# Patient Record
Sex: Female | Born: 1935
Health system: Southern US, Community
[De-identification: ages and names within clinical notes are randomized; demographics above are authoritative.]

## PROBLEM LIST (undated history)

## (undated) DIAGNOSIS — Z85828 Personal history of other malignant neoplasm of skin: Secondary | ICD-10-CM

## (undated) DIAGNOSIS — M1990 Unspecified osteoarthritis, unspecified site: Secondary | ICD-10-CM

## (undated) DIAGNOSIS — E785 Hyperlipidemia, unspecified: Secondary | ICD-10-CM

## (undated) DIAGNOSIS — N390 Urinary tract infection, site not specified: Secondary | ICD-10-CM

## (undated) DIAGNOSIS — N809 Endometriosis, unspecified: Secondary | ICD-10-CM

## (undated) DIAGNOSIS — R944 Abnormal results of kidney function studies: Secondary | ICD-10-CM

## (undated) DIAGNOSIS — L409 Psoriasis, unspecified: Secondary | ICD-10-CM

## (undated) DIAGNOSIS — M069 Rheumatoid arthritis, unspecified: Secondary | ICD-10-CM

## (undated) DIAGNOSIS — I7300 Raynaud's syndrome without gangrene: Secondary | ICD-10-CM

## (undated) DIAGNOSIS — I82409 Acute embolism and thrombosis of unspecified deep veins of unspecified lower extremity: Secondary | ICD-10-CM

## (undated) DIAGNOSIS — I10 Essential (primary) hypertension: Secondary | ICD-10-CM

## (undated) HISTORY — DX: Endometriosis, unspecified: N80.9

## (undated) HISTORY — DX: Rheumatoid arthritis, unspecified: M06.9

## (undated) HISTORY — PX: APPENDECTOMY: SHX54

## (undated) HISTORY — DX: Psoriasis, unspecified: L40.9

## (undated) HISTORY — PX: OTHER SURGICAL HISTORY: SHX169

## (undated) HISTORY — PX: COLONOSCOPY: SHX174

## (undated) HISTORY — PX: TOTAL ABDOMINAL HYSTERECTOMY: SHX209

## (undated) HISTORY — DX: Raynaud's syndrome without gangrene: I73.00

## (undated) HISTORY — DX: Urinary tract infection, site not specified: N39.0

## (undated) HISTORY — DX: Essential (primary) hypertension: I10

## (undated) HISTORY — DX: Hyperlipidemia, unspecified: E78.5

## (undated) HISTORY — DX: Personal history of other malignant neoplasm of skin: Z85.828

## (undated) HISTORY — DX: Abnormal results of kidney function studies: R94.4

## (undated) HISTORY — PX: TONSILLECTOMY: SUR1361

## (undated) HISTORY — PX: CATARACT EXTRACTION: SUR2

## (undated) HISTORY — DX: Unspecified osteoarthritis, unspecified site: M19.90

---

## 1998-12-16 ENCOUNTER — Other Ambulatory Visit: Admission: RE | Admit: 1998-12-16 | Discharge: 1998-12-16 | Payer: Self-pay | Admitting: Obstetrics and Gynecology

## 1999-08-31 ENCOUNTER — Encounter: Payer: Self-pay | Admitting: Obstetrics and Gynecology

## 1999-08-31 ENCOUNTER — Encounter: Admission: RE | Admit: 1999-08-31 | Discharge: 1999-08-31 | Payer: Self-pay | Admitting: Obstetrics and Gynecology

## 2000-04-01 ENCOUNTER — Other Ambulatory Visit: Admission: RE | Admit: 2000-04-01 | Discharge: 2000-04-01 | Payer: Self-pay | Admitting: Obstetrics and Gynecology

## 2001-03-10 ENCOUNTER — Encounter: Admission: RE | Admit: 2001-03-10 | Discharge: 2001-03-10 | Payer: Self-pay | Admitting: Obstetrics and Gynecology

## 2001-03-10 ENCOUNTER — Encounter: Payer: Self-pay | Admitting: Obstetrics and Gynecology

## 2001-05-07 ENCOUNTER — Other Ambulatory Visit: Admission: RE | Admit: 2001-05-07 | Discharge: 2001-05-07 | Payer: Self-pay | Admitting: Obstetrics and Gynecology

## 2002-05-13 ENCOUNTER — Encounter: Payer: Self-pay | Admitting: Internal Medicine

## 2002-05-13 ENCOUNTER — Encounter: Admission: RE | Admit: 2002-05-13 | Discharge: 2002-05-13 | Payer: Self-pay | Admitting: Internal Medicine

## 2002-11-12 LAB — HM COLONOSCOPY

## 2003-06-24 ENCOUNTER — Encounter: Payer: Self-pay | Admitting: Obstetrics and Gynecology

## 2003-06-24 ENCOUNTER — Encounter: Admission: RE | Admit: 2003-06-24 | Discharge: 2003-06-24 | Payer: Self-pay | Admitting: Obstetrics and Gynecology

## 2004-08-14 ENCOUNTER — Encounter: Admission: RE | Admit: 2004-08-14 | Discharge: 2004-08-14 | Payer: Self-pay | Admitting: Obstetrics and Gynecology

## 2004-09-22 ENCOUNTER — Ambulatory Visit: Payer: Self-pay | Admitting: Internal Medicine

## 2004-10-31 ENCOUNTER — Ambulatory Visit: Payer: Self-pay | Admitting: Internal Medicine

## 2004-12-11 ENCOUNTER — Ambulatory Visit: Payer: Self-pay | Admitting: Internal Medicine

## 2004-12-12 ENCOUNTER — Ambulatory Visit: Payer: Self-pay

## 2004-12-25 ENCOUNTER — Encounter: Admission: RE | Admit: 2004-12-25 | Discharge: 2004-12-25 | Payer: Self-pay | Admitting: Internal Medicine

## 2005-08-23 ENCOUNTER — Ambulatory Visit: Payer: Self-pay | Admitting: Internal Medicine

## 2005-12-14 ENCOUNTER — Encounter: Admission: RE | Admit: 2005-12-14 | Discharge: 2005-12-14 | Payer: Self-pay | Admitting: Obstetrics and Gynecology

## 2005-12-31 ENCOUNTER — Ambulatory Visit: Payer: Self-pay | Admitting: Internal Medicine

## 2006-01-04 ENCOUNTER — Encounter: Admission: RE | Admit: 2006-01-04 | Discharge: 2006-01-04 | Payer: Self-pay | Admitting: Obstetrics and Gynecology

## 2006-09-04 ENCOUNTER — Ambulatory Visit: Payer: Self-pay | Admitting: Internal Medicine

## 2007-01-02 ENCOUNTER — Encounter: Admission: RE | Admit: 2007-01-02 | Discharge: 2007-01-02 | Payer: Self-pay | Admitting: Obstetrics and Gynecology

## 2007-03-04 ENCOUNTER — Ambulatory Visit: Payer: Self-pay | Admitting: Internal Medicine

## 2007-03-04 LAB — CONVERTED CEMR LAB
ALT: 22 units/L (ref 0–40)
AST: 23 units/L (ref 0–37)
Albumin: 4.2 g/dL (ref 3.5–5.2)
Alkaline Phosphatase: 76 units/L (ref 39–117)
BUN: 22 mg/dL (ref 6–23)
Basophils Absolute: 0 10*3/uL (ref 0.0–0.1)
Basophils Relative: 0 % (ref 0.0–1.0)
Bilirubin, Direct: 0.1 mg/dL (ref 0.0–0.3)
CO2: 29 meq/L (ref 19–32)
Cholesterol: 172 mg/dL (ref 0–200)
Eosinophils Relative: 1.5 % (ref 0.0–5.0)
GFR calc Af Amer: 70 mL/min
HCT: 46.8 % — ABNORMAL HIGH (ref 36.0–46.0)
HDL: 49.4 mg/dL (ref >39.0)
Hemoglobin: 15.8 g/dL — ABNORMAL HIGH (ref 12.0–15.0)
Hgb A1c MFr Bld: 5.7 % (ref 4.6–6.0)
LDL Cholesterol: 85 mg/dL (ref 0–99)
MCHC: 33.8 g/dL (ref 30.0–36.0)
Monocytes Relative: 8.6 % (ref 3.0–11.0)
Neutro Abs: 3.6 10*3/uL (ref 1.4–7.7)
RDW: 12.2 % (ref 11.5–14.6)
Total Bilirubin: 1.2 mg/dL (ref 0.3–1.2)
Triglycerides: 189 mg/dL — ABNORMAL HIGH (ref 0–149)

## 2007-05-06 ENCOUNTER — Telehealth (INDEPENDENT_AMBULATORY_CARE_PROVIDER_SITE_OTHER): Payer: Self-pay | Admitting: *Deleted

## 2007-09-09 ENCOUNTER — Ambulatory Visit: Payer: Self-pay | Admitting: Internal Medicine

## 2007-09-18 ENCOUNTER — Ambulatory Visit: Payer: Self-pay | Admitting: Internal Medicine

## 2007-11-17 ENCOUNTER — Ambulatory Visit: Payer: Self-pay | Admitting: Internal Medicine

## 2007-11-17 DIAGNOSIS — I10 Essential (primary) hypertension: Secondary | ICD-10-CM | POA: Insufficient documentation

## 2007-11-17 DIAGNOSIS — E785 Hyperlipidemia, unspecified: Secondary | ICD-10-CM | POA: Insufficient documentation

## 2007-11-18 ENCOUNTER — Encounter: Payer: Self-pay | Admitting: Internal Medicine

## 2007-11-18 LAB — CONVERTED CEMR LAB
Total CK: 30 units/L (ref 7–177)
Uric Acid, Serum: 5.1 mg/dL (ref 2.4–7.0)

## 2007-11-27 ENCOUNTER — Encounter: Payer: Self-pay | Admitting: Internal Medicine

## 2008-01-13 ENCOUNTER — Encounter: Payer: Self-pay | Admitting: Internal Medicine

## 2008-02-09 ENCOUNTER — Encounter: Payer: Self-pay | Admitting: Internal Medicine

## 2008-02-25 ENCOUNTER — Ambulatory Visit: Payer: Self-pay | Admitting: Internal Medicine

## 2008-02-25 DIAGNOSIS — M069 Rheumatoid arthritis, unspecified: Secondary | ICD-10-CM

## 2008-02-25 DIAGNOSIS — T887XXA Unspecified adverse effect of drug or medicament, initial encounter: Secondary | ICD-10-CM | POA: Insufficient documentation

## 2008-02-25 LAB — CONVERTED CEMR LAB
Cholesterol, target level: 200 mg/dL
Glucose, Urine, Semiquant: NEGATIVE
HDL goal, serum: 40 mg/dL
LDL Goal: 130 mg/dL
Protein, U semiquant: NEGATIVE
Urobilinogen, UA: NEGATIVE
pH: 5

## 2008-03-01 ENCOUNTER — Encounter (INDEPENDENT_AMBULATORY_CARE_PROVIDER_SITE_OTHER): Payer: Self-pay | Admitting: *Deleted

## 2008-03-01 ENCOUNTER — Encounter: Payer: Self-pay | Admitting: Internal Medicine

## 2008-03-01 LAB — CONVERTED CEMR LAB
AST: 17 units/L (ref 0–37)
Alkaline Phosphatase: 58 units/L (ref 39–117)
BUN: 20 mg/dL (ref 6–23)
Cholesterol: 163 mg/dL (ref 0–200)
Direct LDL: 77.2 mg/dL
Eosinophils Relative: 0.9 % (ref 0.0–5.0)
Hgb A1c MFr Bld: 6 % (ref 4.6–6.0)
MCV: 91.7 fL (ref 78.0–100.0)
Monocytes Absolute: 0.6 10*3/uL (ref 0.1–1.0)
Neutrophils Relative %: 25.5 % — ABNORMAL LOW (ref 43.0–77.0)
Platelets: 293 10*3/uL (ref 150–400)
Potassium: 3.7 meq/L (ref 3.5–5.1)
RBC: 4.54 M/uL (ref 3.87–5.11)
TSH: 2.03 microintl units/mL (ref 0.35–5.50)
Total Bilirubin: 0.7 mg/dL (ref 0.3–1.2)
Triglycerides: 254 mg/dL (ref 0–149)
WBC: 3.4 10*3/uL — ABNORMAL LOW (ref 4.5–10.5)

## 2008-03-02 ENCOUNTER — Encounter (INDEPENDENT_AMBULATORY_CARE_PROVIDER_SITE_OTHER): Payer: Self-pay | Admitting: *Deleted

## 2008-03-03 ENCOUNTER — Encounter: Admission: RE | Admit: 2008-03-03 | Discharge: 2008-03-03 | Payer: Self-pay | Admitting: Obstetrics and Gynecology

## 2008-07-20 ENCOUNTER — Encounter: Payer: Self-pay | Admitting: Internal Medicine

## 2008-08-19 ENCOUNTER — Encounter (INDEPENDENT_AMBULATORY_CARE_PROVIDER_SITE_OTHER): Payer: Self-pay | Admitting: *Deleted

## 2008-09-29 ENCOUNTER — Encounter: Payer: Self-pay | Admitting: Internal Medicine

## 2008-10-05 ENCOUNTER — Encounter: Payer: Self-pay | Admitting: Internal Medicine

## 2008-11-30 ENCOUNTER — Encounter: Payer: Self-pay | Admitting: Internal Medicine

## 2008-12-27 ENCOUNTER — Encounter: Admission: RE | Admit: 2008-12-27 | Discharge: 2008-12-27 | Payer: Self-pay | Admitting: Rheumatology

## 2008-12-29 ENCOUNTER — Encounter: Payer: Self-pay | Admitting: Internal Medicine

## 2009-01-26 ENCOUNTER — Encounter: Payer: Self-pay | Admitting: Internal Medicine

## 2009-03-09 ENCOUNTER — Encounter: Payer: Self-pay | Admitting: Internal Medicine

## 2009-03-11 ENCOUNTER — Encounter (HOSPITAL_COMMUNITY): Admission: RE | Admit: 2009-03-11 | Discharge: 2009-06-09 | Payer: Self-pay | Admitting: Rheumatology

## 2009-03-30 ENCOUNTER — Encounter: Payer: Self-pay | Admitting: Internal Medicine

## 2009-04-21 ENCOUNTER — Encounter: Payer: Self-pay | Admitting: Internal Medicine

## 2009-06-02 ENCOUNTER — Encounter: Payer: Self-pay | Admitting: Internal Medicine

## 2009-06-21 ENCOUNTER — Encounter (HOSPITAL_COMMUNITY): Admission: RE | Admit: 2009-06-21 | Discharge: 2009-09-19 | Payer: Self-pay | Admitting: Rheumatology

## 2009-06-23 ENCOUNTER — Ambulatory Visit: Payer: Self-pay | Admitting: Internal Medicine

## 2009-06-23 DIAGNOSIS — J309 Allergic rhinitis, unspecified: Secondary | ICD-10-CM | POA: Insufficient documentation

## 2009-06-23 DIAGNOSIS — R7303 Prediabetes: Secondary | ICD-10-CM | POA: Insufficient documentation

## 2009-06-23 LAB — CONVERTED CEMR LAB
Cholesterol: 128 mg/dL (ref 0–200)
Hgb A1c MFr Bld: 5.4 % (ref 4.6–6.5)
LDL Cholesterol: 46 mg/dL (ref 0–99)
VLDL: 27.4 mg/dL (ref 0.0–40.0)

## 2009-06-24 ENCOUNTER — Encounter (INDEPENDENT_AMBULATORY_CARE_PROVIDER_SITE_OTHER): Payer: Self-pay | Admitting: *Deleted

## 2009-07-13 ENCOUNTER — Encounter: Admission: RE | Admit: 2009-07-13 | Discharge: 2009-07-13 | Payer: Self-pay | Admitting: Obstetrics and Gynecology

## 2009-08-08 ENCOUNTER — Encounter: Payer: Self-pay | Admitting: Internal Medicine

## 2009-09-20 ENCOUNTER — Encounter (HOSPITAL_COMMUNITY): Admission: RE | Admit: 2009-09-20 | Discharge: 2009-12-19 | Payer: Self-pay | Admitting: Rheumatology

## 2009-12-15 ENCOUNTER — Encounter: Payer: Self-pay | Admitting: Internal Medicine

## 2010-01-17 ENCOUNTER — Encounter (HOSPITAL_COMMUNITY): Admission: RE | Admit: 2010-01-17 | Discharge: 2010-04-17 | Payer: Self-pay | Admitting: Rheumatology

## 2010-04-20 ENCOUNTER — Encounter: Payer: Self-pay | Admitting: Internal Medicine

## 2010-05-09 ENCOUNTER — Encounter (HOSPITAL_COMMUNITY): Admission: RE | Admit: 2010-05-09 | Discharge: 2010-08-07 | Payer: Self-pay | Admitting: Rheumatology

## 2010-06-26 ENCOUNTER — Ambulatory Visit: Payer: Self-pay | Admitting: Internal Medicine

## 2010-06-26 DIAGNOSIS — Z85828 Personal history of other malignant neoplasm of skin: Secondary | ICD-10-CM

## 2010-06-27 LAB — CONVERTED CEMR LAB
AST: 20 units/L (ref 0–37)
Albumin: 4.1 g/dL (ref 3.5–5.2)
Bilirubin, Direct: 0.2 mg/dL (ref 0.0–0.3)
CO2: 28 meq/L (ref 19–32)
Calcium: 9.5 mg/dL (ref 8.4–10.5)
Cholesterol: 152 mg/dL (ref 0–200)
Eosinophils Relative: 0.5 % (ref 0.0–5.0)
Glucose, Bld: 73 mg/dL (ref 70–99)
Lymphocytes Relative: 32 % (ref 12.0–46.0)
Lymphs Abs: 2.2 10*3/uL (ref 0.7–4.0)
Monocytes Relative: 9.9 % (ref 3.0–12.0)
Platelets: 228 10*3/uL (ref 150.0–400.0)
RDW: 14.5 % (ref 11.5–14.6)
Sodium: 143 meq/L (ref 135–145)
TSH: 3.55 microintl units/mL (ref 0.35–5.50)
Total CHOL/HDL Ratio: 2
VLDL: 35.4 mg/dL (ref 0.0–40.0)

## 2010-06-30 ENCOUNTER — Telehealth (INDEPENDENT_AMBULATORY_CARE_PROVIDER_SITE_OTHER): Payer: Self-pay | Admitting: *Deleted

## 2010-07-19 ENCOUNTER — Encounter: Admission: RE | Admit: 2010-07-19 | Discharge: 2010-07-19 | Payer: Self-pay | Admitting: Obstetrics and Gynecology

## 2010-08-19 ENCOUNTER — Encounter: Payer: Self-pay | Admitting: Internal Medicine

## 2010-09-20 ENCOUNTER — Encounter (HOSPITAL_COMMUNITY)
Admission: RE | Admit: 2010-09-20 | Discharge: 2010-12-12 | Payer: Self-pay | Source: Home / Self Care | Attending: Rheumatology | Admitting: Rheumatology

## 2010-10-26 ENCOUNTER — Encounter: Payer: Self-pay | Admitting: Internal Medicine

## 2010-12-12 NOTE — Progress Notes (Signed)
Summary: Medication Concerns   Phone Note Call from Patient Call back at Home Phone [phone]   Summary of Call: Patient called stating that she is very concerned that her cholesterol readings went up. Patient received letter noting for her to decrease the Lipitor and patient is not comfortable decreasing the med, she denies have any symptoms/issues with the Lipitor. Patient is all worked up about this issue and does not want to decrease her Lipitor. Please advise.  Initial call taken by: Lucious Groves CMA,  June 30, 2010 9:42 AM  Follow-up for Phone Call        THe LDL or BAD cholesterol  does not  to be this low;less than 100 is goal, but  optimal range is < 70. She does not have to  decrease Lipitor , but when it is this low(54), I usually decrease the cholesterol medication dose. We doctors never hesitate to increase meds if values are high ,but we are slow to decrease them when you get such a low value.  Follow-up by: Marga Melnick MD,  June 30, 2010 1:01 PM  Additional Follow-up for Phone Call Additional follow up Details #1::        lmtcb.Marland KitchenMarland KitchenHarold Barban  June 30, 2010 1:55 PM pt return call........Marland KitchenFelecia Deloach CMA  July 03, 2010 10:47 AM     Additional Follow-up for Phone Call Additional follow up Details #2::    Left message on machine for patient to return call when avaliable, Reason for call:  Discuss Dr.Hopper's response./Chrae [facility] CMA  July 03, 2010 4:24 PM    I spoke with patient and she said she has so much going on in her life, family death and infusions monthy at the hospital for RA, patient indicated that she will continue meds as they were and call to schedule an appointment when things smooth over in her life to futher discuss./Chrae [facility] CMA  July 04, 2010 8:58 AM   New/Updated Medications: LIPITOR 20 MG  TABS (ATORVASTATIN CALCIUM) 1/2 by mouth once daily

## 2010-12-12 NOTE — Miscellaneous (Signed)
Summary: Flu Vaccine / [facility]  Flu Vaccine / [facility]   Imported By: Lennie Odor 09/15/2010 16:14:27  _____________________________________________________________________  External Attachment:    Type:   Image     Comment:   External Document

## 2010-12-12 NOTE — Assessment & Plan Note (Signed)
Summary: cpx/kdc   Vital Signs:  Patient profile:   77 year old female Height:      61.4 inches Weight:      164.6 pounds BMI:     30.81 Temp:     97.8 degrees F oral Pulse rate:   72 / minute Resp:     14 per minute BP sitting:   122 / 70  (left arm) Cuff size:   large  Vitals Entered By: Shonna Chock CMA (June 26, 2010 10:12 AM) CC: Yearly with fasting labs , Lipid Management Comments Patient has to check with Rheumatologist before updating ANY vaccines, patient to see if ok to get Pneumonia and TDap   CC:  Yearly with fasting labs  and Lipid Management.  History of Present Illness: Here for Medicare AWV: 1.Risk factors based on Past M, S, F history: RA ( on Orencia, low dose steroids & Plaquenil),HTN, Dyslipidemia( chart updated) 2.Physical Activities: see data 3.Depression/mood:issues  denied 4.Hearing: decreased to whisper @ 6 ft 5.ADL's: no limitations 6.Fall Risk: knees stiff  but no balance issues 7.Home Safety: none  8.Height, weight, &visual acuity: OD lens for near vision ; OS lens for distant  9.Counseling: none requested 10.Labs ordered based on risk factors: see Orders 11. Referral Coordination: [facility] referral recommended 12. Care Plan: see Instructions 13. Cognitive Assessment: Oriented X 3; memory & recall  good  ; "WORLD" spelled backwards; mood & affect normal. Hyperlipidemia Follow-Up      This is a 77 year old woman who presents for Hyperlipidemia follow-up.  The patient reports some  muscle aches( but mainly joint pain) and fatigue, but denies GI upset, abdominal pain, flushing, itching, constipation, and diarrhea.  The patient denies the following symptoms: chest pain/pressure, exercise intolerance, dypsnea, palpitations, syncope, and pedal edema.  Compliance with medications (by patient report) has been near 100%.  Dietary compliance has been excellent.  The patient reports no exercise.   Hypertension Follow-Up      The patient also presents for  Hypertension follow-up.  The patient denies lightheadedness, urinary frequency, headaches, and rash.  Compliance with medications (by patient report) has been near 100%.  Adjunctive measures currently used by the patient include salt restriction.  BP averages 115-118/70s.  Lipid Management History:      Positive NCEP/ATP III risk factors include female age 59 years old or older, family history for ischemic heart disease (males less than 23 years old), and hypertension.  Negative NCEP/ATP III risk factors include no history of early menopause without estrogen hormone replacement, non-diabetic, non-tobacco-user status, no ASHD (atherosclerotic heart disease), no prior stroke/TIA, no peripheral vascular disease, and no history of aortic aneurysm.     Preventive Screening-Counseling & Management  Alcohol-Tobacco     Alcohol drinks/day: rarely     Smoking Status: never  Caffeine-Diet-Exercise     Caffeine use/day: 2 cups/ day      Diet Comments: heart healthy diet     Does Patient Exercise: no due to RA  Hep-HIV-STD-Contraception     Dental Visit-last 6 months yes     Sun Exposure-Excessive: no  Safety-Violence-Falls     Seat Belt Use: yes     Firearms in the Home: no firearms in the home     Smoke Detectors: yes     Violence in the Home: no risk noted     Sexual Abuse: no     Fall Risk: RA with knee stiffness      Sexual History:  currently monogamous.  Drug Use:  never.        Blood Transfusions:  no.        Travel History:  none in > 12 months.    Current Medications (verified): 1)  Benazepril Hcl 20 Mg  Tabs (Benazepril Hcl) .Marland Kitchen.. 1 By Mouth Qd 2)  Ogen 0.625 0.75 Mg  Tabs (Estropipate) .Marland Kitchen.. 1 By Mouth Qd 3)  Fexofenadine Hcl 180 Mg  Tabs (Fexofenadine Hcl) .Marland Kitchen.. 1 By Mouth Qd 4)  Lipitor 20 Mg  Tabs (Atorvastatin Calcium) .... 1/2 By Mouth Once Daily 5)  Prednisone 10 Mg  Tabs (Prednisone) .... Weaning in A Taper, Currently On 5mg  6)  Caltrate 600 .Marland Kitchen.. 1 By Mouth Two  Times A Day 7)  Vit D 1000mg  .... 1 By Mouth Two Times A Day 8)  Plaquenil 200 Mg Tabs (Hydroxychloroquine Sulfate) .Marland Kitchen.. 1 By Mouth Two Times A Day 9)  Orencia 250 Mg Solr (Abatacept) .... Infusion Every 4 Weeks  Allergies: 1)  ! Sulfa 2)  ! Arava (Leflunomide)  Past History:  Past Medical History: HYPERTENSION (ICD-401.9) HYPERLIPIDEMIA (ICD-272.4): Framingham Study LDL goal = < 130. Rheumatoid arthritis with elevated CCP, Dr Corliss Skains Allergic rhinitis Skin cancer, hx of, Squamous Cell, 11/2009  Past Surgical History: Hysterectomy  & appendectomy @ age 66 for Endometriosis( no BSO) Tonsillectomy gravid 2 para 2 Colonoscopy negative X 2  Family History: mother: breast cancer, htn, died MI  @ age 74; gout maternal aunt: severe diabetes( mother's twin) father: htn, MI died  @ age 53 daughter: C3-5 stenosis s/p fusion 09/2005, AVM with  subarachnoid hemorrhage died 46 son: htn  Social History: Caffeine use/day:  2 cups/ day  Does Patient Exercise:  no due to RA Dental Care w/in 6 mos.:  yes Sun Exposure-Excessive:  no Seat Belt Use:  yes Fall Risk:  RA with knee stiffness Sexual History:  currently monogamous Drug Use:  never Blood Transfusions:  no  Review of Systems  The patient denies anorexia, fever, hoarseness, melena, hematochezia, severe indigestion/heartburn, hematuria, incontinence, unusual weight change, abnormal bleeding, enlarged lymph nodes, and angioedema.    Physical Exam  General:  well-nourished,in no acute distress; alert,appropriate and cooperative throughout examination Head:  Normocephalic and atraumatic without obvious abnormalities. Eyes:  No corneal or conjunctival inflammation noted. Perrla. Funduscopic exam benign, without hemorrhages, exudates or papilledema.  Ears:  External ear exam shows no significant lesions or deformities.  Otoscopic examination reveals clear canals, tympanic membranes are intact bilaterally without bulging,  retraction, inflammation or discharge. Hearing is grossly normal bilaterally. Nose:  External nasal examination shows no deformity or inflammation. Nasal mucosa are pink and moist without lesions or exudates. Mouth:  Oral mucosa and oropharynx without lesions or exudates.  Teeth in good repair. Neck:  No deformities, masses, or tenderness noted. Lungs:  Normal respiratory effort, chest expands symmetrically. Lungs are clear to auscultation, no crackles or wheezes. Heart:  Normal rate and regular rhythm. S1 and S2 normal without gallop, murmur, click, rub .S4 Abdomen:  Bowel sounds positive,abdomen soft and non-tender without masses, organomegaly or hernias noted. Genitalia:  Dr Tresa Res Msk:  Lordosis upper thoracic spine Pulses:  R and L carotid,radial,dorsalis pedis and posterior tibial pulses are full and equal bilaterally Extremities:  No clubbing, cyanosis, edema. Classic RA hand deformities  noted . Crepitus of knees Neurologic:  alert & oriented X3 and DTRs symmetrical and normal.   Skin:  Intact without suspicious lesions or rashes Cervical Nodes:  No lymphadenopathy noted Axillary Nodes:  No palpable  lymphadenopathy Psych:  Oriented X3, memory intact for recent and remote, normally interactive, good eye contact, not anxious appearing, and not depressed appearing.     Impression & Recommendations:  Problem # 1:  PREVENTIVE HEALTH CARE (ICD-V70.0)  Orders: [facility] -Subsequent Annual Wellness Visit [phone])  Problem # 2:  HYPERTENSION (ICD-401.9)  controlled Her updated medication list for this problem includes:    Benazepril Hcl 20 Mg Tabs (Benazepril hcl) .Marland Kitchen... 1 by mouth qd  Orders: EKG w/ Interpretation (93000) Venipuncture (60454) TLB-BMP (Basic Metabolic Panel-BMET) (80048-METABOL)  Problem # 3:  HYPERLIPIDEMIA (ICD-272.4)  Her updated medication list for this problem includes:    Lipitor 20 Mg Tabs (Atorvastatin calcium) .Marland Kitchen... 1/2 by mouth once  daily  Orders: Venipuncture (09811) TLB-Lipid Panel (80061-LIPID) TLB-Hepatic/Liver Function Pnl (80076-HEPATIC) TLB-TSH (Thyroid Stimulating Hormone) (84443-TSH)  Problem # 4:  FASTING HYPERGLYCEMIA (ICD-790.29)  Orders: TLB-A1C / Hgb A1C (Glycohemoglobin) (83036-A1C)  Problem # 5:  RHEUMATOID ARTHRITIS (ICD-714.0)  as per Dr Corliss Skains  Orders: Venipuncture (91478)  Complete Medication List: 1)  Benazepril Hcl 20 Mg Tabs (Benazepril hcl) .Marland Kitchen.. 1 by mouth qd 2)  Ogen 0.625 0.75 Mg Tabs (Estropipate) .Marland Kitchen.. 1 by mouth qd 3)  Fexofenadine Hcl 180 Mg Tabs (Fexofenadine hcl) .Marland Kitchen.. 1 by mouth qd 4)  Lipitor 20 Mg Tabs (Atorvastatin calcium) .... 1/2 by mouth once daily 5)  Prednisone 10 Mg Tabs (Prednisone) .... Weaning in a taper, currently on 5mg  6)  Caltrate 600  .Marland KitchenMarland Kitchen. 1 by mouth two times a day 7)  Vit D 1000mg   .... 1 by mouth two times a day 8)  Plaquenil 200 Mg Tabs (Hydroxychloroquine sulfate) .Marland Kitchen.. 1 by mouth two times a day 9)  Orencia 250 Mg Solr (Abatacept) .... Infusion every 4 weeks  Other Orders: TLB-CBC Platelet - w/Differential (85025-CBCD)  Lipid Assessment/Plan:      Based on NCEP/ATP III, the patient's risk factor category is "0-1 risk factors".  The patient's lipid goals are as follows: Total cholesterol goal is 200; LDL cholesterol goal is 130; HDL cholesterol goal is 40; Triglyceride goal is 150.  Her LDL cholesterol goal has been met.    Patient Instructions: 1)  Consider Water Aerobics for exercise 3-4X/week. 2)  It is important that your Diabetic A1c level is checked every 6-12 months due to Prednisone. 3)  Check your Blood Pressure regularly. If it is above: 140/90 ON AVERAGE you should make an appointment. Prescriptions: LIPITOR 20 MG  TABS (ATORVASTATIN CALCIUM) 1/2 by mouth once daily  #45 x 3   Entered and Authorized by:   Marga Melnick MD   Signed by:   Marga Melnick MD on 06/26/2010   Method used:   Print then Give to Patient   RxID:    [id number] BENAZEPRIL HCL 20 MG  TABS (BENAZEPRIL HCL) 1 by mouth qd  #90 x 3   Entered and Authorized by:   Marga Melnick MD   Signed by:   Marga Melnick MD on 06/26/2010   Method used:   Print then Give to Patient   RxID:   [id number]

## 2010-12-12 NOTE — Letter (Signed)
Summary: [facility]  [facility]   Imported By: Lennie Odor 05/09/2010 14:01:12  _____________________________________________________________________  External Attachment:    Type:   Image     Comment:   External Document

## 2010-12-12 NOTE — Letter (Signed)
Summary: [facility]  [facility]   Imported By: Lanelle Bal 01/05/2010 09:04:08  _____________________________________________________________________  External Attachment:    Type:   Image     Comment:   External Document

## 2010-12-14 NOTE — Letter (Signed)
Summary: [facility]  [facility]   Imported By: Lanelle Bal 11/24/2010 12:37:47  _____________________________________________________________________  External Attachment:    Type:   Image     Comment:   External Document

## 2010-12-18 ENCOUNTER — Ambulatory Visit (HOSPITAL_COMMUNITY): Payer: Medicare Other | Attending: Rheumatology

## 2010-12-18 DIAGNOSIS — M069 Rheumatoid arthritis, unspecified: Secondary | ICD-10-CM | POA: Insufficient documentation

## 2011-01-16 ENCOUNTER — Encounter (HOSPITAL_COMMUNITY): Payer: Medicare Other | Attending: Rheumatology

## 2011-01-16 DIAGNOSIS — M069 Rheumatoid arthritis, unspecified: Secondary | ICD-10-CM | POA: Insufficient documentation

## 2011-02-13 ENCOUNTER — Encounter (HOSPITAL_COMMUNITY): Payer: Medicare Other | Attending: Rheumatology

## 2011-02-13 DIAGNOSIS — M069 Rheumatoid arthritis, unspecified: Secondary | ICD-10-CM | POA: Insufficient documentation

## 2011-03-13 ENCOUNTER — Ambulatory Visit (HOSPITAL_COMMUNITY): Payer: Medicare Other | Attending: Nephrology

## 2011-03-13 ENCOUNTER — Encounter (HOSPITAL_COMMUNITY): Payer: Medicare Other

## 2011-03-13 DIAGNOSIS — M069 Rheumatoid arthritis, unspecified: Secondary | ICD-10-CM | POA: Insufficient documentation

## 2011-03-30 NOTE — Assessment & Plan Note (Signed)
[facility]                        [place] OFFICE NOTE   Gloria Warren, Gloria Warren                    MRN:          [id number]  DATE:03/04/2007                            DOB:          1931-02-21    New history reveals a fall approximately 5 weeks ago on  stairs,sustaining chest and rib injuries.  She has had gradual  improvement, and has no respiratory compromise.  She did not lose  consciousness.  She was not seen at the time of the accident.  She is  [facility]  She does  not supplement with calcium.   Other past medical history includes hysterectomy at age 71, ovaries  remain.  She is followed by Dr. Tresa Res, with an appt in May.  She has  had tonsillectomy.  Two pregnancies and 2 deliveries.  Medical problems  include hypertension, dyslipidemia,  rhinoconjunctivitis, and a history  of abnormal thyroid tests in the past.   Her mother had breast cancer, borderline diabetes, hypertension, and  died of a heart attack at age 67.  Her mother's twin had severe  diabetes.  Her father had hypertension and heart attack at 34.  Her  daughter had cervical stenosis; unfortunately she died of subarachnoid  hemorrhage at age 14.  Her son has hypertension.   She has never smoked.  She drinks rarely.  She has decreased her  exercise due to the musculoskeletal injuries she sustained.   She is presently on:  1. Allegra 180 mg daily.  2. Ogen 0.625 mg daily.  3. Benazepril 20 mg daily.  4. Lipitor 10 mg daily.   SHE IS INTOLERANT TO SULFA AND PRINIVIL.   She is 5 feet 3-1/2.  Weight is 171 fully clothed, up approximately 3  pounds.  Pulse is 68.  Respiratory rate 14.  Blood pressure 129/78.  She does have arterial lower narrowing on fundal exam.  The pupils are  widely dilated.  Dental hygiene is excellent. Otolaryngologic exam is  unremarkable.  The thyroid is normal to palpation.  She has an S4  with slurring versus a grade 1-1/2 systolic murmur.  CHEST:  Clear.  There is no increased work of breathing.  She has no organomegaly or mass.  There is no lymphadenopathy about her head, neck or axillae.  Crepitus was noted in her knees.  She does not have significant DJD  changes in her hands.  There is a rubor of the hands, and they are  somewhat cool.  Radial artery pulses are normal.  The pedal pulses are  also normal, and she has no edema.  NEUROLOGIC/PSYCHIATRIC:  Exam was unremarkable.   Her EKG shows diffuse nonspecific ST-T wave changes.  There has actually  been an improvement in the T voltage V4 through V6 since February 2007.   Fasting labs will be obtained including an A1c because of glucose of  106.  Triglycerides have been elevated, and carbohydrate restriction  such as prevention.com web site, The [employer] Diet will be  recommended.  Additional recommendations are pending return of the lab  studies.  Titus Dubin. Alwyn Ren, MD,FACP,FCCP  Electronically Signed    [facility]  DD: 03/04/2007  DT: 03/04/2007  Job #: [id number]

## 2011-04-10 ENCOUNTER — Encounter (HOSPITAL_COMMUNITY): Payer: Medicare Other | Attending: Rheumatology

## 2011-04-10 DIAGNOSIS — M069 Rheumatoid arthritis, unspecified: Secondary | ICD-10-CM | POA: Insufficient documentation

## 2011-05-08 ENCOUNTER — Encounter (HOSPITAL_COMMUNITY): Payer: Medicare Other | Attending: Rheumatology

## 2011-05-08 DIAGNOSIS — M069 Rheumatoid arthritis, unspecified: Secondary | ICD-10-CM | POA: Insufficient documentation

## 2011-06-05 ENCOUNTER — Encounter (HOSPITAL_COMMUNITY): Payer: Medicare Other | Attending: Rheumatology

## 2011-06-05 DIAGNOSIS — M069 Rheumatoid arthritis, unspecified: Secondary | ICD-10-CM | POA: Insufficient documentation

## 2011-06-19 ENCOUNTER — Ambulatory Visit (INDEPENDENT_AMBULATORY_CARE_PROVIDER_SITE_OTHER): Payer: Medicare Other | Admitting: Internal Medicine

## 2011-06-19 ENCOUNTER — Encounter: Payer: Self-pay | Admitting: Internal Medicine

## 2011-06-19 DIAGNOSIS — E785 Hyperlipidemia, unspecified: Secondary | ICD-10-CM

## 2011-06-19 DIAGNOSIS — T887XXA Unspecified adverse effect of drug or medicament, initial encounter: Secondary | ICD-10-CM

## 2011-06-19 DIAGNOSIS — R198 Other specified symptoms and signs involving the digestive system and abdomen: Secondary | ICD-10-CM

## 2011-06-19 DIAGNOSIS — M069 Rheumatoid arthritis, unspecified: Secondary | ICD-10-CM

## 2011-06-19 LAB — CBC WITH DIFFERENTIAL/PLATELET
Eosinophils Relative: 0 % (ref 0.0–5.0)
HCT: 46.1 % — ABNORMAL HIGH (ref 36.0–46.0)
Hemoglobin: 15.2 g/dL — ABNORMAL HIGH (ref 12.0–15.0)
Monocytes Relative: 7.4 % (ref 3.0–12.0)
Neutro Abs: 9.8 10*3/uL — ABNORMAL HIGH (ref 1.4–7.7)
Neutrophils Relative %: 83.5 % — ABNORMAL HIGH (ref 43.0–77.0)
Platelets: 241 10*3/uL (ref 150.0–400.0)
RBC: 4.55 Mil/uL (ref 3.87–5.11)
RDW: 14.1 % (ref 11.5–14.6)

## 2011-06-19 LAB — HEPATIC FUNCTION PANEL
ALT: 13 U/L (ref 0–35)
AST: 11 U/L (ref 0–37)
Albumin: 4.1 g/dL (ref 3.5–5.2)
Alkaline Phosphatase: 63 U/L (ref 39–117)
Bilirubin, Direct: <0.1 mg/dL (ref 0.0–0.3)
Total Protein: 7.5 g/dL (ref 6.0–8.3)

## 2011-06-19 LAB — TSH: TSH: 1.1 u[IU]/mL (ref 0.35–5.50)

## 2011-06-19 NOTE — Patient Instructions (Signed)
Keep a food diary if symptoms persist or progress. Look  for food triggers such as wheat or lactose(milk sugar) as factors. Take  Align daily until bowels are normal. Until the  results returned; hold the statin drug.

## 2011-06-19 NOTE — Progress Notes (Signed)
  Subjective:    Patient ID: Gloria Warren, female    DOB: 11-05-1931, 77 y.o.   MRN: [id number]  HPI Bowel changes: Onset: 06/14/11   Quality: mucus on stool & loose stool  Progression:stable  Better with: Pepto- Bismol   Worse with: no triggers Symptoms Nausea/Vomiting: no  Diarrhea: no  Constipation: no  Melena/BRBPR: no  Anorexia: yes, due to Plaquenil  Fever/Chills: yes, low grade fever 8/6 & ? For 2-3 days  Dysuria: no  Wt loss: no  EtOH use: no  NSAIDs/ASA: no  Past Surgeries: colonoscopy negative "years ago" [id number] for GI disease       Review of Systems  she is on low-dose prednisone, parenteral Orencia  and Plaquenil for rheumatoid arthritis; these are prescribed by Dr. Corliss Skains. She questions the role of Plaquenil in her GI symptoms.     Objective:   Physical Exam General appearance is one of good health and nourishment w/o distress.  Eyes: No conjunctival inflammation or scleral icterus is present.  Oral exam: Dental hygiene is good; lips and gums are healthy appearing.There is no oropharyngeal erythema or exudate noted.   Neck:  No deformities, thyromegaly, masses, or tenderness noted.   Supple with full range of motion without pain.   Heart:  Normal rate and regular rhythm. S1 and S2 normal without gallop, murmur, click, rub .S4   Lungs:Chest clear to auscultation; no wheezes, rhonchi,rales ,or rubs present.No increased work of breathing.   Abdomen: bowel sounds normal, soft and non-tender without masses, organomegaly or hernias noted.  No guarding or rebound   Skin:Warm & dry.  Intact without suspicious lesions or rashes ; no jaundice or tenting   Musculoskeletal/Extremities:  No cyanosis, edema  noted . Classic RA hand & feet changes.  Upper thoracic spine lordosis  Lymphatic: No lymphadenopathy is noted about the head, neck, axilla, or inguinal areas.              Assessment & Plan:  #1 bowel changes; loose stool with increased  mucus  #2 rheumatoid arthritis; on maintenance immunosuppressive drugs  #3 dyslipidemia; on statin  Plan: See orders and recommendations.

## 2011-06-19 NOTE — Progress Notes (Signed)
  Subjective:    Patient ID: Gloria Warren, female    DOB: 02-19-1931, 77 y.o.   MRN: [id number]  HPI    Review of Systems     Objective:   Physical Exam    Small external hemorrhoidal tags were noted; rectal vault was empty. Hemoccult testing was negative.          Assessment & Plan:  If fever and GI symptoms persist or progress; GI referral would be indicated.

## 2011-06-25 ENCOUNTER — Telehealth: Payer: Self-pay | Admitting: Internal Medicine

## 2011-06-25 NOTE — Telephone Encounter (Signed)
Pt called says cholesterol medication was stopped due to GI issues from pt informed of labs but would like to know if she should start medication. Pls advised.

## 2011-06-25 NOTE — Telephone Encounter (Signed)
Pt aware of instructions

## 2011-06-25 NOTE — Telephone Encounter (Signed)
Resume cholesterol meds once GI symptoms resolved

## 2011-06-26 ENCOUNTER — Telehealth: Payer: Self-pay | Admitting: Internal Medicine

## 2011-06-26 MED ORDER — CIPROFLOXACIN HCL 500 MG PO TABS: 500.0000 mg | ORAL_TABLET | Freq: Two times a day (BID) | ORAL | Status: DC

## 2011-06-26 MED ORDER — METRONIDAZOLE 250 MG PO TABS: 250.0000 mg | ORAL_TABLET | Freq: Three times a day (TID) | ORAL | Status: DC

## 2011-06-26 NOTE — Telephone Encounter (Signed)
Spoke w/ pt says she is concerned because she will due for rheumatoid arthritis infusion in 10 days and says that she has been running a low grade temp of 99.0. Says she doesn't have any diarrhea just mucous in stool feels like that with these and that white count being slightly elevated that infection could be something to consider which is why she would like antibiotic. Says she will not be well if she is unable to get infusion due to symptoms.

## 2011-06-26 NOTE — Telephone Encounter (Signed)
Spoke w/ pt says that she feels like GI symptoms are better but since it seems to still be lingering but would like for Dr. Alwyn Ren to call in antibiotic says a week is long enough. Informed pt that she may need stool studies prior to medication but says she doesn't think this is necessary. Pls advise.

## 2011-06-26 NOTE — Telephone Encounter (Signed)
Spoke w/ pt aware medication sent to pharmacy and if no better GI referral

## 2011-06-26 NOTE — Telephone Encounter (Signed)
Generic Cipro 500 mg bid #10  & Metronidazole 250 mg tid #15

## 2011-06-26 NOTE — Telephone Encounter (Signed)
Antibiotics tend to cause diarrhea; what med is she discussing

## 2011-07-02 ENCOUNTER — Telehealth: Payer: Self-pay | Admitting: Internal Medicine

## 2011-07-02 MED ORDER — METRONIDAZOLE 250 MG PO TABS: 250.0000 mg | ORAL_TABLET | Freq: Three times a day (TID) | ORAL | Status: AC

## 2011-07-02 MED ORDER — CIPROFLOXACIN HCL 500 MG PO TABS: 500.0000 mg | ORAL_TABLET | Freq: Two times a day (BID) | ORAL | Status: AC

## 2011-07-02 NOTE — Telephone Encounter (Signed)
Pt called says she had spoke w/ pharmacy and was told that medication can be taken for 7 days and would like to have 2 additional days of antibiotic called in to pharmacy to take for symptoms. Ok for additional?

## 2011-07-02 NOTE — Telephone Encounter (Signed)
Ok

## 2011-07-02 NOTE — Telephone Encounter (Signed)
Pt informed medication sent to pharmacy and per Hop if no better then she needs to go to GI.

## 2011-07-03 ENCOUNTER — Encounter (HOSPITAL_COMMUNITY): Payer: Medicare Other

## 2011-07-18 ENCOUNTER — Encounter (HOSPITAL_COMMUNITY): Payer: Medicare Other | Attending: Rheumatology

## 2011-07-18 DIAGNOSIS — M069 Rheumatoid arthritis, unspecified: Secondary | ICD-10-CM | POA: Insufficient documentation

## 2011-08-15 ENCOUNTER — Encounter (HOSPITAL_COMMUNITY): Payer: Medicare Other | Attending: Rheumatology

## 2011-08-15 DIAGNOSIS — M069 Rheumatoid arthritis, unspecified: Secondary | ICD-10-CM | POA: Insufficient documentation

## 2011-09-03 ENCOUNTER — Other Ambulatory Visit: Payer: Self-pay | Admitting: Obstetrics and Gynecology

## 2011-09-03 DIAGNOSIS — Z1231 Encounter for screening mammogram for malignant neoplasm of breast: Secondary | ICD-10-CM

## 2011-09-11 ENCOUNTER — Encounter (HOSPITAL_COMMUNITY): Payer: Medicare Other

## 2011-09-19 ENCOUNTER — Ambulatory Visit
Admission: RE | Admit: 2011-09-19 | Discharge: 2011-09-19 | Disposition: A | Discharge status: Home / Self Care | Payer: Medicare Other | Source: Ambulatory Visit | Attending: Obstetrics and Gynecology | Admitting: Obstetrics and Gynecology

## 2011-09-19 DIAGNOSIS — Z1231 Encounter for screening mammogram for malignant neoplasm of breast: Secondary | ICD-10-CM

## 2011-09-24 ENCOUNTER — Encounter: Payer: Self-pay | Admitting: Internal Medicine

## 2011-09-25 ENCOUNTER — Encounter: Payer: Self-pay | Admitting: Internal Medicine

## 2011-09-25 ENCOUNTER — Ambulatory Visit (INDEPENDENT_AMBULATORY_CARE_PROVIDER_SITE_OTHER): Payer: Medicare Other | Admitting: Internal Medicine

## 2011-09-25 DIAGNOSIS — R7309 Other abnormal glucose: Secondary | ICD-10-CM

## 2011-09-25 DIAGNOSIS — I10 Essential (primary) hypertension: Secondary | ICD-10-CM

## 2011-09-25 LAB — HEMOGLOBIN A1C: Hgb A1c MFr Bld: 5.5 % (ref 4.6–6.5)

## 2011-09-25 MED ORDER — BENAZEPRIL HCL 20 MG PO TABS: ORAL_TABLET | ORAL | Status: DC

## 2011-09-25 MED ORDER — ATORVASTATIN CALCIUM 20 MG PO TABS: ORAL_TABLET | ORAL | Status: DC

## 2011-09-25 NOTE — Patient Instructions (Signed)
BUN, creatinine, and GFR  all assess kidney function. To protect the kidneys it  is important to control your blood pressure and sugar. You should also stay well hydrated. Drink to thirst, up to 40 ounces of fluids a day. Progressive kidney impairment is typically associated with anemia which is unrelated to  iron deficiency or B12 deficiency.

## 2011-09-25 NOTE — Progress Notes (Signed)
  Subjective:    Patient ID: Gloria Warren, female    DOB: 06/27/1931, 77 y.o.   MRN: [id number]  HPI Lab results from her rheumatologist were brought in for review. On 9/13 and creatinine was mildly elevated at 1.26. Her AST was 22 and ALT 16. Her MCV was 102.9 but no anemia was present.  On 10/24 non fasting glucose was 106 creatinine 1.08, GFR 49 and BUN 28. Liver functions tests remained normal.  The glucose of 106 was nonfasting. Her A1c has never been higher than 6; it was 5.5% in August of 2001  Her blood pressures have been somewhat low with an average of approximately 116/67. These values are verified at various physician offices& @ the hospital.    Review of Systems     Objective:   Physical Exam   She appears healthy and well-nourished.  She has an S4 without significant murmurs; no dysrhythmias were present  Chest is clear with no increased work of breathing normal  She has no organomegaly; there is no aortic aneurysm or renal artery bruits present.  All pulses are intact.  She has no  cyanosis clubbing or edema.        Assessment & Plan:  #1 hypertension, excellent control  #2 mild elevation of creatinine, which resolved  #3 minimal nonfasting hyperglycemia  Plan: The lisinopril can be decreased to one half pill daily with monitoring of blood pressure. Goal would be an average less than 135/85. She should drink to thirst, up to 40 ounces of fluids a day.

## 2011-10-08 ENCOUNTER — Other Ambulatory Visit (HOSPITAL_COMMUNITY): Payer: Self-pay | Admitting: *Deleted

## 2011-10-09 ENCOUNTER — Encounter (HOSPITAL_COMMUNITY): Payer: Medicare Other

## 2011-10-16 ENCOUNTER — Encounter (HOSPITAL_COMMUNITY)
Admission: RE | Admit: 2011-10-16 | Discharge: 2011-10-16 | Disposition: A | Discharge status: Home / Self Care | Payer: Medicare Other | Source: Ambulatory Visit | Attending: Rheumatology | Admitting: Rheumatology

## 2011-10-16 DIAGNOSIS — M069 Rheumatoid arthritis, unspecified: Secondary | ICD-10-CM | POA: Insufficient documentation

## 2011-10-16 MED ORDER — SODIUM CHLORIDE 0.9 % IV SOLN
750.0000 mg | INTRAVENOUS | Status: DC
  Administered 2011-10-16: 750 mg via INTRAVENOUS
  Filled 2011-10-16: qty 30

## 2011-11-01 ENCOUNTER — Ambulatory Visit: Payer: Medicare Other | Admitting: Internal Medicine

## 2011-11-01 ENCOUNTER — Encounter: Payer: Self-pay | Admitting: Internal Medicine

## 2011-11-01 ENCOUNTER — Ambulatory Visit (INDEPENDENT_AMBULATORY_CARE_PROVIDER_SITE_OTHER): Payer: Medicare Other | Admitting: Internal Medicine

## 2011-11-01 VITALS — BP 120/76 | HR 86 | Temp 98.3°F | Resp 14 | Ht 61.0 in | Wt 156.6 lb

## 2011-11-01 DIAGNOSIS — I10 Essential (primary) hypertension: Secondary | ICD-10-CM

## 2011-11-01 DIAGNOSIS — E785 Hyperlipidemia, unspecified: Secondary | ICD-10-CM

## 2011-11-01 DIAGNOSIS — R7309 Other abnormal glucose: Secondary | ICD-10-CM

## 2011-11-01 DIAGNOSIS — Z0000 Encounter for general adult medical examination without abnormal findings: Secondary | ICD-10-CM

## 2011-11-01 DIAGNOSIS — Z23 Encounter for immunization: Secondary | ICD-10-CM

## 2011-11-01 LAB — LIPID PANEL
HDL: 68.9 mg/dL (ref >39.00)
Total CHOL/HDL Ratio: 2
VLDL: 23.8 mg/dL (ref 0.0–40.0)

## 2011-11-01 LAB — BASIC METABOLIC PANEL
GFR: 47.96 mL/min — ABNORMAL LOW (ref >60.00)
Potassium: 3.6 mEq/L (ref 3.5–5.1)
Sodium: 144 mEq/L (ref 135–145)

## 2011-11-01 LAB — HEPATIC FUNCTION PANEL
Alkaline Phosphatase: 74 U/L (ref 39–117)
Bilirubin, Direct: 0.2 mg/dL (ref 0.0–0.3)
Total Bilirubin: 1 mg/dL (ref 0.3–1.2)

## 2011-11-01 LAB — HEMOGLOBIN A1C: Hgb A1c MFr Bld: 5.5 % (ref 4.6–6.5)

## 2011-11-01 LAB — TSH: TSH: 1.75 u[IU]/mL (ref 0.35–5.50)

## 2011-11-01 NOTE — Assessment & Plan Note (Signed)
Blood pressure is well controlled; chemistries will be checked.

## 2011-11-01 NOTE — Patient Instructions (Addendum)
Preventive Health Care: Exercise  30-45  minutes a day, 3-4 days a week. Walking is especially valuable in preventing Osteoporosis. Eat a low-fat diet with lots of fruits and vegetables, up to 7-9 servings per day. Consume less than 30 grams of sugar per day from foods & drinks with High Fructose Corn Syrup as # 1,2,3 or #4 on label.  Bone density will be ordered @  the [facility] Center. Please tell me when you are ready to see the Audiologist to evaluate your  hearing loss.  .Share results with  All MDs seen

## 2011-11-01 NOTE — Assessment & Plan Note (Signed)
A1c will be performed to assess glucose status.

## 2011-11-01 NOTE — Progress Notes (Signed)
Subjective:    Patient ID: Gloria Warren, female    DOB: 1930-12-01, 77 y.o.   MRN: [id number]  HPI Medicare Wellness Visit:  The following psychosocial & medical history were reviewed as required by Medicare.   Social history: caffeine: 2 cups coffee/ day , alcohol:  Rare wine ,  tobacco use : never  & exercise : no due to RA.   Home & personal  safety / fall risk: no issues, activities of daily living: no limitations but now back spasms as of 1 week ago , seatbelt use : yes , and smoke alarm employment : yes .  Power of Attorney/Living Will status : in place  Vision ( as recorded per Nurse) & Hearing  evaluation :  Ophth exam 6/12, due next month. Hearing loss to be evaluated when appt possible Orientation :oriented X 3 , memory & recall  good:, spelling  testing: good,and mood & affect : normal . Depression / anxiety: denied Travel history : last 2007 [place] , immunization status :Pneumovax today , transfusion history:  no, and preventive health surveillance ( colonoscopies, BMD , etc as per protocol/ [facility]): colonoscopy up to date, Dental care:  Every 6 months . Chart reviewed &  Updated. Active issues reviewed & addressed.       Review of Systems CHRONIC HYPERTENSION Disease Monitoring  Blood pressure range: < 121/70  Chest pain: no   Dyspnea: no   Claudication: no   Medication compliance: yes,   Medication Side Effects  Lightheadedness: no   Urinary frequency: no   Edema: no    Preventitive Healthcare:  Diet Pattern: low fat  Salt Restriction: yes  She is on a muscle relaxant for her low back symptoms which occurred after overuse while Christmas shopping last week.      Objective:   Physical Exam Gen.: Healthy and well-nourished in appearance. Alert, appropriate and cooperative throughout exam. Head: Normocephalic without obvious abnormalities   Eyes: No corneal or conjunctival inflammation noted. Pupils equal round reactive to light and accommodation.  Extraocular  motion intact.  Ears: External  ear exam reveals no significant lesions or deformities. Canals clear .TMs normal. Hearing is grossly decreased Nose: External nasal exam reveals no deformity or inflammation. Nasal mucosa are pink and moist. No lesions or exudates noted.  Mouth: Oral mucosa and oropharynx reveal no lesions or exudates. Teeth in good repair. Neck: No deformities, masses, or tenderness noted.  Thyroid normal. Lungs: Normal respiratory effort; chest expands symmetrically. Lungs are clear to auscultation without rales, wheezes, or increased work of breathing. Heart: Normal rate and rhythm. Normal S1 and S2. No gallop, click, or rub.  S 4 w/o  murmur. Abdomen: Bowel sounds normal; abdomen soft and nontender. No masses, organomegaly or hernias noted. Genitalia: Dr Tresa Res   .                                                                                   Musculoskeletal/extremities: Mild lordosis noted of  the thoracic  spine. She exhibits the classic "low back crawl" lying back on the exam table and sitting up. She has rheumatoid changes in the hands with some ulnar deviation, and interosseous  wasting, and PIP fusiform enlargement in an  isolated fashion. Varus changes are noted in the knees. Straight leg raising is negative. Strength is excellent in the lower extremities  Neurologic: Alert and oriented x3. Deep tendon reflexes symmetrical and normal.          Skin: Intact without suspicious lesions or rashes. Lymph: No cervical, axillary lymphadenopathy present. Psych: Mood and affect are normal. Normally interactive                                                                                         Assessment & Plan:  #1 Medicare Wellness Exam; criteria met ; data entered #2 Problem List reviewed ; Assessment/ Recommendations made Plan: see Orders

## 2011-11-08 ENCOUNTER — Encounter (HOSPITAL_COMMUNITY): Payer: Medicare Other

## 2011-11-14 ENCOUNTER — Encounter (HOSPITAL_COMMUNITY)
Admission: RE | Admit: 2011-11-14 | Discharge: 2011-11-14 | Disposition: A | Discharge status: Home / Self Care | Payer: Medicare Other | Source: Ambulatory Visit | Attending: Rheumatology | Admitting: Rheumatology

## 2011-11-14 DIAGNOSIS — M069 Rheumatoid arthritis, unspecified: Secondary | ICD-10-CM | POA: Insufficient documentation

## 2011-11-14 MED ORDER — SODIUM CHLORIDE 0.9 % IV SOLN
750.0000 mg | INTRAVENOUS | Status: DC
  Administered 2011-11-14: 750 mg via INTRAVENOUS
  Filled 2011-11-14: qty 30

## 2011-11-25 ENCOUNTER — Other Ambulatory Visit: Payer: Self-pay | Admitting: Internal Medicine

## 2011-12-05 ENCOUNTER — Encounter (HOSPITAL_COMMUNITY): Payer: Medicare Other

## 2011-12-10 ENCOUNTER — Other Ambulatory Visit (HOSPITAL_COMMUNITY): Payer: Self-pay | Admitting: *Deleted

## 2011-12-11 ENCOUNTER — Encounter (HOSPITAL_COMMUNITY)
Admission: RE | Admit: 2011-12-11 | Discharge: 2011-12-11 | Disposition: A | Discharge status: Home / Self Care | Payer: Medicare Other | Source: Ambulatory Visit | Attending: Rheumatology | Admitting: Rheumatology

## 2011-12-11 MED ORDER — SODIUM CHLORIDE 0.9 % IV SOLN
750.0000 mg | INTRAVENOUS | Status: DC
  Administered 2011-12-11: 750 mg via INTRAVENOUS
  Filled 2011-12-11: qty 30

## 2011-12-12 ENCOUNTER — Encounter (HOSPITAL_COMMUNITY): Payer: Medicare Other

## 2012-01-08 ENCOUNTER — Encounter (HOSPITAL_COMMUNITY)
Admission: RE | Admit: 2012-01-08 | Discharge: 2012-01-08 | Disposition: A | Discharge status: Home / Self Care | Payer: Medicare Other | Source: Ambulatory Visit | Attending: Rheumatology | Admitting: Rheumatology

## 2012-01-08 DIAGNOSIS — M069 Rheumatoid arthritis, unspecified: Secondary | ICD-10-CM | POA: Insufficient documentation

## 2012-01-08 MED ORDER — SODIUM CHLORIDE 0.9 % IV SOLN
750.0000 mg | INTRAVENOUS | Status: DC
  Administered 2012-01-08: 750 mg via INTRAVENOUS
  Filled 2012-01-08: qty 30

## 2012-01-28 ENCOUNTER — Other Ambulatory Visit: Payer: Self-pay | Admitting: Internal Medicine

## 2012-02-04 ENCOUNTER — Other Ambulatory Visit (HOSPITAL_COMMUNITY): Payer: Self-pay | Admitting: *Deleted

## 2012-02-05 ENCOUNTER — Encounter (HOSPITAL_COMMUNITY)
Admission: RE | Admit: 2012-02-05 | Discharge: 2012-02-05 | Disposition: A | Discharge status: Home / Self Care | Payer: Medicare Other | Source: Ambulatory Visit | Attending: Rheumatology | Admitting: Rheumatology

## 2012-02-05 ENCOUNTER — Encounter (HOSPITAL_COMMUNITY): Payer: Medicare Other

## 2012-02-05 DIAGNOSIS — M069 Rheumatoid arthritis, unspecified: Secondary | ICD-10-CM | POA: Insufficient documentation

## 2012-02-05 MED ORDER — SODIUM CHLORIDE 0.9 % IV SOLN
Freq: Once | INTRAVENOUS | Status: AC
  Administered 2012-02-05: 11:00:00 via INTRAVENOUS

## 2012-02-05 MED ORDER — SODIUM CHLORIDE 0.9 % IV SOLN
750.0000 mg | INTRAVENOUS | Status: AC
  Administered 2012-02-05: 750 mg via INTRAVENOUS
  Filled 2012-02-05: qty 30

## 2012-03-04 ENCOUNTER — Encounter (HOSPITAL_COMMUNITY)
Admission: RE | Admit: 2012-03-04 | Discharge: 2012-03-04 | Disposition: A | Discharge status: Home / Self Care | Payer: Medicare Other | Source: Ambulatory Visit | Attending: Rheumatology | Admitting: Rheumatology

## 2012-03-04 DIAGNOSIS — M069 Rheumatoid arthritis, unspecified: Secondary | ICD-10-CM | POA: Insufficient documentation

## 2012-03-04 MED ORDER — SODIUM CHLORIDE 0.9 % IV SOLN
750.0000 mg | INTRAVENOUS | Status: DC
  Administered 2012-03-04: 750 mg via INTRAVENOUS
  Filled 2012-03-04: qty 30

## 2012-03-04 MED ORDER — SODIUM CHLORIDE 0.9 % IV SOLN
INTRAVENOUS | Status: DC
  Administered 2012-03-04: 10:00:00 via INTRAVENOUS

## 2012-04-01 ENCOUNTER — Encounter (HOSPITAL_COMMUNITY)
Admission: RE | Admit: 2012-04-01 | Discharge: 2012-04-01 | Disposition: A | Discharge status: Home / Self Care | Payer: Medicare Other | Source: Ambulatory Visit | Attending: Rheumatology | Admitting: Rheumatology

## 2012-04-01 DIAGNOSIS — M069 Rheumatoid arthritis, unspecified: Secondary | ICD-10-CM | POA: Insufficient documentation

## 2012-04-01 MED ORDER — SODIUM CHLORIDE 0.9 % IV SOLN
750.0000 mg | INTRAVENOUS | Status: DC
  Administered 2012-04-01: 750 mg via INTRAVENOUS
  Filled 2012-04-01: qty 30

## 2012-04-01 MED ORDER — SODIUM CHLORIDE 0.9 % IV SOLN
INTRAVENOUS | Status: DC
  Administered 2012-04-01: 11:00:00 via INTRAVENOUS

## 2012-04-25 ENCOUNTER — Other Ambulatory Visit (HOSPITAL_COMMUNITY): Payer: Self-pay | Admitting: *Deleted

## 2012-04-29 ENCOUNTER — Encounter (HOSPITAL_COMMUNITY): Payer: Medicare Other

## 2012-05-07 ENCOUNTER — Encounter (HOSPITAL_COMMUNITY)
Admission: RE | Admit: 2012-05-07 | Discharge: 2012-05-07 | Disposition: A | Discharge status: Home / Self Care | Payer: Medicare Other | Source: Ambulatory Visit | Attending: Rheumatology | Admitting: Rheumatology

## 2012-05-07 DIAGNOSIS — M069 Rheumatoid arthritis, unspecified: Secondary | ICD-10-CM | POA: Insufficient documentation

## 2012-05-07 MED ORDER — SODIUM CHLORIDE 0.9 % IV SOLN
750.0000 mg | INTRAVENOUS | Status: DC
  Administered 2012-05-07: 750 mg via INTRAVENOUS
  Filled 2012-05-07: qty 30

## 2012-05-07 MED ORDER — SODIUM CHLORIDE 0.9 % IV SOLN
INTRAVENOUS | Status: DC
Start: 2012-05-07 — End: 2012-05-08
  Administered 2012-05-07: 250 mL via INTRAVENOUS

## 2012-06-03 ENCOUNTER — Encounter (HOSPITAL_COMMUNITY)
Admission: RE | Admit: 2012-06-03 | Discharge: 2012-06-03 | Disposition: A | Discharge status: Home / Self Care | Payer: Medicare Other | Source: Ambulatory Visit | Attending: Rheumatology | Admitting: Rheumatology

## 2012-06-03 DIAGNOSIS — M069 Rheumatoid arthritis, unspecified: Secondary | ICD-10-CM | POA: Insufficient documentation

## 2012-06-03 MED ORDER — SODIUM CHLORIDE 0.9 % IV SOLN
750.0000 mg | INTRAVENOUS | Status: AC
  Administered 2012-06-03: 750 mg via INTRAVENOUS
  Filled 2012-06-03: qty 30

## 2012-06-03 MED ORDER — SODIUM CHLORIDE 0.9 % IV SOLN: INTRAVENOUS | Status: DC

## 2012-06-30 ENCOUNTER — Other Ambulatory Visit (HOSPITAL_COMMUNITY): Payer: Self-pay | Admitting: *Deleted

## 2012-07-01 ENCOUNTER — Encounter (HOSPITAL_COMMUNITY)
Admission: RE | Admit: 2012-07-01 | Discharge: 2012-07-01 | Disposition: A | Discharge status: Home / Self Care | Payer: Medicare Other | Source: Ambulatory Visit | Attending: Rheumatology | Admitting: Rheumatology

## 2012-07-01 DIAGNOSIS — M069 Rheumatoid arthritis, unspecified: Secondary | ICD-10-CM | POA: Insufficient documentation

## 2012-07-01 MED ORDER — SODIUM CHLORIDE 0.9 % IV SOLN: 750.0000 mg | INTRAVENOUS | Status: DC

## 2012-07-01 MED ORDER — SODIUM CHLORIDE 0.9 % IV SOLN
INTRAVENOUS | Status: AC
  Administered 2012-07-01: 12:00:00 via INTRAVENOUS

## 2012-07-01 MED ORDER — SODIUM CHLORIDE 0.9 % IV SOLN
750.0000 mg | INTRAVENOUS | Status: AC
  Administered 2012-07-01: 750 mg via INTRAVENOUS
  Filled 2012-07-01: qty 30

## 2012-07-01 MED ORDER — SODIUM CHLORIDE 0.9 % IV SOLN: INTRAVENOUS | Status: DC

## 2012-07-29 ENCOUNTER — Encounter (HOSPITAL_COMMUNITY)
Admission: RE | Admit: 2012-07-29 | Discharge: 2012-07-29 | Disposition: A | Discharge status: Home / Self Care | Payer: Medicare Other | Source: Ambulatory Visit | Attending: Rheumatology | Admitting: Rheumatology

## 2012-07-29 DIAGNOSIS — M069 Rheumatoid arthritis, unspecified: Secondary | ICD-10-CM | POA: Insufficient documentation

## 2012-07-29 MED ORDER — SODIUM CHLORIDE 0.9 % IV SOLN: 750.0000 mg | INTRAVENOUS | Status: DC

## 2012-07-29 MED ORDER — SODIUM CHLORIDE 0.9 % IV SOLN
INTRAVENOUS | Status: AC
  Administered 2012-07-29: 250 mL via INTRAVENOUS

## 2012-07-29 MED ORDER — SODIUM CHLORIDE 0.9 % IV SOLN
750.0000 mg | Freq: Once | INTRAVENOUS | Status: AC
  Administered 2012-07-29: 750 mg via INTRAVENOUS
  Filled 2012-07-29: qty 30

## 2012-08-08 ENCOUNTER — Ambulatory Visit (INDEPENDENT_AMBULATORY_CARE_PROVIDER_SITE_OTHER): Payer: Medicare Other

## 2012-08-08 DIAGNOSIS — Z23 Encounter for immunization: Secondary | ICD-10-CM

## 2012-08-26 ENCOUNTER — Encounter (HOSPITAL_COMMUNITY)
Admission: RE | Admit: 2012-08-26 | Discharge: 2012-08-26 | Disposition: A | Discharge status: Home / Self Care | Payer: Medicare Other | Source: Ambulatory Visit | Attending: Rheumatology | Admitting: Rheumatology

## 2012-08-26 DIAGNOSIS — M069 Rheumatoid arthritis, unspecified: Secondary | ICD-10-CM | POA: Insufficient documentation

## 2012-08-26 MED ORDER — SODIUM CHLORIDE 0.9 % IV SOLN
Freq: Once | INTRAVENOUS | Status: AC
  Administered 2012-08-26: 12:00:00 via INTRAVENOUS

## 2012-08-26 MED ORDER — SODIUM CHLORIDE 0.9 % IV SOLN
750.0000 mg | INTRAVENOUS | Status: DC
  Administered 2012-08-26: 750 mg via INTRAVENOUS
  Filled 2012-08-26: qty 30

## 2012-09-13 ENCOUNTER — Other Ambulatory Visit: Payer: Self-pay | Admitting: Internal Medicine

## 2012-09-15 NOTE — Telephone Encounter (Signed)
Patient needs to schedule a CPX  

## 2012-09-19 ENCOUNTER — Other Ambulatory Visit: Payer: Self-pay | Admitting: Obstetrics and Gynecology

## 2012-09-19 DIAGNOSIS — Z1231 Encounter for screening mammogram for malignant neoplasm of breast: Secondary | ICD-10-CM

## 2012-09-23 ENCOUNTER — Encounter (HOSPITAL_COMMUNITY)
Admission: RE | Admit: 2012-09-23 | Discharge: 2012-09-23 | Disposition: A | Discharge status: Home / Self Care | Payer: Medicare Other | Source: Ambulatory Visit | Attending: Rheumatology | Admitting: Rheumatology

## 2012-09-23 DIAGNOSIS — M069 Rheumatoid arthritis, unspecified: Secondary | ICD-10-CM | POA: Insufficient documentation

## 2012-09-23 MED ORDER — SODIUM CHLORIDE 0.9 % IV SOLN
INTRAVENOUS | Status: DC
  Administered 2012-09-23: 10:00:00 via INTRAVENOUS

## 2012-09-23 MED ORDER — SODIUM CHLORIDE 0.9 % IV SOLN
250.0000 mg | INTRAVENOUS | Status: DC
  Administered 2012-09-23: 250 mg via INTRAVENOUS
  Filled 2012-09-23 (×2): qty 10

## 2012-10-01 ENCOUNTER — Other Ambulatory Visit: Payer: Self-pay | Admitting: Obstetrics and Gynecology

## 2012-10-01 DIAGNOSIS — Z780 Asymptomatic menopausal state: Secondary | ICD-10-CM

## 2012-10-13 ENCOUNTER — Other Ambulatory Visit: Payer: Self-pay | Admitting: Internal Medicine

## 2012-10-13 NOTE — Telephone Encounter (Signed)
Patient needs to schedule a CPX  

## 2012-10-20 ENCOUNTER — Other Ambulatory Visit (HOSPITAL_COMMUNITY): Payer: Self-pay | Admitting: *Deleted

## 2012-10-21 ENCOUNTER — Encounter (HOSPITAL_COMMUNITY)
Admission: RE | Admit: 2012-10-21 | Discharge: 2012-10-21 | Disposition: A | Discharge status: Home / Self Care | Payer: Medicare Other | Source: Ambulatory Visit | Attending: Rheumatology | Admitting: Rheumatology

## 2012-10-21 DIAGNOSIS — M069 Rheumatoid arthritis, unspecified: Secondary | ICD-10-CM | POA: Insufficient documentation

## 2012-10-21 MED ORDER — SODIUM CHLORIDE 0.9 % IV SOLN
INTRAVENOUS | Status: DC
  Administered 2012-10-21: 11:00:00 via INTRAVENOUS

## 2012-10-21 MED ORDER — SODIUM CHLORIDE 0.9 % IV SOLN: 250.0000 mg | INTRAVENOUS | Status: DC

## 2012-10-21 MED ORDER — SODIUM CHLORIDE 0.9 % IV SOLN: INTRAVENOUS | Status: DC

## 2012-10-21 MED ORDER — SODIUM CHLORIDE 0.9 % IV SOLN
750.0000 mg | INTRAVENOUS | Status: DC
  Administered 2012-10-21: 750 mg via INTRAVENOUS
  Filled 2012-10-21: qty 30

## 2012-10-28 ENCOUNTER — Ambulatory Visit
Admission: RE | Admit: 2012-10-28 | Discharge: 2012-10-28 | Disposition: A | Discharge status: Home / Self Care | Payer: Medicare Other | Source: Ambulatory Visit | Attending: Obstetrics and Gynecology | Admitting: Obstetrics and Gynecology

## 2012-10-28 DIAGNOSIS — Z1231 Encounter for screening mammogram for malignant neoplasm of breast: Secondary | ICD-10-CM

## 2012-10-28 LAB — HM MAMMOGRAPHY

## 2012-11-18 ENCOUNTER — Encounter (HOSPITAL_COMMUNITY)
Admission: RE | Admit: 2012-11-18 | Discharge: 2012-11-18 | Disposition: A | Discharge status: Home / Self Care | Payer: Medicare Other | Source: Ambulatory Visit | Attending: Rheumatology | Admitting: Rheumatology

## 2012-11-18 DIAGNOSIS — M069 Rheumatoid arthritis, unspecified: Secondary | ICD-10-CM | POA: Insufficient documentation

## 2012-11-18 MED ORDER — SODIUM CHLORIDE 0.9 % IV SOLN
INTRAVENOUS | Status: DC
  Administered 2012-11-18: 250 mL via INTRAVENOUS

## 2012-11-18 MED ORDER — SODIUM CHLORIDE 0.9 % IV SOLN
750.0000 mg | INTRAVENOUS | Status: DC
  Administered 2012-11-18: 750 mg via INTRAVENOUS
  Filled 2012-11-18: qty 30

## 2012-12-01 ENCOUNTER — Ambulatory Visit
Admission: RE | Admit: 2012-12-01 | Discharge: 2012-12-01 | Disposition: A | Discharge status: Home / Self Care | Payer: Medicare Other | Source: Ambulatory Visit | Attending: Obstetrics and Gynecology | Admitting: Obstetrics and Gynecology

## 2012-12-01 DIAGNOSIS — Z780 Asymptomatic menopausal state: Secondary | ICD-10-CM

## 2012-12-16 ENCOUNTER — Encounter (HOSPITAL_COMMUNITY)
Admission: RE | Admit: 2012-12-16 | Discharge: 2012-12-16 | Disposition: A | Discharge status: Home / Self Care | Payer: Medicare Other | Source: Ambulatory Visit | Attending: Rheumatology | Admitting: Rheumatology

## 2012-12-16 DIAGNOSIS — M069 Rheumatoid arthritis, unspecified: Secondary | ICD-10-CM | POA: Insufficient documentation

## 2012-12-16 MED ORDER — BACITRACIN ZINC 500 UNIT/GM EX OINT
TOPICAL_OINTMENT | CUTANEOUS | Status: AC
  Filled 2012-12-16: qty 15

## 2012-12-16 MED ORDER — SODIUM CHLORIDE 0.9 % IV SOLN
INTRAVENOUS | Status: DC
  Administered 2012-12-16: 11:00:00 via INTRAVENOUS

## 2012-12-16 MED ORDER — SODIUM CHLORIDE 0.9 % IV SOLN
750.0000 mg | INTRAVENOUS | Status: DC
  Administered 2012-12-16: 750 mg via INTRAVENOUS
  Filled 2012-12-16: qty 30

## 2012-12-17 ENCOUNTER — Other Ambulatory Visit: Payer: Self-pay | Admitting: Internal Medicine

## 2012-12-17 MED ORDER — BENAZEPRIL HCL 20 MG PO TABS: ORAL_TABLET | ORAL | Status: DC

## 2012-12-17 NOTE — Telephone Encounter (Signed)
Pt's husband came in and needs rx for benazepril hcl 20 mg qty:45  [facility] and not to [facility].  Pls call [phone] to confirm that it is done.

## 2012-12-17 NOTE — Telephone Encounter (Signed)
Left message on VM informing Gloria Warren rx was sent in  Pending appointment 02/2013

## 2013-01-13 ENCOUNTER — Encounter (HOSPITAL_COMMUNITY)
Admission: RE | Admit: 2013-01-13 | Discharge: 2013-01-13 | Disposition: A | Discharge status: Home / Self Care | Payer: Medicare Other | Source: Ambulatory Visit | Attending: Rheumatology | Admitting: Rheumatology

## 2013-01-13 DIAGNOSIS — M069 Rheumatoid arthritis, unspecified: Secondary | ICD-10-CM | POA: Insufficient documentation

## 2013-01-13 MED ORDER — SODIUM CHLORIDE 0.9 % IV SOLN
INTRAVENOUS | Status: DC
  Administered 2013-01-13: 250 mL via INTRAVENOUS

## 2013-01-13 MED ORDER — SODIUM CHLORIDE 0.9 % IV SOLN
750.0000 mg | INTRAVENOUS | Status: DC
  Administered 2013-01-13: 750 mg via INTRAVENOUS
  Filled 2013-01-13: qty 30

## 2013-02-10 ENCOUNTER — Encounter (HOSPITAL_COMMUNITY)
Admission: RE | Admit: 2013-02-10 | Discharge: 2013-02-10 | Disposition: A | Discharge status: Home / Self Care | Payer: Medicare Other | Source: Ambulatory Visit | Attending: Rheumatology | Admitting: Rheumatology

## 2013-02-10 ENCOUNTER — Telehealth: Payer: Self-pay | Admitting: Internal Medicine

## 2013-02-10 DIAGNOSIS — M069 Rheumatoid arthritis, unspecified: Secondary | ICD-10-CM | POA: Insufficient documentation

## 2013-02-10 MED ORDER — SODIUM CHLORIDE 0.9 % IV SOLN
INTRAVENOUS | Status: DC
  Administered 2013-02-10: 11:00:00 via INTRAVENOUS

## 2013-02-10 MED ORDER — ATORVASTATIN CALCIUM 20 MG PO TABS: ORAL_TABLET | ORAL | Status: DC

## 2013-02-10 MED ORDER — ABATACEPT 250 MG IV SOLR
750.0000 mg | INTRAVENOUS | Status: DC
  Administered 2013-02-10: 750 mg via INTRAVENOUS
  Filled 2013-02-10: qty 30

## 2013-02-10 NOTE — Telephone Encounter (Signed)
Pt came in needs refill on rx ATORVASTATIN 20 mg [facility] [address], is out and needs ASAP.

## 2013-02-10 NOTE — Telephone Encounter (Signed)
RX sent electronically. 1 month supply given (last Lipid check was 2012), patient with pending appointment 03/11/13

## 2013-03-10 ENCOUNTER — Encounter (HOSPITAL_COMMUNITY)
Admission: RE | Admit: 2013-03-10 | Discharge: 2013-03-10 | Disposition: A | Discharge status: Home / Self Care | Payer: Medicare Other | Source: Ambulatory Visit | Attending: Rheumatology | Admitting: Rheumatology

## 2013-03-10 MED ORDER — SODIUM CHLORIDE 0.9 % IV SOLN
INTRAVENOUS | Status: DC
  Administered 2013-03-10: 10:00:00 via INTRAVENOUS

## 2013-03-10 MED ORDER — SODIUM CHLORIDE 0.9 % IV SOLN
750.0000 mg | INTRAVENOUS | Status: DC
  Administered 2013-03-10: 750 mg via INTRAVENOUS
  Filled 2013-03-10: qty 30

## 2013-03-11 ENCOUNTER — Encounter: Payer: Self-pay | Admitting: Internal Medicine

## 2013-03-11 ENCOUNTER — Ambulatory Visit (INDEPENDENT_AMBULATORY_CARE_PROVIDER_SITE_OTHER): Payer: Medicare Other | Admitting: Internal Medicine

## 2013-03-11 VITALS — BP 130/72 | HR 85 | Temp 97.6°F | Resp 14 | Ht 60.5 in | Wt 145.0 lb

## 2013-03-11 DIAGNOSIS — Z0000 Encounter for general adult medical examination without abnormal findings: Secondary | ICD-10-CM

## 2013-03-11 DIAGNOSIS — L409 Psoriasis, unspecified: Secondary | ICD-10-CM | POA: Insufficient documentation

## 2013-03-11 DIAGNOSIS — E785 Hyperlipidemia, unspecified: Secondary | ICD-10-CM

## 2013-03-11 DIAGNOSIS — R7309 Other abnormal glucose: Secondary | ICD-10-CM

## 2013-03-11 DIAGNOSIS — I10 Essential (primary) hypertension: Secondary | ICD-10-CM

## 2013-03-11 DIAGNOSIS — T50995A Adverse effect of other drugs, medicaments and biological substances, initial encounter: Secondary | ICD-10-CM

## 2013-03-11 LAB — BASIC METABOLIC PANEL
Calcium: 8.9 mg/dL (ref 8.4–10.5)
Creatinine, Ser: 1 mg/dL (ref 0.4–1.2)
GFR: 54.82 mL/min — ABNORMAL LOW (ref >60.00)
Glucose, Bld: 84 mg/dL (ref 70–99)
Sodium: 140 mEq/L (ref 135–145)

## 2013-03-11 LAB — CBC WITH DIFFERENTIAL/PLATELET
Basophils Absolute: 0 10*3/uL (ref 0.0–0.1)
Eosinophils Absolute: 0 10*3/uL (ref 0.0–0.7)
Hemoglobin: 15 g/dL (ref 12.0–15.0)
Lymphocytes Relative: 25 % (ref 12.0–46.0)
Monocytes Relative: 8 % (ref 3.0–12.0)
Neutro Abs: 4.7 10*3/uL (ref 1.4–7.7)
Neutrophils Relative %: 66.5 % (ref 43.0–77.0)
RDW: 13.2 % (ref 11.5–14.6)

## 2013-03-11 LAB — LIPID PANEL
HDL: 61.1 mg/dL (ref >39.00)
LDL Cholesterol: 41 mg/dL (ref 0–99)
Total CHOL/HDL Ratio: 2
Triglycerides: 124 mg/dL (ref 0.0–149.0)
VLDL: 24.8 mg/dL (ref 0.0–40.0)

## 2013-03-11 LAB — HEMOGLOBIN A1C: Hgb A1c MFr Bld: 5.6 % (ref 4.6–6.5)

## 2013-03-11 LAB — HEPATIC FUNCTION PANEL
Alkaline Phosphatase: 51 U/L (ref 39–117)
Bilirubin, Direct: 0.1 mg/dL (ref 0.0–0.3)
Total Bilirubin: 1.2 mg/dL (ref 0.3–1.2)
Total Protein: 6.3 g/dL (ref 6.0–8.3)

## 2013-03-11 LAB — TSH: TSH: 2.74 u[IU]/mL (ref 0.35–5.50)

## 2013-03-11 MED ORDER — ATORVASTATIN CALCIUM 20 MG PO TABS: ORAL_TABLET | ORAL | Status: DC

## 2013-03-11 MED ORDER — BENAZEPRIL HCL 10 MG PO TABS: ORAL_TABLET | ORAL | Status: DC

## 2013-03-11 MED ORDER — BENAZEPRIL HCL 20 MG PO TABS: ORAL_TABLET | ORAL | Status: DC

## 2013-03-11 MED ORDER — ATORVASTATIN CALCIUM 10 MG PO TABS: ORAL_TABLET | ORAL | Status: DC

## 2013-03-11 NOTE — Progress Notes (Signed)
Subjective:    Patient ID: Gloria Warren, female    DOB: [birth year], 79 y.o.   MRN: [id number]  HPI  Medicare Wellness Visit:  Psychosocial & medical history were reviewed as required by Medicare (abuse,antisocial behavioral risks,firearm risk).  Social history: caffeine: 1-2 cups coffee/ day  , alcohol:rarely   ,  tobacco use:  never  Exercise : limited due to RA   No home & personal  safety / fall risk Activities of daily living: no limitations  Seatbelt  and smoke alarm employed. Power of Attorney/Living Will status : in place Ophthalmology exam current Hearing evaluation not current Orientation :oriented X 3  Memory & recall :good Spelling testing:good Mood & affect : normal . Depression / anxiety: denied Travel history :  never  Immunization status : unable to take Shingles; tetanus to be discussed with Dr Corliss Skains Transfusion history:  none  Preventive health surveillance ( colonoscopy, BMD , mammograms as per protocol/ [facility]): current Dental care:  Every 6 mos. Chart reviewed &  Updated. Active issues reviewed & addressed.      Review of Systems HYPERTENSION: Disease Monitoring: Blood pressure range-18/58-127/62  Chest pain, palpitations-no      Dyspnea- no Medications: Compliance- yes  Lightheadedness,Syncope- no   Edema-no  PMH ELEVATED FBS: Disease Monitoring: Blood Sugar ranges-no monitor Polyuria/phagia/dipsia- no      Visual problems- no Medications: on Prednisone 5 mg daily for RA  HYPERLIPIDEMIA: Disease Monitoring: See symptoms for Hypertension Medications: Compliance- yes  Abd pain, bowel changes- no   Muscle aches- occasionally        Objective:   Physical Exam Gen.: Healthy and well-nourished in appearance. Alert, appropriate and cooperative throughout exam. Appears younger than stated age  Head: Normocephalic without obvious abnormalities Eyes: No corneal or conjunctival inflammation noted. Cataracts bilaterally. Extraocular  motion intact. Vision slightly decreased OS even with with lenses . Ears: External  ear exam reveals no significant lesions or deformities. Canals clear .TMs normal. Hearing is grossly decreased bilaterally. Nose: External nasal exam reveals no deformity or inflammation. Nasal mucosa are pink and moist. No lesions or exudates noted.  Mouth: Oral mucosa and oropharynx reveal no lesions or exudates. Teeth in good repair. Neck: No deformities, masses, or tenderness noted. Range of motion & Thyroid normal. Lungs: Normal respiratory effort; chest expands symmetrically. Lungs are clear to auscultation without rales, wheezes, or increased work of breathing. Heart: Normal rate and rhythm. Normal S1 and S2. No gallop, click, or rub. S4 w/o murmur. Abdomen: Bowel sounds normal; abdomen soft and nontender. No masses, organomegaly or hernias noted. Genitalia: As per Dr Belva Chimes                                  Musculoskeletal/extremities: Accentuated curvature of upper thoracic  Spine. No clubbing, cyanosis, or edema noted. Range of motion normal .Tone & strength  Normal. Joints reveal significant PIP & MCP RA changes. Nail health good. Able to lie down & sit up w/o help. Negative SLR bilaterally Vascular: Carotid, radial artery, dorsalis pedis and  posterior tibial pulses are full and equal. No bruits present. Neurologic: Alert and oriented x3. Deep tendon reflexes symmetrical and normal.         Skin: Intact but scattered Psoriatic lesions Lymph: No cervical, axillary lymphadenopathy present. Psych: Mood and affect are normal. Normally interactive  Assessment & Plan:  #1 Medicare Wellness Exam; criteria met ; data entered #2 Problem List reviewed ; Assessment/ Recommendations made Plan: see Orders

## 2013-03-11 NOTE — Patient Instructions (Addendum)
If you activate the  My Chart system; lab & Xray results will be released directly  to you as soon as I review & address these through the computer. If you choose not to sign up for My Chart within 36 hours of labs being drawn; results will be reviewed & interpretation added before being copied & mailed, causing a delay in getting the results to you.If you do not receive that report within 7-10 days ,please call. Additionally you can use this system to gain direct  access to your records  if  out of town or @ an office of a  physician who is not in  the My Chart network.  This improves continuity of care & places you in control of your medical record.  Share results with all non [facility] medical staff seen  

## 2013-04-07 ENCOUNTER — Encounter (HOSPITAL_COMMUNITY)
Admission: RE | Admit: 2013-04-07 | Discharge: 2013-04-07 | Disposition: A | Discharge status: Home / Self Care | Payer: Medicare Other | Source: Ambulatory Visit | Attending: Rheumatology | Admitting: Rheumatology

## 2013-04-07 DIAGNOSIS — M069 Rheumatoid arthritis, unspecified: Secondary | ICD-10-CM | POA: Insufficient documentation

## 2013-04-07 MED ORDER — SODIUM CHLORIDE 0.9 % IV SOLN
750.0000 mg | INTRAVENOUS | Status: DC
  Administered 2013-04-07: 750 mg via INTRAVENOUS
  Filled 2013-04-07: qty 30

## 2013-04-07 MED ORDER — SODIUM CHLORIDE 0.9 % IV SOLN
INTRAVENOUS | Status: DC
  Administered 2013-04-07: 250 mL via INTRAVENOUS

## 2013-05-05 ENCOUNTER — Encounter (HOSPITAL_COMMUNITY)
Admission: RE | Admit: 2013-05-05 | Discharge: 2013-05-05 | Disposition: A | Discharge status: Home / Self Care | Payer: Medicare Other | Source: Ambulatory Visit | Attending: Rheumatology | Admitting: Rheumatology

## 2013-05-05 DIAGNOSIS — M069 Rheumatoid arthritis, unspecified: Secondary | ICD-10-CM | POA: Insufficient documentation

## 2013-05-05 MED ORDER — SODIUM CHLORIDE 0.9 % IV SOLN
INTRAVENOUS | Status: DC
  Administered 2013-05-05: 12:00:00 via INTRAVENOUS

## 2013-05-05 MED ORDER — SODIUM CHLORIDE 0.9 % IV SOLN
750.0000 mg | INTRAVENOUS | Status: DC
  Administered 2013-05-05: 750 mg via INTRAVENOUS
  Filled 2013-05-05: qty 30

## 2013-05-05 MED ORDER — SODIUM CHLORIDE 0.9 % IV SOLN: INTRAVENOUS | Status: DC

## 2013-05-05 MED ORDER — SODIUM CHLORIDE 0.9 % IV SOLN: 750.0000 mg | INTRAVENOUS | Status: DC

## 2013-06-01 ENCOUNTER — Telehealth: Payer: Self-pay | Admitting: Obstetrics and Gynecology

## 2013-06-01 NOTE — Telephone Encounter (Signed)
No refill needed at this time but will refill when pharm calls in Nov when current RX runs out.

## 2013-06-01 NOTE — Telephone Encounter (Signed)
Patient stated that she will be completely out of her estradiol at the beginning of November, and she does not want to have to go through a new doctor for a prescription refill after Dr. Tresa Res leaves. She stated that she would rather go ahead and take care of it now. [facility]

## 2013-06-01 NOTE — Telephone Encounter (Signed)
Last AEX 10-01-12 with Dr Tresa Res.  Scheduled for 10-12-13 with Dr Edward Jolly.  Worried about refill that will be needed before AEX and that no one would refill if she had not seen them.  Advised that with current AEX and chart on file and appt scheduled, we would not have trouble refilling her medication in Nov when she runs out.    Patient sad about Dr Tresa Res leaving.

## 2013-06-01 NOTE — Telephone Encounter (Signed)
Fine to refill her estradiol through her next AnEx.

## 2013-06-02 ENCOUNTER — Encounter (HOSPITAL_COMMUNITY)
Admission: RE | Admit: 2013-06-02 | Discharge: 2013-06-02 | Disposition: A | Discharge status: Home / Self Care | Payer: Medicare Other | Source: Ambulatory Visit | Attending: Rheumatology | Admitting: Rheumatology

## 2013-06-02 DIAGNOSIS — M069 Rheumatoid arthritis, unspecified: Secondary | ICD-10-CM | POA: Insufficient documentation

## 2013-06-02 MED ORDER — SODIUM CHLORIDE 0.9 % IV SOLN
INTRAVENOUS | Status: DC
  Administered 2013-06-02: 250 mL via INTRAVENOUS

## 2013-06-02 MED ORDER — SODIUM CHLORIDE 0.9 % IV SOLN
750.0000 mg | INTRAVENOUS | Status: DC
  Administered 2013-06-02: 750 mg via INTRAVENOUS
  Filled 2013-06-02: qty 30

## 2013-06-30 ENCOUNTER — Encounter (HOSPITAL_COMMUNITY)
Admission: RE | Admit: 2013-06-30 | Discharge: 2013-06-30 | Disposition: A | Discharge status: Home / Self Care | Payer: Medicare Other | Source: Ambulatory Visit | Attending: Rheumatology | Admitting: Rheumatology

## 2013-06-30 DIAGNOSIS — M069 Rheumatoid arthritis, unspecified: Secondary | ICD-10-CM | POA: Insufficient documentation

## 2013-06-30 MED ORDER — SODIUM CHLORIDE 0.9 % IV SOLN
INTRAVENOUS | Status: DC
  Administered 2013-06-30: 11:00:00 via INTRAVENOUS

## 2013-06-30 MED ORDER — SODIUM CHLORIDE 0.9 % IV SOLN
750.0000 mg | INTRAVENOUS | Status: DC
  Administered 2013-06-30: 750 mg via INTRAVENOUS
  Filled 2013-06-30: qty 30

## 2013-07-28 ENCOUNTER — Encounter (HOSPITAL_COMMUNITY)
Admission: RE | Admit: 2013-07-28 | Discharge: 2013-07-28 | Disposition: A | Discharge status: Home / Self Care | Payer: Medicare Other | Source: Ambulatory Visit | Attending: Rheumatology | Admitting: Rheumatology

## 2013-07-28 DIAGNOSIS — M069 Rheumatoid arthritis, unspecified: Secondary | ICD-10-CM | POA: Insufficient documentation

## 2013-07-28 MED ORDER — SODIUM CHLORIDE 0.9 % IV SOLN: INTRAVENOUS | Status: DC

## 2013-07-28 MED ORDER — SODIUM CHLORIDE 0.9 % IV SOLN
750.0000 mg | INTRAVENOUS | Status: DC
  Administered 2013-07-28: 750 mg via INTRAVENOUS
  Filled 2013-07-28: qty 30

## 2013-08-07 ENCOUNTER — Ambulatory Visit (INDEPENDENT_AMBULATORY_CARE_PROVIDER_SITE_OTHER): Payer: Medicare Other | Admitting: Radiology

## 2013-08-07 DIAGNOSIS — Z23 Encounter for immunization: Secondary | ICD-10-CM

## 2013-08-25 ENCOUNTER — Encounter (HOSPITAL_COMMUNITY)
Admission: RE | Admit: 2013-08-25 | Discharge: 2013-08-25 | Disposition: A | Discharge status: Home / Self Care | Payer: Medicare Other | Source: Ambulatory Visit | Attending: Rheumatology | Admitting: Rheumatology

## 2013-08-25 DIAGNOSIS — M069 Rheumatoid arthritis, unspecified: Secondary | ICD-10-CM | POA: Insufficient documentation

## 2013-08-25 MED ORDER — SODIUM CHLORIDE 0.9 % IV SOLN
INTRAVENOUS | Status: DC
  Administered 2013-08-25: 12:00:00 via INTRAVENOUS

## 2013-08-25 MED ORDER — SODIUM CHLORIDE 0.9 % IV SOLN
750.0000 mg | INTRAVENOUS | Status: DC
  Administered 2013-08-25: 750 mg via INTRAVENOUS
  Filled 2013-08-25: qty 30

## 2013-08-28 ENCOUNTER — Encounter: Payer: Self-pay | Admitting: Obstetrics and Gynecology

## 2013-09-08 ENCOUNTER — Other Ambulatory Visit: Payer: Self-pay | Admitting: Obstetrics and Gynecology

## 2013-09-08 NOTE — Telephone Encounter (Signed)
Annual exam scheduled for 10/14/13 with Dr. Edward Jolly

## 2013-09-22 ENCOUNTER — Encounter (HOSPITAL_COMMUNITY)
Admission: RE | Admit: 2013-09-22 | Discharge: 2013-09-22 | Disposition: A | Discharge status: Home / Self Care | Payer: Medicare Other | Source: Ambulatory Visit | Attending: Rheumatology | Admitting: Rheumatology

## 2013-09-22 DIAGNOSIS — M069 Rheumatoid arthritis, unspecified: Secondary | ICD-10-CM | POA: Insufficient documentation

## 2013-09-22 MED ORDER — SODIUM CHLORIDE 0.9 % IV SOLN
INTRAVENOUS | Status: DC
  Administered 2013-09-22: 11:00:00 via INTRAVENOUS

## 2013-09-22 MED ORDER — SODIUM CHLORIDE 0.9 % IV SOLN
750.0000 mg | INTRAVENOUS | Status: DC
  Administered 2013-09-22: 750 mg via INTRAVENOUS
  Filled 2013-09-22: qty 30

## 2013-10-12 ENCOUNTER — Ambulatory Visit: Payer: Medicare Other | Admitting: Obstetrics and Gynecology

## 2013-10-14 ENCOUNTER — Encounter: Payer: Self-pay | Admitting: Obstetrics and Gynecology

## 2013-10-14 ENCOUNTER — Ambulatory Visit (INDEPENDENT_AMBULATORY_CARE_PROVIDER_SITE_OTHER): Payer: Medicare Other | Admitting: Obstetrics and Gynecology

## 2013-10-14 VITALS — BP 136/70 | HR 100 | Ht 60.0 in | Wt 138.0 lb

## 2013-10-14 DIAGNOSIS — Z01419 Encounter for gynecological examination (general) (routine) without abnormal findings: Secondary | ICD-10-CM

## 2013-10-14 MED ORDER — ESTRADIOL 0.05 MG/24HR TD PTWK: 0.0500 mg | MEDICATED_PATCH | TRANSDERMAL | Status: DC

## 2013-10-14 NOTE — Progress Notes (Signed)
Patient ID: Gloria Warren, female   DOB: 1931-10-08, 79 y.o.   MRN: [id number] GYNECOLOGY VISIT  PCP:   Marga Melnick, MD  Referring provider:   HPI: 79 y.o.   Married  Caucasian  female   G2P2001 with No LMP recorded. Patient has had a hysterectomy.  Endometriosis.  Still has ovaries.  here for   AEX. Takes estradiol patch.  Wants to continue.  Takes prednisone daily.   Occasional urinary urgency and leakage.  No leak with cough or laugh. Wears a pad for confidence.  Does not do Kegels.    Hgb:    PCP Urine:  PCP  GYNECOLOGIC HISTORY: No LMP recorded. Patient has had a hysterectomy. Sexually active:  yes  Partner preference: female Contraception:  hysterectomy  Menopausal hormone therapy:  Estradiol 0.05mg  DES exposure:   no Blood transfusions:   no Sexually transmitted diseases:   no GYN Procedures:  TAH age 57 Mammogram:   10/2012 wnl: [facility]              Pap:  Years ago  History of abnormal pap smear:  no   OB History   Grav Para Term Preterm Abortions TAB SAB Ect Mult Living   2 2 2       1      Obstetric Comments   Daughter passed away in Mar 27, 2005 to AVM.       LIFESTYLE: Exercise:     No, limited because of rheumatoid arthritis          Tobacco:     no Alcohol:       rarely Drug use:   no  OTHER HEALTH MAINTENANCE: Tetanus/TDap:   Due now (needs to check with Rheumatologist to see if ok to take) Gardisil:              n/a Influenza:            08/2013 Zostavax:             With PCP  Bone density:   2013 osteopenia.  Colonoscopy:   10+ years ago:[facility]l  Cholesterol check: 02/2013 wnl: PCP  Family History  Problem Relation Age of Onset  . Breast cancer Mother 46  . Hypertension Mother   . Gout Mother   . Diabetes Maternal Aunt   . Hypertension Father   . Heart attack Father 54  . Hypertension Son   . Stroke Maternal Grandmother     late 4s    Patient Active Problem List   Diagnosis Date Noted  . Psoriasis 03/11/2013  . SKIN CANCER,  HX OF 06/26/2010  . ALLERGIC RHINITIS 06/23/2009  . FASTING HYPERGLYCEMIA 06/23/2009  . Rheumatoid arthritis(714.0) 02/25/2008  . HYPERLIPIDEMIA 11/17/2007  . HYPERTENSION 11/17/2007   Past Medical History  Diagnosis Date  . Hypertension   . Hyperlipidemia   . Rheumatoid arthritis(714.0)     Dr. Corliss Skains  . Allergic rhinitis   . History of skin cancer     squamous & basal cell   . Endometriosis   . Psoriasis     Past Surgical History  Procedure Laterality Date  . Appendectomy      age 66  . Total abdominal hysterectomy      age 62 for endometriosis (no BSO)  . Tonsillectomy    . Colonoscopy      Neg 2; [place] , [place]  . G 2  p 2      ALLERGIES: Arava and Sulfonamide derivatives  Current Outpatient Prescriptions  Medication Sig Dispense Refill  . abatacept (ORENCIA) 250 MG injection Inject into the vein. Every 4 weeks       . atorvastatin (LIPITOR) 10 MG tablet 1 qd  90 tablet  3  . benazepril (LOTENSIN) 10 MG tablet take 1/2 tablet by mouth once daily (BLOOD PRESSURE GOAL=AVERAGE LESS THAN 135/80)  90 tablet  3  . Calcium Carbonate (CALTRATE 600 PO) Take 600 mg by mouth 2 (two) times daily.        . chlorhexidine (PERIDEX) 0.12 % solution 1 mL by Mouth Rinse route daily.      . Cholecalciferol (VITAMIN D3) 1000 UNITS CAPS Take by mouth 2 (two) times daily.        Marland Kitchen estradiol (CLIMARA - DOSED IN MG/24 HR) 0.05 mg/24hr patch APPLY 1 PATCH EVERY WEEK  12 patch  0  . Fexofenadine HCl (ALLEGRA PO) Take by mouth daily.        . hydroxychloroquine (PLAQUENIL) 200 MG tablet Take 200 mg by mouth 2 (two) times daily.        . predniSONE (DELTASONE) 10 MG tablet Take 10 mg by mouth. Weaning off, currently on 5 mg       . traMADol (ULTRAM) 50 MG tablet Take 1 tablet by mouth at bedtime.       No current facility-administered medications for this visit.     ROS:  Pertinent items are noted in HPI.  SOCIAL HISTORY:  Married.  Retired Engineer, civil (consulting).   PHYSICAL EXAMINATION:    BP  136/70  Pulse 100  Ht 5' (1.524 m)  Wt 138 lb (62.596 kg)  BMI 26.95 kg/m2   Wt Readings from Last 3 Encounters:  10/14/13 138 lb (62.596 kg)  09/22/13 140 lb (63.504 kg)  08/25/13 140 lb (63.504 kg)     Ht Readings from Last 3 Encounters:  10/14/13 5' (1.524 m)  09/22/13 5' 0.5" (1.537 m)  08/25/13 5' 0.5" (1.537 m)    General appearance: alert, cooperative and appears stated age Head: Normocephalic, without obvious abnormality, atraumatic Neck: no adenopathy, supple, symmetrical, trachea midline and thyroid not enlarged, symmetric, no tenderness/mass/nodules Lungs: clear to auscultation bilaterally Breasts: Inspection negative, No nipple retraction or dimpling, No nipple discharge or bleeding, No axillary or supraclavicular adenopathy, Normal to palpation without dominant masses Heart: regular rate and rhythm Abdomen: soft, non-tender; no masses,  no organomegaly Extremities: extremities normal, atraumatic, no cyanosis or edema Skin: Skin color, texture, turgor normal. Annular red patches on right shoulder and back.  Lymph nodes: Cervical, supraclavicular, and axillary nodes normal. No abnormal inguinal nodes palpated Neurologic: Grossly normal  Pelvic: External genitalia:  no lesions              Urethra:  normal appearing urethra with no masses, tenderness or lesions              Bartholins and Skenes: normal                 Vagina: normal appearing vagina with normal color and discharge, no lesions              Cervix:  absent              Pap and high risk HPV testing done: no.            Bimanual Exam:  Uterus: absent.  Adnexa:  Ovary palpable at cuff, nontender.                                       Rectovaginal: Confirms                                      Anus:  normal sphincter tone, no lesions  ASSESSMENT  Normal gynecologic exam. Status post TAH.  ERT patient.  Desires to continue.  Chronic steroid use.  Osteopenia.    PLAN  Mammogram due.  Patient will schedule after new year.  Pap smear and high risk HPV testing not indicated.  Counseled on ERT - benefits and risks discussed - breast cancer DVT, PE, MI, stroke.  Estradiol patch 0.05 mg.  Medications per Epic orders. Ca, Vit D, and weight bearing exercise. Next bone density in one year.  Return annually or prn   An After Visit Summary was printed and given to the patient.

## 2013-10-14 NOTE — Patient Instructions (Signed)

## 2013-10-20 ENCOUNTER — Encounter (HOSPITAL_COMMUNITY)
Admission: RE | Admit: 2013-10-20 | Discharge: 2013-10-20 | Disposition: A | Discharge status: Home / Self Care | Payer: Medicare Other | Source: Ambulatory Visit | Attending: Rheumatology | Admitting: Rheumatology

## 2013-10-20 DIAGNOSIS — M069 Rheumatoid arthritis, unspecified: Secondary | ICD-10-CM | POA: Insufficient documentation

## 2013-10-20 MED ORDER — SODIUM CHLORIDE 0.9 % IV SOLN
750.0000 mg | INTRAVENOUS | Status: DC
  Administered 2013-10-20: 750 mg via INTRAVENOUS
  Filled 2013-10-20: qty 30

## 2013-10-20 MED ORDER — SODIUM CHLORIDE 0.9 % IV SOLN
INTRAVENOUS | Status: DC
  Administered 2013-10-20: 11:00:00 via INTRAVENOUS

## 2013-10-21 ENCOUNTER — Other Ambulatory Visit: Payer: Self-pay | Admitting: Obstetrics and Gynecology

## 2013-10-21 DIAGNOSIS — Z1231 Encounter for screening mammogram for malignant neoplasm of breast: Secondary | ICD-10-CM

## 2013-10-30 ENCOUNTER — Ambulatory Visit (INDEPENDENT_AMBULATORY_CARE_PROVIDER_SITE_OTHER): Payer: Medicare Other | Admitting: Internal Medicine

## 2013-10-30 VITALS — BP 134/80 | HR 84 | Temp 97.7°F | Resp 18 | Ht 60.5 in | Wt 138.0 lb

## 2013-10-30 DIAGNOSIS — N390 Urinary tract infection, site not specified: Secondary | ICD-10-CM

## 2013-10-30 DIAGNOSIS — R300 Dysuria: Secondary | ICD-10-CM

## 2013-10-30 LAB — POCT UA - MICROSCOPIC ONLY
Crystals, Ur, HPF, POC: NEGATIVE
Yeast, UA: POSITIVE

## 2013-10-30 LAB — POCT URINALYSIS DIPSTICK
Protein, UA: NEGATIVE
Spec Grav, UA: 1.01
Urobilinogen, UA: 0.2

## 2013-10-30 MED ORDER — CIPROFLOXACIN HCL 250 MG PO TABS: 250.0000 mg | ORAL_TABLET | Freq: Two times a day (BID) | ORAL | Status: DC

## 2013-10-30 NOTE — Progress Notes (Addendum)
Subjective:    Patient ID: Gloria Warren, female    DOB: [birth year], 79 y.o.   MRN: [id number] This chart was scribed for Ellamae Sia, MD by Clydene Laming, ED Scribe. This patient was seen in room 11  and the patient's care was started at 10:22 AM. HPI HPI Comments: Gloria Warren is a 79 y.o. female who presents to the [facility] complaining of urinary frequency with associated dysuria onset a few days ago. Pt states when the symptoms began they were not very significant. They became significant last night with associated mild tenderness in the abdomen. Pt denies back pain. Pt has a hx of severe rheumatoid arthritis and is on Plaquenil.   Patient Active Problem List   Diagnosis Date Noted  . Psoriasis 03/11/2013  . SKIN CANCER, HX OF 06/26/2010  . ALLERGIC RHINITIS 06/23/2009  . FASTING HYPERGLYCEMIA 06/23/2009  . Rheumatoid arthritis(714.0) 02/25/2008  . HYPERLIPIDEMIA 11/17/2007  . HYPERTENSION 11/17/2007   Past Medical History  Diagnosis Date  . Hypertension   . Hyperlipidemia   . Rheumatoid arthritis(714.0)     Dr. Corliss Skains  . Allergic rhinitis   . History of skin cancer     squamous & basal cell   . Endometriosis   . Psoriasis    Past Surgical History  Procedure Laterality Date  . Appendectomy      age 39  . Total abdominal hysterectomy      age 59 for endometriosis (no BSO)  . Tonsillectomy    . Colonoscopy      Neg 2; [place] , [place]  . G 2  p 2     Allergies  Allergen Reactions  . Arava [Leflunomide]     Liver & kidney dysfunction  . Sulfonamide Derivatives     Itching & rash   Prior to Admission medications   Medication Sig Start Date End Date Taking? Authorizing Provider  abatacept (ORENCIA) 250 MG injection Inject into the vein. Every 4 weeks    Yes Historical Provider, MD  atorvastatin (LIPITOR) 10 MG tablet 1 qd 03/11/13  Yes Pecola Lawless, MD  benazepril (LOTENSIN) 10 MG tablet take 1/2 tablet by mouth once daily  (BLOOD PRESSURE GOAL=AVERAGE LESS THAN 135/80) 03/11/13  Yes Pecola Lawless, MD  Calcium Carbonate (CALTRATE 600 PO) Take 600 mg by mouth 2 (two) times daily.     Yes Historical Provider, MD  chlorhexidine (PERIDEX) 0.12 % solution 1 mL by Mouth Rinse route daily. 08/18/13  Yes Historical Provider, MD  Cholecalciferol (VITAMIN D3) 1000 UNITS CAPS Take by mouth 2 (two) times daily.     Yes Historical Provider, MD  estradiol (CLIMARA - DOSED IN MG/24 HR) 0.05 mg/24hr patch Place 1 patch (0.05 mg total) onto the skin once a week. 10/14/13  Yes Brook E Amundson de Gwenevere Ghazi, MD  Fexofenadine HCl (ALLEGRA PO) Take by mouth daily.     Yes Historical Provider, MD  hydroxychloroquine (PLAQUENIL) 200 MG tablet Take 200 mg by mouth 2 (two) times daily.     Yes Historical Provider, MD  predniSONE (DELTASONE) 10 MG tablet Take 10 mg by mouth. Weaning off, currently on 5 mg    Yes Historical Provider, MD  traMADol (ULTRAM) 50 MG tablet Take 1 tablet by mouth at bedtime. 08/28/13  Yes Historical Provider, MD       Review of Systems  Constitutional: Negative for fever, chills and diaphoresis.  Gastrointestinal: Negative for nausea, vomiting and diarrhea.  Genitourinary:  Negative for flank pain.  Musculoskeletal: Negative for back pain.       Objective:   Physical Exam  Nursing note and vitals reviewed. Constitutional: She is oriented to person, place, and time. She appears well-developed and well-nourished.  HENT:  Head: Normocephalic.  Cardiovascular: Normal rate.   Pulmonary/Chest: Effort normal.  Abdominal: There is no tenderness.  Neurological: She is alert and oriented to person, place, and time.  Psychiatric: She has a normal mood and affect. Thought content normal.    Filed Vitals:   10/30/13 0930  BP: 134/80  Pulse: 84  Temp: 97.7 F (36.5 C)  TempSrc: Oral  Resp: 18  Height: 5' 0.5" (1.537 m)  Weight: 138 lb (62.596 kg)  SpO2: 95%    Results for orders placed in visit  on 10/30/13  POCT URINALYSIS DIPSTICK      Result Value Range   Color, UA yellow     Clarity, UA clear     Glucose, UA neg     Bilirubin, UA neg     Ketones, UA neg     Spec Grav, UA 1.010     Blood, UA small     pH, UA 7.0     Protein, UA neg     Urobilinogen, UA 0.2     Nitrite, UA neg     Leukocytes, UA moderate (2+)    POCT UA - MICROSCOPIC ONLY      Result Value Range   WBC, Ur, HPF, POC tntc     RBC, urine, microscopic 3-11     Bacteria, U Microscopic 1+     Mucus, UA pos     Epithelial cells, urine per micros 4-6     Crystals, Ur, HPF, POC neg     Casts, Ur, LPF, POC neg     Yeast, UA pos           Assessment & Plan:  Dysuria - Plan: POCT urinalysis dipstick, POCT UA - Microscopic Only  Urinary tract infection, site not specified - Plan: Urine culture  Meds ordered this encounter  Medications  . ciprofloxacin (CIPRO) 250 MG tablet    Sig: Take 1 tablet (250 mg total) by mouth 2 (two) times daily.    Dispense:  10 tablet    Refill:  0   I personally performed the services described in this documentation, which was scribed in my presence. The recorded information has been reviewed and is accurate.

## 2013-10-31 LAB — URINE CULTURE: Colony Count: 25000

## 2013-11-10 ENCOUNTER — Other Ambulatory Visit: Payer: Self-pay | Admitting: Obstetrics and Gynecology

## 2013-11-17 ENCOUNTER — Ambulatory Visit (HOSPITAL_COMMUNITY)
Admission: RE | Admit: 2013-11-17 | Discharge: 2013-11-17 | Disposition: A | Discharge status: Home / Self Care | Payer: Medicare Other | Source: Ambulatory Visit | Attending: Rheumatology | Admitting: Rheumatology

## 2013-11-17 DIAGNOSIS — M069 Rheumatoid arthritis, unspecified: Secondary | ICD-10-CM | POA: Diagnosis not present

## 2013-11-17 MED ORDER — SODIUM CHLORIDE 0.9 % IV SOLN
INTRAVENOUS | Status: DC
  Administered 2013-11-17: 11:00:00 via INTRAVENOUS

## 2013-11-17 MED ORDER — SODIUM CHLORIDE 0.9 % IV SOLN
750.0000 mg | INTRAVENOUS | Status: DC
  Administered 2013-11-17: 750 mg via INTRAVENOUS
  Filled 2013-11-17: qty 30

## 2013-11-24 ENCOUNTER — Other Ambulatory Visit: Payer: Self-pay

## 2013-11-24 DIAGNOSIS — Z1231 Encounter for screening mammogram for malignant neoplasm of breast: Secondary | ICD-10-CM

## 2013-12-01 ENCOUNTER — Other Ambulatory Visit: Payer: Self-pay | Admitting: Obstetrics and Gynecology

## 2013-12-10 DIAGNOSIS — H2510 Age-related nuclear cataract, unspecified eye: Secondary | ICD-10-CM | POA: Diagnosis not present

## 2013-12-10 DIAGNOSIS — Z79899 Other long term (current) drug therapy: Secondary | ICD-10-CM | POA: Diagnosis not present

## 2013-12-14 ENCOUNTER — Ambulatory Visit: Payer: Medicare Other

## 2013-12-15 ENCOUNTER — Encounter (HOSPITAL_COMMUNITY)
Admission: RE | Admit: 2013-12-15 | Discharge: 2013-12-15 | Disposition: A | Discharge status: Home / Self Care | Payer: Medicare Other | Source: Ambulatory Visit | Attending: Rheumatology | Admitting: Rheumatology

## 2013-12-15 DIAGNOSIS — M069 Rheumatoid arthritis, unspecified: Secondary | ICD-10-CM | POA: Insufficient documentation

## 2013-12-15 MED ORDER — SODIUM CHLORIDE 0.9 % IV SOLN
INTRAVENOUS | Status: DC
  Administered 2013-12-15: 250 mL via INTRAVENOUS

## 2013-12-15 MED ORDER — SODIUM CHLORIDE 0.9 % IV SOLN
750.0000 mg | INTRAVENOUS | Status: DC
  Administered 2013-12-15: 750 mg via INTRAVENOUS
  Filled 2013-12-15: qty 30

## 2013-12-16 DIAGNOSIS — M069 Rheumatoid arthritis, unspecified: Secondary | ICD-10-CM | POA: Diagnosis not present

## 2013-12-16 DIAGNOSIS — M25549 Pain in joints of unspecified hand: Secondary | ICD-10-CM | POA: Diagnosis not present

## 2013-12-16 DIAGNOSIS — Z09 Encounter for follow-up examination after completed treatment for conditions other than malignant neoplasm: Secondary | ICD-10-CM | POA: Diagnosis not present

## 2013-12-16 DIAGNOSIS — M25569 Pain in unspecified knee: Secondary | ICD-10-CM | POA: Diagnosis not present

## 2013-12-17 ENCOUNTER — Ambulatory Visit
Admission: RE | Admit: 2013-12-17 | Discharge: 2013-12-17 | Disposition: A | Discharge status: Home / Self Care | Payer: Medicare Other | Source: Ambulatory Visit

## 2013-12-17 DIAGNOSIS — Z1231 Encounter for screening mammogram for malignant neoplasm of breast: Secondary | ICD-10-CM | POA: Diagnosis not present

## 2013-12-17 DIAGNOSIS — Z79899 Other long term (current) drug therapy: Secondary | ICD-10-CM | POA: Diagnosis not present

## 2013-12-22 ENCOUNTER — Other Ambulatory Visit: Payer: Self-pay | Admitting: Obstetrics and Gynecology

## 2013-12-22 DIAGNOSIS — R928 Other abnormal and inconclusive findings on diagnostic imaging of breast: Secondary | ICD-10-CM

## 2013-12-28 DIAGNOSIS — L570 Actinic keratosis: Secondary | ICD-10-CM | POA: Diagnosis not present

## 2013-12-28 DIAGNOSIS — L719 Rosacea, unspecified: Secondary | ICD-10-CM | POA: Diagnosis not present

## 2013-12-31 ENCOUNTER — Ambulatory Visit
Admission: RE | Admit: 2013-12-31 | Discharge: 2013-12-31 | Disposition: A | Discharge status: Home / Self Care | Payer: Medicare Other | Source: Ambulatory Visit | Attending: Obstetrics and Gynecology | Admitting: Obstetrics and Gynecology

## 2013-12-31 DIAGNOSIS — R928 Other abnormal and inconclusive findings on diagnostic imaging of breast: Secondary | ICD-10-CM | POA: Diagnosis not present

## 2014-01-06 DIAGNOSIS — M1710 Unilateral primary osteoarthritis, unspecified knee: Secondary | ICD-10-CM | POA: Diagnosis not present

## 2014-01-12 ENCOUNTER — Encounter (HOSPITAL_COMMUNITY)
Admission: RE | Admit: 2014-01-12 | Discharge: 2014-01-12 | Disposition: A | Discharge status: Home / Self Care | Payer: Medicare Other | Source: Ambulatory Visit | Attending: Rheumatology | Admitting: Rheumatology

## 2014-01-12 DIAGNOSIS — M069 Rheumatoid arthritis, unspecified: Secondary | ICD-10-CM | POA: Diagnosis not present

## 2014-01-12 MED ORDER — SODIUM CHLORIDE 0.9 % IV SOLN
750.0000 mg | INTRAVENOUS | Status: DC
  Administered 2014-01-12: 750 mg via INTRAVENOUS
  Filled 2014-01-12: qty 30

## 2014-01-12 MED ORDER — SODIUM CHLORIDE 0.9 % IV SOLN
INTRAVENOUS | Status: DC
Start: 2014-01-12 — End: 2014-01-13

## 2014-01-13 DIAGNOSIS — M1710 Unilateral primary osteoarthritis, unspecified knee: Secondary | ICD-10-CM | POA: Diagnosis not present

## 2014-01-22 DIAGNOSIS — M1710 Unilateral primary osteoarthritis, unspecified knee: Secondary | ICD-10-CM | POA: Diagnosis not present

## 2014-02-09 ENCOUNTER — Encounter (HOSPITAL_COMMUNITY)
Admission: RE | Admit: 2014-02-09 | Discharge: 2014-02-09 | Disposition: A | Discharge status: Home / Self Care | Payer: Medicare Other | Source: Ambulatory Visit | Attending: Rheumatology | Admitting: Rheumatology

## 2014-02-09 MED ORDER — SODIUM CHLORIDE 0.9 % IV SOLN
INTRAVENOUS | Status: DC
  Administered 2014-02-09: 11:00:00 via INTRAVENOUS

## 2014-02-09 MED ORDER — SODIUM CHLORIDE 0.9 % IV SOLN
750.0000 mg | INTRAVENOUS | Status: DC
  Administered 2014-02-09: 750 mg via INTRAVENOUS
  Filled 2014-02-09: qty 30

## 2014-02-23 DIAGNOSIS — M25569 Pain in unspecified knee: Secondary | ICD-10-CM | POA: Diagnosis not present

## 2014-02-23 DIAGNOSIS — Z09 Encounter for follow-up examination after completed treatment for conditions other than malignant neoplasm: Secondary | ICD-10-CM | POA: Diagnosis not present

## 2014-02-23 DIAGNOSIS — M069 Rheumatoid arthritis, unspecified: Secondary | ICD-10-CM | POA: Diagnosis not present

## 2014-03-09 ENCOUNTER — Encounter (HOSPITAL_COMMUNITY)
Admission: RE | Admit: 2014-03-09 | Discharge: 2014-03-09 | Disposition: A | Discharge status: Home / Self Care | Payer: Medicare Other | Source: Ambulatory Visit | Attending: Rheumatology | Admitting: Rheumatology

## 2014-03-09 DIAGNOSIS — M069 Rheumatoid arthritis, unspecified: Secondary | ICD-10-CM | POA: Insufficient documentation

## 2014-03-09 MED ORDER — SODIUM CHLORIDE 0.9 % IV SOLN
INTRAVENOUS | Status: DC
  Administered 2014-03-09: 11:00:00 via INTRAVENOUS

## 2014-03-09 MED ORDER — SODIUM CHLORIDE 0.9 % IV SOLN
750.0000 mg | INTRAVENOUS | Status: DC
  Administered 2014-03-09: 750 mg via INTRAVENOUS
  Filled 2014-03-09: qty 30

## 2014-03-12 DIAGNOSIS — N390 Urinary tract infection, site not specified: Secondary | ICD-10-CM

## 2014-03-12 HISTORY — DX: Urinary tract infection, site not specified: N39.0

## 2014-03-17 ENCOUNTER — Encounter: Payer: Medicare Other | Admitting: Internal Medicine

## 2014-03-22 DIAGNOSIS — Z79899 Other long term (current) drug therapy: Secondary | ICD-10-CM | POA: Diagnosis not present

## 2014-03-24 ENCOUNTER — Telehealth: Payer: Self-pay | Admitting: Internal Medicine

## 2014-03-24 ENCOUNTER — Ambulatory Visit (INDEPENDENT_AMBULATORY_CARE_PROVIDER_SITE_OTHER): Payer: Medicare Other | Admitting: Internal Medicine

## 2014-03-24 ENCOUNTER — Telehealth: Payer: Self-pay

## 2014-03-24 ENCOUNTER — Other Ambulatory Visit (INDEPENDENT_AMBULATORY_CARE_PROVIDER_SITE_OTHER): Payer: Medicare Other

## 2014-03-24 ENCOUNTER — Encounter: Payer: Self-pay | Admitting: Internal Medicine

## 2014-03-24 ENCOUNTER — Other Ambulatory Visit: Payer: Self-pay | Admitting: Internal Medicine

## 2014-03-24 ENCOUNTER — Other Ambulatory Visit: Payer: Medicare Other

## 2014-03-24 VITALS — BP 130/70 | HR 88 | Temp 97.9°F | Resp 14 | Ht 60.5 in | Wt 134.8 lb

## 2014-03-24 DIAGNOSIS — E785 Hyperlipidemia, unspecified: Secondary | ICD-10-CM | POA: Diagnosis not present

## 2014-03-24 DIAGNOSIS — R300 Dysuria: Secondary | ICD-10-CM

## 2014-03-24 DIAGNOSIS — R7309 Other abnormal glucose: Secondary | ICD-10-CM

## 2014-03-24 DIAGNOSIS — R319 Hematuria, unspecified: Secondary | ICD-10-CM

## 2014-03-24 DIAGNOSIS — Z23 Encounter for immunization: Secondary | ICD-10-CM | POA: Diagnosis not present

## 2014-03-24 DIAGNOSIS — I10 Essential (primary) hypertension: Secondary | ICD-10-CM

## 2014-03-24 DIAGNOSIS — M069 Rheumatoid arthritis, unspecified: Secondary | ICD-10-CM | POA: Diagnosis not present

## 2014-03-24 LAB — POCT URINALYSIS DIPSTICK
Bilirubin, UA: NEGATIVE
GLUCOSE UA: NEGATIVE
Ketones, UA: NEGATIVE
NITRITE UA: NEGATIVE
Spec Grav, UA: 1.025
UROBILINOGEN UA: 0.2
pH, UA: 6

## 2014-03-24 LAB — TSH: TSH: 3.45 u[IU]/mL (ref 0.35–4.50)

## 2014-03-24 LAB — HEMOGLOBIN A1C: Hgb A1c MFr Bld: 5.6 % (ref 4.6–6.5)

## 2014-03-24 MED ORDER — BENAZEPRIL HCL 20 MG PO TABS: ORAL_TABLET | ORAL | Status: DC

## 2014-03-24 MED ORDER — BENAZEPRIL HCL 10 MG PO TABS: ORAL_TABLET | ORAL | Status: DC

## 2014-03-24 MED ORDER — FUROSEMIDE 20 MG PO TABS: ORAL_TABLET | ORAL | Status: DC

## 2014-03-24 NOTE — Assessment & Plan Note (Addendum)
NMR Lipoprofile ,TSH  LFTs checked by Rheumatologist

## 2014-03-24 NOTE — Patient Instructions (Signed)
Your next office appointment will be determined based upon review of your pending labs . Those instructions will be transmitted to you through My Chart  OR  by mail;whichever process is your choice to receive results & recommendations .  Share results with all non [facility] medical staff seen  

## 2014-03-24 NOTE — Assessment & Plan Note (Signed)
Verify results

## 2014-03-24 NOTE — Assessment & Plan Note (Signed)
A1c

## 2014-03-24 NOTE — Telephone Encounter (Signed)
Mailed original

## 2014-03-24 NOTE — Telephone Encounter (Signed)
[facility] called [facility] stating that they received a RX for benazepril 10 mg 1/2 tab but her fill history shows 20 mg 1/2 tab. They would like to confirm if this is an intentional dose decrease? Please advise [place]

## 2014-03-24 NOTE — Telephone Encounter (Signed)
Thank you ; please change dosage

## 2014-03-24 NOTE — Assessment & Plan Note (Signed)
Blood pressure goals reviewed. BMET checked by Rheumatology

## 2014-03-24 NOTE — Telephone Encounter (Signed)
Rx corrected and resent to pharmacy. 

## 2014-03-24 NOTE — Progress Notes (Signed)
Pre visit review using our clinic review tool, if applicable. No additional management support is needed unless otherwise documented below in the visit note. 

## 2014-03-24 NOTE — Progress Notes (Signed)
   Subjective:    Patient ID: Gloria Warren, female    DOB: 02-04-1931, 80 y.o.   MRN: [id number]  HPI  She is here to assess active health issues & conditions. PMH, FH, & Social history verified & updated . Ophth exam UTD. A heart healthy diet is followed; exercise  Limited by RA.  No falling.  Family history is + for premature coronary disease. Advanced cholesterol testing not done to date. There is medication compliance with the statin.  Low dose ASA not taken due to easy bleeding   Review of Systems Specifically denied are  chest pain, palpitations, dyspnea, or claudication.  Some pedal edema several weeks. Significant abdominal symptoms, memory deficit, or myalgias not present.  Diffuse arthralgias. Dysuria X 1 week without hematuria or pyuria. No associated fever, chills, or sweats.      Objective:   Physical Exam    Gen.: Healthy and well-nourished in appearance. Alert, appropriate and cooperative throughout exam. Appears younger than stated age  Head: Normocephalic without obvious abnormalities Eyes: Bilateral ptosis.No corneal or conjunctival inflammation noted. Pupils equal round reactive to light and accommodation. Extraocular motion intact.  Ears: External  ear exam reveals no significant lesions or deformities. Canals clear .TMs normal. Hearing is grossly decreased bilaterally. Nose: External nasal exam reveals no deformity or inflammation. Nasal mucosa are pink and moist. No lesions or exudates noted.   Mouth: Oral mucosa and oropharynx reveal no lesions or exudates. Teeth in good repair. Neck: No deformities, masses, or tenderness noted. Range of motion surprisingly good. Thyroid normal. Lungs: Normal respiratory effort; chest expands symmetrically. Lungs are clear to auscultation without rales, wheezes, or increased work of breathing. Heart: Normal rate and rhythm. Normal S1 and S2. No gallop, click, or rub. No murmur. Abdomen: Bowel sounds normal; abdomen soft  and nontender. No masses, organomegaly or hernias noted.                             Musculoskeletal/extremities:  Accentuated curvature of upper thoracic spine.  Rheumatoid hand & feet deformities. 1/2-1+ edema extremity noted. Fingernail / toenail health good. Able to lie down & sit up w/o help. Negative SLR bilaterally Vascular: Carotid, radial artery, dorsalis pedis and  posterior tibial pulses are full and equal. No bruits present. Neurologic: Alert and oriented x3. Deep tendon reflexes symmetrical but 1/2+ @ knees  Gait deliberate & broad    Skin: Scattered psoriatic lesions Lymph: No cervical, axillary lymphadenopathy present. Psych: Mood and affect are normal. Normally interactive                                                                                        Assessment & Plan:  See Current Assessment & Plan in Problem List under specific DiagnosisThe labs will be reviewed and risks and options assessed. Written recommendations will be provided by mail or directly through My Chart.Further evaluation or change in medical therapy will be directed by those results.

## 2014-03-24 NOTE — Telephone Encounter (Signed)
Patient's husband is calling on her behalf in regards to a record of the pt's Mammogram that they brought in to her OV this morning. He states that we went to make a copy of it for our records but never gave them back the original. Pt asks for the original to be mailed to her address on file. Please call when mailed.

## 2014-03-25 DIAGNOSIS — R300 Dysuria: Secondary | ICD-10-CM | POA: Insufficient documentation

## 2014-03-25 LAB — NMR LIPOPROFILE WITH LIPIDS
CHOLESTEROL, TOTAL: 140 mg/dL (ref <200)
HDL PARTICLE NUMBER: 43.5 umol/L (ref >=30.5)
HDL Size: 9.8 nm (ref >=9.2)
HDL-C: 68 mg/dL (ref >=40)
LARGE HDL: 12.1 umol/L (ref >=4.8)
LDL (calc): 49 mg/dL (ref <100)
LDL Particle Number: 739 nmol/L (ref <1000)
LDL Size: 19.9 nm — ABNORMAL LOW (ref >20.5)
LP-IR Score: <25 (ref <=45)
Small LDL Particle Number: 435 nmol/L (ref <=527)
TRIGLYCERIDES: 113 mg/dL (ref <150)
VLDL Size: 41.5 nm (ref <=46.6)

## 2014-03-25 NOTE — Assessment & Plan Note (Signed)
C/S

## 2014-03-26 ENCOUNTER — Telehealth: Payer: Self-pay | Admitting: *Deleted

## 2014-03-26 ENCOUNTER — Other Ambulatory Visit: Payer: Self-pay | Admitting: Internal Medicine

## 2014-03-26 DIAGNOSIS — N390 Urinary tract infection, site not specified: Secondary | ICD-10-CM

## 2014-03-26 MED ORDER — NITROFURANTOIN MONOHYD MACRO 100 MG PO CAPS: 100.0000 mg | ORAL_CAPSULE | Freq: Two times a day (BID) | ORAL | Status: DC

## 2014-03-26 NOTE — Telephone Encounter (Signed)
Pts husband called requesting Rx for UTI symptoms.  He further states pts symptoms are worsening, they are wanting an Rx with or without culture results.  Please advise

## 2014-03-26 NOTE — Telephone Encounter (Signed)
Rx sent , final C&S not back yet

## 2014-03-26 NOTE — Telephone Encounter (Signed)
Spoke with pts husband advised Rx sent

## 2014-03-27 ENCOUNTER — Encounter: Payer: Self-pay | Admitting: Internal Medicine

## 2014-03-27 ENCOUNTER — Other Ambulatory Visit: Payer: Self-pay | Admitting: Internal Medicine

## 2014-03-27 DIAGNOSIS — N390 Urinary tract infection, site not specified: Secondary | ICD-10-CM

## 2014-03-27 LAB — URINE CULTURE

## 2014-03-27 MED ORDER — CIPROFLOXACIN HCL 500 MG PO TABS: 500.0000 mg | ORAL_TABLET | Freq: Two times a day (BID) | ORAL | Status: DC

## 2014-03-28 ENCOUNTER — Other Ambulatory Visit: Payer: Self-pay | Admitting: Internal Medicine

## 2014-03-28 ENCOUNTER — Encounter: Payer: Self-pay | Admitting: Internal Medicine

## 2014-04-01 ENCOUNTER — Telehealth: Payer: Self-pay | Admitting: Internal Medicine

## 2014-04-01 NOTE — Telephone Encounter (Signed)
   Take the Cipro 12 hours apart. Take the Lasix as 1/2 pill  6 hours after the first Cipro dose

## 2014-04-01 NOTE — Telephone Encounter (Signed)
Patient has been advised

## 2014-04-01 NOTE — Telephone Encounter (Signed)
Patient Information:  Caller Name: Kryssa  Phone: [phone]  Patient: Gloria Warren, Gloria Warren  Gender: Female  DOB: [birth year]  Age: 80 Years  PCP: Unice Cobble  Office Follow Up:  Does the office need to follow up with this patient?: Yes  Instructions For The Office: Patient has questions about medication.  Please contact  RN Note:  Patient wants to know can she take Ciprofloxin and Lasix together?  She states the Rx on ciprofloxin states not to take with Diuretic. She has held off on Diuretic until advised by Dr. Nira Retort.  please review and follow up.  Symptoms  Reason For Call & Symptoms: Patient states she was seen in the office on 03/24/14 for her physical.  She is was placed on Lasix for edema/swelling.  She reports that she was also having UTI symptoms and they office called in Ciprofloxin.  When looking at the directions on Ciprofloxin it says "not to take with a diuretic".  She has held off on the lasix until she spoke with Dr. Linna Darner.  Can she take both?  Will be on Cipro until Monday.  She states the swelling is above her ankles, puffy and hard.  It is worse since being seen in office and it did not totally go away last night after sleeping. No chest pain or shortness of breath.  Reviewed Health History In EMR: Yes  Reviewed Medications In EMR: Yes  Reviewed Allergies In EMR: Yes  Reviewed Surgeries / Procedures: Yes  Date of Onset of Symptoms: 03/24/2014  Guideline(s) Used:  Leg Swelling and Edema  Disposition Per Guideline:   See Within 2 Weeks in Office  Reason For Disposition Reached:   Mild swelling of both ankles and chronic (unchanged)  Advice Given:  Call Back If:  Swelling becomes worse  Swelling becomes red or painful to the touch  Calf pain occurs and becomes constant  You become worse.  RN Overrode Recommendation:  Document Patient  Patient has medication question regarding Cipro and diuretic

## 2014-04-02 ENCOUNTER — Other Ambulatory Visit (HOSPITAL_COMMUNITY): Payer: Self-pay | Admitting: *Deleted

## 2014-04-06 ENCOUNTER — Inpatient Hospital Stay (HOSPITAL_COMMUNITY): Admission: RE | Admit: 2014-04-06 | Payer: Medicare Other | Source: Ambulatory Visit

## 2014-04-14 ENCOUNTER — Telehealth: Payer: Self-pay | Admitting: Obstetrics and Gynecology

## 2014-04-14 DIAGNOSIS — M1710 Unilateral primary osteoarthritis, unspecified knee: Secondary | ICD-10-CM | POA: Diagnosis not present

## 2014-04-14 NOTE — Telephone Encounter (Signed)
Patient Husband is calling about a bill for patient

## 2014-04-15 ENCOUNTER — Encounter (HOSPITAL_COMMUNITY)
Admission: RE | Admit: 2014-04-15 | Discharge: 2014-04-15 | Disposition: A | Discharge status: Home / Self Care | Payer: Medicare Other | Source: Ambulatory Visit | Attending: Rheumatology | Admitting: Rheumatology

## 2014-04-15 DIAGNOSIS — M069 Rheumatoid arthritis, unspecified: Secondary | ICD-10-CM | POA: Insufficient documentation

## 2014-04-15 MED ORDER — SODIUM CHLORIDE 0.9 % IV SOLN
INTRAVENOUS | Status: DC
  Administered 2014-04-15: 11:00:00 via INTRAVENOUS

## 2014-04-15 MED ORDER — SODIUM CHLORIDE 0.9 % IV SOLN
750.0000 mg | INTRAVENOUS | Status: DC
  Administered 2014-04-15: 750 mg via INTRAVENOUS
  Filled 2014-04-15: qty 30

## 2014-04-22 DIAGNOSIS — M1710 Unilateral primary osteoarthritis, unspecified knee: Secondary | ICD-10-CM | POA: Diagnosis not present

## 2014-04-29 DIAGNOSIS — M1710 Unilateral primary osteoarthritis, unspecified knee: Secondary | ICD-10-CM | POA: Diagnosis not present

## 2014-05-10 DIAGNOSIS — Z0389 Encounter for observation for other suspected diseases and conditions ruled out: Secondary | ICD-10-CM | POA: Diagnosis not present

## 2014-05-12 ENCOUNTER — Encounter (HOSPITAL_COMMUNITY)
Admission: RE | Admit: 2014-05-12 | Discharge: 2014-05-12 | Disposition: A | Discharge status: Home / Self Care | Payer: Medicare Other | Source: Ambulatory Visit | Attending: Rheumatology | Admitting: Rheumatology

## 2014-05-12 DIAGNOSIS — M069 Rheumatoid arthritis, unspecified: Secondary | ICD-10-CM | POA: Diagnosis not present

## 2014-05-12 MED ORDER — SODIUM CHLORIDE 0.9 % IV SOLN
750.0000 mg | INTRAVENOUS | Status: DC
  Administered 2014-05-12: 750 mg via INTRAVENOUS
  Filled 2014-05-12: qty 30

## 2014-05-12 MED ORDER — SODIUM CHLORIDE 0.9 % IV SOLN
INTRAVENOUS | Status: DC
  Administered 2014-05-12: 10:00:00 via INTRAVENOUS

## 2014-06-02 DIAGNOSIS — H2510 Age-related nuclear cataract, unspecified eye: Secondary | ICD-10-CM | POA: Diagnosis not present

## 2014-06-08 ENCOUNTER — Encounter (HOSPITAL_COMMUNITY)
Admission: RE | Admit: 2014-06-08 | Discharge: 2014-06-08 | Disposition: A | Discharge status: Home / Self Care | Payer: Medicare Other | Source: Ambulatory Visit | Attending: Rheumatology | Admitting: Rheumatology

## 2014-06-08 DIAGNOSIS — M069 Rheumatoid arthritis, unspecified: Secondary | ICD-10-CM | POA: Diagnosis not present

## 2014-06-08 MED ORDER — SODIUM CHLORIDE 0.9 % IV SOLN
INTRAVENOUS | Status: DC
  Administered 2014-06-08: 10:00:00 via INTRAVENOUS

## 2014-06-08 MED ORDER — SODIUM CHLORIDE 0.9 % IV SOLN
750.0000 mg | INTRAVENOUS | Status: DC
  Administered 2014-06-08: 750 mg via INTRAVENOUS
  Filled 2014-06-08: qty 30

## 2014-06-14 DIAGNOSIS — Z79899 Other long term (current) drug therapy: Secondary | ICD-10-CM | POA: Diagnosis not present

## 2014-06-16 ENCOUNTER — Other Ambulatory Visit: Payer: Self-pay | Admitting: Internal Medicine

## 2014-06-16 ENCOUNTER — Telehealth: Payer: Self-pay | Admitting: Internal Medicine

## 2014-06-16 DIAGNOSIS — I10 Essential (primary) hypertension: Secondary | ICD-10-CM

## 2014-06-16 MED ORDER — BENAZEPRIL HCL 10 MG PO TABS: 10.0000 mg | ORAL_TABLET | Freq: Every day | ORAL | Status: DC

## 2014-06-16 NOTE — Telephone Encounter (Signed)
Dr Linna Darner,  Is patient to be on 10 mg daily? Okay for change? Please advise.

## 2014-06-16 NOTE — Telephone Encounter (Signed)
Error

## 2014-06-16 NOTE — Telephone Encounter (Signed)
Need a refill script for benazepril 10 mg not 20mg  (Dr. Linna Darner was suppose to change patient states) sent to [facility] on [address] in [place].

## 2014-07-06 ENCOUNTER — Encounter (HOSPITAL_COMMUNITY)
Admission: RE | Admit: 2014-07-06 | Discharge: 2014-07-06 | Disposition: A | Discharge status: Home / Self Care | Payer: Medicare Other | Source: Ambulatory Visit | Attending: Rheumatology | Admitting: Rheumatology

## 2014-07-06 DIAGNOSIS — M069 Rheumatoid arthritis, unspecified: Secondary | ICD-10-CM | POA: Diagnosis not present

## 2014-07-06 MED ORDER — SODIUM CHLORIDE 0.9 % IV SOLN
INTRAVENOUS | Status: DC
  Administered 2014-07-06: 11:00:00 via INTRAVENOUS

## 2014-07-06 MED ORDER — SODIUM CHLORIDE 0.9 % IV SOLN
750.0000 mg | INTRAVENOUS | Status: AC
  Administered 2014-07-06: 750 mg via INTRAVENOUS
  Filled 2014-07-06: qty 30

## 2014-07-19 ENCOUNTER — Emergency Department (HOSPITAL_COMMUNITY)
Admission: EM | Admit: 2014-07-19 | Discharge: 2014-07-19 | Disposition: A | Discharge status: Home / Self Care | Payer: Medicare Other | Attending: Emergency Medicine | Admitting: Emergency Medicine

## 2014-07-19 ENCOUNTER — Emergency Department (HOSPITAL_COMMUNITY): Payer: Medicare Other

## 2014-07-19 ENCOUNTER — Encounter (HOSPITAL_COMMUNITY): Payer: Self-pay | Admitting: Emergency Medicine

## 2014-07-19 DIAGNOSIS — Z862 Personal history of diseases of the blood and blood-forming organs and certain disorders involving the immune mechanism: Secondary | ICD-10-CM | POA: Insufficient documentation

## 2014-07-19 DIAGNOSIS — S199XXA Unspecified injury of neck, initial encounter: Secondary | ICD-10-CM | POA: Diagnosis not present

## 2014-07-19 DIAGNOSIS — N809 Endometriosis, unspecified: Secondary | ICD-10-CM | POA: Insufficient documentation

## 2014-07-19 DIAGNOSIS — Z79899 Other long term (current) drug therapy: Secondary | ICD-10-CM | POA: Insufficient documentation

## 2014-07-19 DIAGNOSIS — Y9389 Activity, other specified: Secondary | ICD-10-CM | POA: Diagnosis not present

## 2014-07-19 DIAGNOSIS — Z8709 Personal history of other diseases of the respiratory system: Secondary | ICD-10-CM | POA: Insufficient documentation

## 2014-07-19 DIAGNOSIS — S058X9A Other injuries of unspecified eye and orbit, initial encounter: Secondary | ICD-10-CM | POA: Diagnosis not present

## 2014-07-19 DIAGNOSIS — M069 Rheumatoid arthritis, unspecified: Secondary | ICD-10-CM | POA: Insufficient documentation

## 2014-07-19 DIAGNOSIS — I10 Essential (primary) hypertension: Secondary | ICD-10-CM | POA: Diagnosis not present

## 2014-07-19 DIAGNOSIS — S0990XA Unspecified injury of head, initial encounter: Secondary | ICD-10-CM | POA: Diagnosis not present

## 2014-07-19 DIAGNOSIS — S0230XA Fracture of orbital floor, unspecified side, initial encounter for closed fracture: Secondary | ICD-10-CM | POA: Insufficient documentation

## 2014-07-19 DIAGNOSIS — W010XXA Fall on same level from slipping, tripping and stumbling without subsequent striking against object, initial encounter: Secondary | ICD-10-CM | POA: Insufficient documentation

## 2014-07-19 DIAGNOSIS — Z85828 Personal history of other malignant neoplasm of skin: Secondary | ICD-10-CM | POA: Diagnosis not present

## 2014-07-19 DIAGNOSIS — S0120XA Unspecified open wound of nose, initial encounter: Secondary | ICD-10-CM | POA: Insufficient documentation

## 2014-07-19 DIAGNOSIS — S023XXA Fracture of orbital floor, initial encounter for closed fracture: Secondary | ICD-10-CM

## 2014-07-19 DIAGNOSIS — Y9229 Other specified public building as the place of occurrence of the external cause: Secondary | ICD-10-CM | POA: Insufficient documentation

## 2014-07-19 DIAGNOSIS — Z872 Personal history of diseases of the skin and subcutaneous tissue: Secondary | ICD-10-CM | POA: Insufficient documentation

## 2014-07-19 DIAGNOSIS — Z87440 Personal history of urinary (tract) infections: Secondary | ICD-10-CM | POA: Diagnosis not present

## 2014-07-19 DIAGNOSIS — W1809XA Striking against other object with subsequent fall, initial encounter: Secondary | ICD-10-CM | POA: Diagnosis not present

## 2014-07-19 DIAGNOSIS — W19XXXA Unspecified fall, initial encounter: Secondary | ICD-10-CM

## 2014-07-19 DIAGNOSIS — Z8639 Personal history of other endocrine, nutritional and metabolic disease: Secondary | ICD-10-CM | POA: Insufficient documentation

## 2014-07-19 DIAGNOSIS — IMO0002 Reserved for concepts with insufficient information to code with codable children: Secondary | ICD-10-CM

## 2014-07-19 DIAGNOSIS — S0993XA Unspecified injury of face, initial encounter: Secondary | ICD-10-CM | POA: Diagnosis not present

## 2014-07-19 MED ORDER — AMOXICILLIN-POT CLAVULANATE 875-125 MG PO TABS: 1.0000 | ORAL_TABLET | Freq: Two times a day (BID) | ORAL | Status: DC

## 2014-07-19 MED ORDER — BACITRACIN ZINC 500 UNIT/GM EX OINT
4.0000 "application " | TOPICAL_OINTMENT | Freq: Once | CUTANEOUS | Status: AC
  Administered 2014-07-19: 4 via TOPICAL
  Filled 2014-07-19: qty 3.6

## 2014-07-19 MED ORDER — LIDOCAINE-EPINEPHRINE 2 %-1:100000 IJ SOLN
20.0000 mL | Freq: Once | INTRAMUSCULAR | Status: AC
  Administered 2014-07-19: 1 mL via INTRADERMAL
  Filled 2014-07-19: qty 1

## 2014-07-19 MED ORDER — HYDROCODONE-ACETAMINOPHEN 5-325 MG PO TABS: ORAL_TABLET | ORAL | Status: DC

## 2014-07-19 MED ORDER — AMOXICILLIN-POT CLAVULANATE 875-125 MG PO TABS
1.0000 | ORAL_TABLET | Freq: Once | ORAL | Status: AC
  Administered 2014-07-19: 1 via ORAL
  Filled 2014-07-19: qty 1

## 2014-07-19 MED ORDER — ACETAMINOPHEN 325 MG PO TABS
650.0000 mg | ORAL_TABLET | Freq: Once | ORAL | Status: AC
  Administered 2014-07-19: 650 mg via ORAL
  Filled 2014-07-19: qty 2

## 2014-07-19 NOTE — ED Notes (Signed)
Bed: WA21 Expected date:  Expected time:  Means of arrival:  Comments: EMS- slip and fall, eye lacs, bruising to nose

## 2014-07-19 NOTE — ED Notes (Signed)
Pt to CT

## 2014-07-19 NOTE — ED Provider Notes (Signed)
CSN: [id number]     Arrival date & time 07/19/14  1421 History   First MD Initiated Contact with Patient 07/19/14 1505     Chief Complaint  Patient presents with  . Fall  . Facial Laceration     (Consider location/radiation/quality/duration/timing/severity/associated sxs/prior Treatment) HPI  Gloria Warren is a 80 y.o. female presenting after slipping fall she was at a restaurant just prior to arrival. Patient states that her feet got stuck on a carpet and she fell to the ground. She hit her head on the floor. States she tried to brace her fall putting both hands out with fall on outstretched hand. Patient reports a 3/10 mild global headache. Lacerations to the right perirbital region, patient was wearing glasses at the time. Patient denies any blood thinners, loss of consciousness, change in vision, dysarthria, ataxia, nausea, vomiting , chest pain, shortness of breath, difficulty moving major joints. Patient has been ambulatory since the event. States last tetanus shot was in the last year.  Past Medical History  Diagnosis Date  . Hypertension   . Hyperlipidemia   . Rheumatoid arthritis(714.0)     Dr. Estanislado Pandy  . Allergic rhinitis   . History of skin cancer     squamous & basal cell ;Dr Denna Haggard  . Endometriosis   . Psoriasis   . UTI (lower urinary tract infection) 5/15    Proteus , R to Nitrofurantoin & Penicillin   Past Surgical History  Procedure Laterality Date  . Appendectomy      age 80  . Total abdominal hysterectomy      age 47 for endometriosis (no BSO)  . Tonsillectomy    . Colonoscopy      Neg 2; [facility] , [place]  . G 2  p 2     Family History  Problem Relation Age of Onset  . Breast cancer Mother 65  . Hypertension Mother   . Gout Mother   . Diabetes Maternal Aunt   . Hypertension Father   . Heart attack Father 82  . Hypertension Son   . Stroke Maternal Grandmother     late 44s   History  Substance Use Topics  . Smoking status: Never Smoker   .  Smokeless tobacco: Not on file  . Alcohol Use: Yes     Comment:  rarely   OB History   Grav Para Term Preterm Abortions TAB SAB Ect Mult Living   2 2 2       1      Obstetric Comments   Daughter passed away in 03-11-05 to AVM.     Review of Systems  10 systems reviewed and found to be negative, except as noted in the HPI.   Allergies  Arava and Sulfonamide derivatives  Home Medications   Prior to Admission medications   Medication Sig Start Date End Date Taking? Authorizing Provider  abatacept (ORENCIA) 250 MG injection Inject into the vein. Every 4 weeks    Yes Historical Provider, MD  benazepril (LOTENSIN) 10 MG tablet Take 1 tablet (10 mg total) by mouth daily. 06/16/14  Yes Hendricks Limes, MD  Calcium Carbonate (CALTRATE 600 PO) Take 600 mg by mouth 2 (two) times daily.     Yes Historical Provider, MD  chlorhexidine (PERIDEX) 0.12 % solution 1 mL by Mouth Rinse route daily. 08/18/13  Yes Historical Provider, MD  estradiol (CLIMARA - DOSED IN MG/24 HR) 0.05 mg/24hr patch Place 1 patch (0.05 mg total) onto the skin once a week. 10/14/13  Yes Brook E Amundson de Berton Lan, MD  Fexofenadine HCl (ALLEGRA PO) Take 1 tablet by mouth at bedtime.    Yes Historical Provider, MD  furosemide (LASIX) 20 MG tablet Take 20 mg by mouth daily as needed (swelling).   Yes Historical Provider, MD  hydroxychloroquine (PLAQUENIL) 200 MG tablet Take 200 mg by mouth 2 (two) times daily.    Yes Historical Provider, MD  predniSONE (DELTASONE) 10 MG tablet Take 10 mg by mouth. Weaning off, currently on 5 mg    Yes Historical Provider, MD  traMADol (ULTRAM) 50 MG tablet Take 1 tablet by mouth at bedtime. 08/28/13  Yes Historical Provider, MD  amoxicillin-clavulanate (AUGMENTIN) 875-125 MG per tablet Take 1 tablet by mouth 2 (two) times daily. One po bid x 7 days 07/19/14   Elmyra Ricks Lautaro Koral, PA-C  HYDROcodone-acetaminophen (NORCO/VICODIN) 5-325 MG per tablet Take 1-2 tablets by mouth every 6 hours as needed  for pain. 07/19/14   Bradley Bostelman, PA-C   BP 134/63  Pulse 80  Temp(Src) 98.2 F (36.8 C) (Oral)  Resp 16  SpO2 98% Physical Exam  Nursing note and vitals reviewed. Constitutional: She is oriented to person, place, and time. She appears well-developed and well-nourished. No distress.  HENT:  Head: Normocephalic.  Right Ear: External ear normal.  Left Ear: External ear normal.  Mouth/Throat: Oropharynx is clear and moist.  Multiple lacerations on right periorbital rim: 1 approximately 4 cm in gaping to the upper lateral eyelid. One approximately 5 cm gaping to the lower eyelid there is a small laceration close to the nasal bridge.  Extraocular movement is intact without pain or diplopia.  No hemotympanum, battle signs or raccoon's eyes  No crepitance or tenderness to palpation along the orbital rim.  EOMI intact with no pain or diplopia  No abnormal otorrhea or rhinorrhea. Nasal septum midline.  No intraoral trauma.  Eyes: Conjunctivae and EOM are normal. Pupils are equal, round, and reactive to light.  Neck: Normal range of motion. Neck supple.  No midline C-spine  tenderness to palpation or step-offs appreciated. Patient has full range of motion without pain.   Cardiovascular: Normal rate, regular rhythm and intact distal pulses.   Pulmonary/Chest: Effort normal and breath sounds normal. No stridor. No respiratory distress. She has no wheezes. She has no rales. She exhibits no tenderness.  No  TTP or crepitance  Abdominal: Soft. Bowel sounds are normal. She exhibits no distension and no mass. There is no tenderness. There is no rebound and no guarding.  Musculoskeletal: Normal range of motion. She exhibits no edema and no tenderness.  No snuffbox tenderness palpation bilaterally, there is excellent range of motion with no deformity neurovascularly intact  Pelvis stable. No deformity or TTP of major joints.   Good ROM  Neurological: She is alert and oriented to person,  place, and time.  Strength 5/5 x4 extremities   Distal sensation intact  Skin: Skin is warm.  Psychiatric: She has a normal mood and affect.    ED Course  Procedures (including critical care time)   LACERATION REPAIR Performed by: Monico Blitz Consent: Verbal consent obtained. Risks and benefits: risks, benefits and alternatives were discussed Consent given by: patient Patient identity confirmed: Wrist band   Wound explored to depth in good light on a bloodless field, with no foreign bodies seen or palpated.  Prepped and draped in normal sterile fashion    Tetanus: UTD  Laceration Location: Right upper eyelid  Laceration Length: 4 cm  Anesthesia:  local  Local anesthetic: 2% lidocaine with epinephrine  Anesthetic total: 5 ml  Irrigation method: Low Pressure  Amount of cleaning: 1L sterile NS  Skin closure: 5-0 Ethilon  Number of sutures: 5  Technique: Simple interrupted  Patient tolerance: Patient tolerated the procedure well with no immediate complications.   Antibx ointment applied. Instructions for care discussed verbally and patient provided with additional written instructions for homecare and f/u.  LACERATION REPAIR Performed by: Monico Blitz Consent: Verbal consent obtained. Risks and benefits: risks, benefits and alternatives were discussed Consent given by: patient Patient identity confirmed: Wrist band   Wound explored to depth in good light on a bloodless field, with no foreign bodies seen or palpated.  Prepped and draped in normal sterile fashion    Tetanus: UTD  Laceration Location: Right lower eyelid  Laceration Length: 5 cm  Anesthesia: local  Local anesthetic: 2% lidocaine with epinephrine  Anesthetic total: 5 ml  Irrigation method: Low Pressure  Amount of cleaning: 1L sterile NS  Skin closure: 5-0 Ethilon  Number of sutures: 6  Technique: Simple interrupted  Patient tolerance: Patient tolerated the procedure well with no  immediate complications.   Antibx ointment applied. Instructions for care discussed verbally and patient provided with additional written instructions for homecare and f/u.  Labs Review Labs Reviewed - No data to display  Imaging Review Ct Head Wo Contrast  07/19/2014   CLINICAL DATA:  Injury  EXAM: CT HEAD WITHOUT CONTRAST  CT MAXILLOFACIAL WITHOUT CONTRAST  CT CERVICAL SPINE WITHOUT CONTRAST  TECHNIQUE: Multidetector CT imaging of the head, cervical spine, and maxillofacial structures were performed using the standard protocol without intravenous contrast. Multiplanar CT image reconstructions of the cervical spine and maxillofacial structures were also generated.  COMPARISON:  None.  FINDINGS: CT HEAD FINDINGS  No mass effect or hemorrhage. Mild global atrophy appropriate to age. New ventricular system is unremarkable. No skull fracture. Fluid in the right maxillary sinus.  CT MAXILLOFACIAL FINDINGS  There is a minimally displaced fracture involving the floor of the right orbit. The orbital rim is intact. Fracture is slightly comminuted. No evidence of ocular muscle entrapment. A small amount of orbital fat has herniated through the defect in the orbit floor. There is sub air blood level in the right maxillary sinus. No evidence of vitreous hemorrhage. Minimal soft tissue swelling over the right orbit. Medial right orbital wall is intact. Left orbit is intact. Mandible is intact.  CT CERVICAL SPINE FINDINGS  No fracture. No dislocation. No obvious soft tissue injury. No evidence of spinal hemorrhage. Thyroid is atrophic.  Advanced left facet arthropathy at C2-3.  Minimal anterolisthesis C4 upon C5 is degenerative an associated with right facet arthropathy and mild right foraminal narrowing.  Slight retrolisthesis and posterior osteophytic ridging at C5-6 results in spinal stenosis. Chronic and degenerative cord compression is not excluded.  Disc narrowing and posterior disc osteophytes at C6-7 are less  severe.  IMPRESSION: No acute intracranial or brain pathology.  Acute right orbit floor fracture.  No cervical spine injury.  Advanced degenerative changes noted.   Electronically Signed   By: Maryclare Bean M.D.   On: 07/19/2014 16:08   Ct Cervical Spine Wo Contrast  07/19/2014   CLINICAL DATA:  Injury  EXAM: CT HEAD WITHOUT CONTRAST  CT MAXILLOFACIAL WITHOUT CONTRAST  CT CERVICAL SPINE WITHOUT CONTRAST  TECHNIQUE: Multidetector CT imaging of the head, cervical spine, and maxillofacial structures were performed using the standard protocol without intravenous contrast. Multiplanar CT image reconstructions of  the cervical spine and maxillofacial structures were also generated.  COMPARISON:  None.  FINDINGS: CT HEAD FINDINGS  No mass effect or hemorrhage. Mild global atrophy appropriate to age. New ventricular system is unremarkable. No skull fracture. Fluid in the right maxillary sinus.  CT MAXILLOFACIAL FINDINGS  There is a minimally displaced fracture involving the floor of the right orbit. The orbital rim is intact. Fracture is slightly comminuted. No evidence of ocular muscle entrapment. A small amount of orbital fat has herniated through the defect in the orbit floor. There is sub air blood level in the right maxillary sinus. No evidence of vitreous hemorrhage. Minimal soft tissue swelling over the right orbit. Medial right orbital wall is intact. Left orbit is intact. Mandible is intact.  CT CERVICAL SPINE FINDINGS  No fracture. No dislocation. No obvious soft tissue injury. No evidence of spinal hemorrhage. Thyroid is atrophic.  Advanced left facet arthropathy at C2-3.  Minimal anterolisthesis C4 upon C5 is degenerative an associated with right facet arthropathy and mild right foraminal narrowing.  Slight retrolisthesis and posterior osteophytic ridging at C5-6 results in spinal stenosis. Chronic and degenerative cord compression is not excluded.  Disc narrowing and posterior disc osteophytes at C6-7 are less  severe.  IMPRESSION: No acute intracranial or brain pathology.  Acute right orbit floor fracture.  No cervical spine injury.  Advanced degenerative changes noted.   Electronically Signed   By: Maryclare Bean M.D.   On: 07/19/2014 16:08   Ct Maxillofacial Wo Cm  07/19/2014   CLINICAL DATA:  Injury  EXAM: CT HEAD WITHOUT CONTRAST  CT MAXILLOFACIAL WITHOUT CONTRAST  CT CERVICAL SPINE WITHOUT CONTRAST  TECHNIQUE: Multidetector CT imaging of the head, cervical spine, and maxillofacial structures were performed using the standard protocol without intravenous contrast. Multiplanar CT image reconstructions of the cervical spine and maxillofacial structures were also generated.  COMPARISON:  None.  FINDINGS: CT HEAD FINDINGS  No mass effect or hemorrhage. Mild global atrophy appropriate to age. New ventricular system is unremarkable. No skull fracture. Fluid in the right maxillary sinus.  CT MAXILLOFACIAL FINDINGS  There is a minimally displaced fracture involving the floor of the right orbit. The orbital rim is intact. Fracture is slightly comminuted. No evidence of ocular muscle entrapment. A small amount of orbital fat has herniated through the defect in the orbit floor. There is sub air blood level in the right maxillary sinus. No evidence of vitreous hemorrhage. Minimal soft tissue swelling over the right orbit. Medial right orbital wall is intact. Left orbit is intact. Mandible is intact.  CT CERVICAL SPINE FINDINGS  No fracture. No dislocation. No obvious soft tissue injury. No evidence of spinal hemorrhage. Thyroid is atrophic.  Advanced left facet arthropathy at C2-3.  Minimal anterolisthesis C4 upon C5 is degenerative an associated with right facet arthropathy and mild right foraminal narrowing.  Slight retrolisthesis and posterior osteophytic ridging at C5-6 results in spinal stenosis. Chronic and degenerative cord compression is not excluded.  Disc narrowing and posterior disc osteophytes at C6-7 are less severe.   IMPRESSION: No acute intracranial or brain pathology.  Acute right orbit floor fracture.  No cervical spine injury.  Advanced degenerative changes noted.   Electronically Signed   By: Maryclare Bean M.D.   On: 07/19/2014 16:08     EKG Interpretation None      MDM   Final diagnoses:  Orbital floor fracture, closed, initial encounter  Laceration  Fall, initial encounter    Filed Vitals:   07/19/14  1430 07/19/14 1725  BP: 136/59 134/63  Pulse: 79 80  Temp: 98.2 F (36.8 C)   TempSrc: Oral   Resp: 18 16  SpO2: 97% 98%    Medications  acetaminophen (TYLENOL) tablet 650 mg (650 mg Oral Given 07/19/14 1546)  lidocaine-EPINEPHrine (XYLOCAINE W/EPI) 2 %-1:100000 (with pres) injection 20 mL (1 mL Intradermal Given 07/19/14 1711)  bacitracin ointment 4 application (4 application Topical Given 07/19/14 1712)  amoxicillin-clavulanate (AUGMENTIN) 875-125 MG per tablet 1 tablet (1 tablet Oral Given 07/19/14 1711)    Gloria Warren is a 80 y.o. female presenting with slip and fall with lacerations and right peri-orbital ecchymoses. Head CT is negative, cervical CT is negative however maxillofacial shows a inferior orbital wall fracture. There are no signs of entrapment. Patient is started on Augmentin and advised not to blow her nose or Valsalva to hard. ENT referral given. Wounds are closed and patient is counseled on wound care.  Evaluation does not show pathology that would require ongoing emergent intervention or inpatient treatment. Pt is hemodynamically stable and mentating appropriately. Discussed findings and plan with patient/guardian, who agrees with care plan. All questions answered. Return precautions discussed and outpatient follow up given.   Discharge Medication List as of 07/19/2014  5:16 PM    START taking these medications   Details  amoxicillin-clavulanate (AUGMENTIN) 875-125 MG per tablet Take 1 tablet by mouth 2 (two) times daily. One po bid x 7 days, Starting 07/19/2014, Until  Discontinued, Print    HYDROcodone-acetaminophen (NORCO/VICODIN) 5-325 MG per tablet Take 1-2 tablets by mouth every 6 hours as needed for pain., [employer], PA-C 07/20/14 [id number]

## 2014-07-19 NOTE — ED Notes (Signed)
Dressings applied to abrasion/skin tears.

## 2014-07-19 NOTE — ED Notes (Signed)
PER EMS - pt tripped on carpet in a restaurant and fell to floor, hit face on floor, was wearing glasses and sustained approx 2" lac above R eye and skin tear below eye.  Bleeding controlled.  No LOC.  No blood thinners.  3/10 pain.  A+Ox4.

## 2014-07-19 NOTE — Discharge Instructions (Signed)
Take vicodin for breakthrough pain, do not drink alcohol, drive, care for children or do other critical tasks while taking vicodin.   Take your antibiotics as directed and to completion. You should never have any leftover antibiotics! Push fluids and stay well hydrated.   Continue to take your allegra and use a stool softener. DO NOT blow your nose and try to minimize valsalva.   Please be very careful not to fall! The pain medication puts you at risk for falls. Please rest as much as possible and try to not stay alone.   Keep wound dry and do not remove dressing for 24 hours if possible. After that, wash gently morning and night (every 12 hours) with soap and water. Use a topical antibiotic ointment and cover with a bandaid or gauze.    Do NOT use rubbing alcohol or hydrogen peroxide, do not soak the area   Present to your primary care doctor or the urgent care of your choice, or the ED for suture removal in 7 days.   Every attempt was made to remove foreign body (contaminants) from the wound.  However, there is always a chance that some may remain in the wound. This can  increase your risk of infection.   If you see signs of infection (warmth, redness, tenderness, pus, sharp increase in pain, fever, red streaking in the skin) immediately return to the emergency department.   After the wound heals fully, apply sunscreen for 6-12 months to minimize scarring.   Head Injury You have received a head injury. It does not appear serious at this time. Headaches and vomiting are common following head injury. It should be easy to awaken from sleeping. Sometimes it is necessary for you to stay in the emergency department for a while for observation. Sometimes admission to the hospital may be needed. After injuries such as yours, most problems occur within the first 24 hours, but side effects may occur up to 7-10 days after the injury. It is important for you to carefully monitor your condition and  contact your health care provider or seek immediate medical care if there is a change in your condition. WHAT ARE THE TYPES OF HEAD INJURIES? Head injuries can be as minor as a bump. Some head injuries can be more severe. More severe head injuries include:  A jarring injury to the brain (concussion).  A bruise of the brain (contusion). This mean there is bleeding in the brain that can cause swelling.  A cracked skull (skull fracture).  Bleeding in the brain that collects, clots, and forms a bump (hematoma). WHAT CAUSES A HEAD INJURY? A serious head injury is most likely to happen to someone who is in a car wreck and is not wearing a seat belt. Other causes of major head injuries include bicycle or motorcycle accidents, sports injuries, and falls. HOW ARE HEAD INJURIES DIAGNOSED? A complete history of the event leading to the injury and your current symptoms will be helpful in diagnosing head injuries. Many times, pictures of the brain, such as CT or MRI are needed to see the extent of the injury. Often, an overnight hospital stay is necessary for observation.  WHEN SHOULD I SEEK IMMEDIATE MEDICAL CARE?  You should get help right away if:  You have confusion or drowsiness.  You feel sick to your stomach (nauseous) or have continued, forceful vomiting.  You have dizziness or unsteadiness that is getting worse.  You have severe, continued headaches not relieved by medicine. Only take  over-the-counter or prescription medicines for pain, fever, or discomfort as directed by your health care provider.  You do not have normal function of the arms or legs or are unable to walk.  You notice changes in the black spots in the center of the colored part of your eye (pupil).  You have a clear or bloody fluid coming from your nose or ears.  You have a loss of vision. During the next 24 hours after the injury, you must stay with someone who can watch you for the warning signs. This person should  contact local emergency services (911 in the [place] if you have seizures, you become unconscious, or you are unable to wake up. HOW CAN I PREVENT A HEAD INJURY IN THE FUTURE? The most important factor for preventing major head injuries is avoiding motor vehicle accidents. To minimize the potential for damage to your head, it is crucial to wear seat belts while riding in motor vehicles. Wearing helmets while bike riding and playing collision sports (like football) is also helpful. Also, avoiding dangerous activities around the house will further help reduce your risk of head injury.  WHEN CAN I RETURN TO NORMAL ACTIVITIES AND ATHLETICS? You should be reevaluated by your health care provider before returning to these activities. If you have any of the following symptoms, you should not return to activities or contact sports until 1 week after the symptoms have stopped:  Persistent headache.  Dizziness or vertigo.  Poor attention and concentration.  Confusion.  Memory problems.  Nausea or vomiting.  Fatigue or tire easily.  Irritability.  Intolerant of bright lights or loud noises.  Anxiety or depression.  Disturbed sleep. MAKE SURE YOU:   Understand these instructions.  Will watch your condition.  Will get help right away if you are not doing well or get worse. Document Released: 10/29/2005 Document Revised: 11/03/2013 Document Reviewed: 07/06/2013 [facility] Patient Information 2015 [place], [place]. This information is not intended to replace advice given to you by your health care provider. Make sure you discuss any questions you have with your health care provider.

## 2014-07-19 NOTE — Progress Notes (Signed)
Pt went to restroom to get urine specimen. Pt stated when she bent forward blood began to run out of her nose Cm noted blood stain of right nare. Pt provided tissues. Pt walked to restroom with 2 staff members but did well with hand held assistance.  No c/o dizziness, no loss of balance Encouraged to walk with head up, looking up

## 2014-07-19 NOTE — ED Notes (Signed)
Initial Contact - pt A+Ox4, family at bedside, pt reports tripped on carpet and fell forward, hitting face on the ground.  Pt denies LOC. Pt denies dizziness, weakness or other complaints now or prior to event.  Approx 1.5" lacs to R eyebrow and just below R eye.  Inner eye is swollen and bruised with blood oozing.  Otherwise bleeding controlled. Otherwise skin PWD.  Speaking full/clear sentences, rr even/un-lab.  NAD.

## 2014-07-20 NOTE — ED Provider Notes (Signed)
Medical screening examination/treatment/procedure(s) were performed by non-physician practitioner and as supervising physician I was immediately available for consultation/collaboration.   EKG Interpretation None       Gloria Warren. Alvino Chapel, MD 07/20/14 [phone]

## 2014-07-21 ENCOUNTER — Encounter: Payer: Self-pay | Admitting: Obstetrics and Gynecology

## 2014-07-23 DIAGNOSIS — S0230XA Fracture of orbital floor, unspecified side, initial encounter for closed fracture: Secondary | ICD-10-CM | POA: Diagnosis not present

## 2014-07-26 ENCOUNTER — Other Ambulatory Visit: Payer: Self-pay | Admitting: Obstetrics and Gynecology

## 2014-07-26 ENCOUNTER — Telehealth: Payer: Self-pay | Admitting: Obstetrics and Gynecology

## 2014-07-26 MED ORDER — ESTRADIOL 0.05 MG/24HR TD PTWK: 0.0500 mg | MEDICATED_PATCH | TRANSDERMAL | Status: DC

## 2014-07-26 NOTE — Telephone Encounter (Signed)
Patient calling for refills on Estradiol. She says she only has enough for the next few weeks and is not due for an AEX until 10/20/14 when she is scheduled.  [facility] - [place], [place] - [address]

## 2014-07-26 NOTE — Telephone Encounter (Signed)
I sent her prescription to the pharmacy for 3 months worth of Estradiol patch. Keep appointment for annual exam for December.

## 2014-07-26 NOTE — Telephone Encounter (Signed)
Last AEX: 10/14/13 Last refill:10/08/12 Current AEX:12 9/15 Last MMG: 12/31/13  Bi-Rads neg  Routing to provider. Paper chart is in your box  Please advise

## 2014-08-02 DIAGNOSIS — D692 Other nonthrombocytopenic purpura: Secondary | ICD-10-CM | POA: Diagnosis not present

## 2014-08-02 DIAGNOSIS — T148XXA Other injury of unspecified body region, initial encounter: Secondary | ICD-10-CM | POA: Diagnosis not present

## 2014-08-03 ENCOUNTER — Encounter (HOSPITAL_COMMUNITY): Payer: Medicare Other

## 2014-08-04 ENCOUNTER — Telehealth: Payer: Self-pay | Admitting: Obstetrics and Gynecology

## 2014-08-04 NOTE — Telephone Encounter (Signed)
Spoke with patient. She placed a new patch on yesterday and now cannot find it at all. I advised patient to place a new place today and can remove in one week as usual. Patient worried about running of patches too quickly. She is planing to wear patch for one extra day, but will be out of patches early, advised not recommended, may have symptoms if wears patch for 8 days. Patient has annual scheduled with Regina Eck CNM for 10/20/14 and will have enough patches until next annual. Advised can discuss with Regina Eck CNM  at that time as there is no way to purchase just one patch. Patient agreeable.  Routing to provider for final review. Patient agreeable to disposition. Will close encounter

## 2014-08-04 NOTE — Telephone Encounter (Signed)
Patient's estradiol patch has fallen off, patient is not sure what to do. Please call.

## 2014-08-10 ENCOUNTER — Encounter (HOSPITAL_COMMUNITY)
Admission: RE | Admit: 2014-08-10 | Discharge: 2014-08-10 | Disposition: A | Discharge status: Home / Self Care | Payer: Medicare Other | Source: Ambulatory Visit | Attending: Rheumatology | Admitting: Rheumatology

## 2014-08-10 DIAGNOSIS — M069 Rheumatoid arthritis, unspecified: Secondary | ICD-10-CM | POA: Insufficient documentation

## 2014-08-10 MED ORDER — SODIUM CHLORIDE 0.9 % IV SOLN
750.0000 mg | INTRAVENOUS | Status: DC
  Administered 2014-08-10: 750 mg via INTRAVENOUS
  Filled 2014-08-10: qty 30

## 2014-08-10 MED ORDER — SODIUM CHLORIDE 0.9 % IV SOLN
INTRAVENOUS | Status: DC
  Administered 2014-08-10: 10:00:00 via INTRAVENOUS

## 2014-08-16 ENCOUNTER — Ambulatory Visit (INDEPENDENT_AMBULATORY_CARE_PROVIDER_SITE_OTHER): Payer: Medicare Other

## 2014-08-16 DIAGNOSIS — Z79899 Other long term (current) drug therapy: Secondary | ICD-10-CM | POA: Diagnosis not present

## 2014-08-16 DIAGNOSIS — Z23 Encounter for immunization: Secondary | ICD-10-CM | POA: Diagnosis not present

## 2014-09-06 ENCOUNTER — Other Ambulatory Visit (HOSPITAL_COMMUNITY): Payer: Self-pay | Admitting: *Deleted

## 2014-09-07 ENCOUNTER — Encounter (HOSPITAL_COMMUNITY)
Admission: RE | Admit: 2014-09-07 | Discharge: 2014-09-07 | Disposition: A | Discharge status: Home / Self Care | Payer: Medicare Other | Source: Ambulatory Visit | Attending: Rheumatology | Admitting: Rheumatology

## 2014-09-07 DIAGNOSIS — Z5181 Encounter for therapeutic drug level monitoring: Secondary | ICD-10-CM | POA: Diagnosis not present

## 2014-09-07 DIAGNOSIS — M069 Rheumatoid arthritis, unspecified: Secondary | ICD-10-CM | POA: Insufficient documentation

## 2014-09-07 MED ORDER — SODIUM CHLORIDE 0.9 % IV SOLN
INTRAVENOUS | Status: DC
  Administered 2014-09-07: 11:00:00 via INTRAVENOUS

## 2014-09-07 MED ORDER — SODIUM CHLORIDE 0.9 % IV SOLN
750.0000 mg | INTRAVENOUS | Status: DC
  Administered 2014-09-07: 750 mg via INTRAVENOUS
  Filled 2014-09-07: qty 30

## 2014-09-13 ENCOUNTER — Encounter (HOSPITAL_COMMUNITY): Payer: Self-pay | Admitting: Emergency Medicine

## 2014-09-16 DIAGNOSIS — M79641 Pain in right hand: Secondary | ICD-10-CM | POA: Diagnosis not present

## 2014-09-16 DIAGNOSIS — M25562 Pain in left knee: Secondary | ICD-10-CM | POA: Diagnosis not present

## 2014-09-16 DIAGNOSIS — M0609 Rheumatoid arthritis without rheumatoid factor, multiple sites: Secondary | ICD-10-CM | POA: Diagnosis not present

## 2014-09-16 DIAGNOSIS — M1712 Unilateral primary osteoarthritis, left knee: Secondary | ICD-10-CM | POA: Diagnosis not present

## 2014-10-04 ENCOUNTER — Other Ambulatory Visit (HOSPITAL_COMMUNITY): Payer: Self-pay | Admitting: *Deleted

## 2014-10-05 ENCOUNTER — Encounter (HOSPITAL_COMMUNITY)
Admission: RE | Admit: 2014-10-05 | Discharge: 2014-10-05 | Disposition: A | Discharge status: Home / Self Care | Payer: Medicare Other | Source: Ambulatory Visit | Attending: Rheumatology | Admitting: Rheumatology

## 2014-10-05 DIAGNOSIS — M069 Rheumatoid arthritis, unspecified: Secondary | ICD-10-CM | POA: Diagnosis not present

## 2014-10-05 DIAGNOSIS — Z5181 Encounter for therapeutic drug level monitoring: Secondary | ICD-10-CM | POA: Insufficient documentation

## 2014-10-05 MED ORDER — SODIUM CHLORIDE 0.9 % IV SOLN
750.0000 mg | INTRAVENOUS | Status: DC
  Administered 2014-10-05: 750 mg via INTRAVENOUS
  Filled 2014-10-05: qty 30

## 2014-10-05 MED ORDER — SODIUM CHLORIDE 0.9 % IV SOLN
INTRAVENOUS | Status: DC
  Administered 2014-10-05: 10:00:00 via INTRAVENOUS

## 2014-10-18 ENCOUNTER — Ambulatory Visit: Payer: Medicare Other | Admitting: Obstetrics and Gynecology

## 2014-10-20 ENCOUNTER — Encounter: Payer: Self-pay | Admitting: Obstetrics and Gynecology

## 2014-10-20 ENCOUNTER — Ambulatory Visit (INDEPENDENT_AMBULATORY_CARE_PROVIDER_SITE_OTHER): Payer: Medicare Other | Admitting: Obstetrics and Gynecology

## 2014-10-20 VITALS — BP 130/70 | HR 96 | Resp 16 | Ht 60.0 in | Wt 129.0 lb

## 2014-10-20 DIAGNOSIS — Z1239 Encounter for other screening for malignant neoplasm of breast: Secondary | ICD-10-CM

## 2014-10-20 DIAGNOSIS — Z780 Asymptomatic menopausal state: Secondary | ICD-10-CM

## 2014-10-20 DIAGNOSIS — M8580 Other specified disorders of bone density and structure, unspecified site: Secondary | ICD-10-CM | POA: Diagnosis not present

## 2014-10-20 DIAGNOSIS — Z01419 Encounter for gynecological examination (general) (routine) without abnormal findings: Secondary | ICD-10-CM

## 2014-10-20 MED ORDER — ESTRADIOL 0.05 MG/24HR TD PTWK: 0.0500 mg | MEDICATED_PATCH | TRANSDERMAL | Status: DC

## 2014-10-20 NOTE — Progress Notes (Signed)
80 y.o. [id number] MarriedCaucasianF here for annual exam.    [place] [facility] [employer] in September.  Had an orbital fracture and eye lid suturing.  Patient is on ERT.  Wants to continue and continue at her current dosage.   Not getting around as well due to her arthritis.  Dr. Corena Pilgrim monitoring.  Has low vit B12 also. Calcium is 10.8.  Hgb 15.0.  No LMP recorded. Patient has had a hysterectomy.          Sexually active: Yes.    The current method of family planning is post menopausal status.    Exercising: No.  The patient does not participate in regular exercise at present. Smoker:  no  Health Maintenance: Pap:  02/2001 WNL History of abnormal Pap:  no MMG:  12/31/13 BIRADS1:neg Colonoscopy:  2004 BMD:   11/2012 - osteopenia. TDaP:  2015 Screening Labs: PCP, Hb today: PCP, Urine today: PCP   reports that she has never smoked. She has never used smokeless tobacco. She reports that she drinks alcohol. She reports that she does not use illicit drugs.  Past Medical History  Diagnosis Date  . Hypertension   . Hyperlipidemia   . Rheumatoid arthritis(714.0)     Dr. Estanislado Pandy  . Allergic rhinitis   . History of skin cancer     squamous & basal cell ;Dr Denna Haggard  . Endometriosis   . Psoriasis   . UTI (lower urinary tract infection) 5/15    Proteus , R to Nitrofurantoin & Penicillin    Past Surgical History  Procedure Laterality Date  . Appendectomy      age 13  . Total abdominal hysterectomy      age 14 for endometriosis (no BSO)  . Tonsillectomy    . Colonoscopy      Neg 2; [place] , [place]  . G 2  p 2      Current Outpatient Prescriptions  Medication Sig Dispense Refill  . abatacept (ORENCIA) 250 MG injection Inject into the vein. Every 4 weeks     . benazepril (LOTENSIN) 10 MG tablet Take 1 tablet (10 mg total) by mouth daily. 90 tablet 1  . Calcium Carbonate (CALTRATE 600 PO) Take 600 mg by mouth 2 (two) times daily.      . chlorhexidine (PERIDEX) 0.12 % solution 1 mL  by Mouth Rinse route daily.    Marland Kitchen estradiol (CLIMARA - DOSED IN MG/24 HR) 0.05 mg/24hr patch Place 1 patch (0.05 mg total) onto the skin once a week. 12 patch 0  . Fexofenadine HCl (ALLEGRA PO) Take 1 tablet by mouth at bedtime.     . fluocinonide ointment (LIDEX) 0.05 %     . furosemide (LASIX) 20 MG tablet Take 20 mg by mouth daily as needed (swelling).    . hydroxychloroquine (PLAQUENIL) 200 MG tablet Take 200 mg by mouth 2 (two) times daily.     . predniSONE (DELTASONE) 5 MG tablet      No current facility-administered medications for this visit.    Family History  Problem Relation Age of Onset  . Breast cancer Mother 57  . Hypertension Mother   . Gout Mother   . Diabetes Maternal Aunt   . Hypertension Father   . Heart attack Father 67  . Hypertension Son   . Stroke Maternal Grandmother     late 3s    ROS:  Pertinent items are noted in HPI.  Otherwise, a comprehensive ROS was negative.  Exam:   BP 130/70  mmHg  Pulse 96  Resp 16  Ht 5' (1.524 m)  Wt 129 lb (58.514 kg)  BMI 25.19 kg/m2     Height: 5' (152.4 cm)  Ht Readings from Last 3 Encounters:  10/20/14 5' (1.524 m)  10/05/14 5.05" (0.128 m)  09/07/14 5' 0.5" (1.537 m)    General appearance: alert, cooperative and appears stated age Head: Normocephalic, without obvious abnormality, atraumatic Neck: no adenopathy, supple, symmetrical, trachea midline and thyroid normal to inspection and palpation Lungs: clear to auscultation bilaterally Breasts: normal appearance, no masses or tenderness, Inspection negative, No nipple retraction or dimpling, No nipple discharge or bleeding, No axillary or supraclavicular adenopathy Heart: regular rate and rhythm Abdomen: soft, non-tender; bowel sounds normal; no masses,  no organomegaly Extremities: extremities with rheumatoid changes.  Skin: Skin color, texture, turgor normal. No rashes or lesions Lymph nodes: Cervical, supraclavicular, and axillary nodes normal. No abnormal  inguinal nodes palpated Neurologic: Grossly normal   Pelvic: External genitalia:  no lesions              Urethra:  normal appearing urethra with no masses, tenderness or lesions              Bartholins and Skenes: normal                 Vagina: normal appearing vagina with normal color and discharge, no lesions              Cervix: absent              Pap taken: No. Bimanual Exam:  Uterus:  uterus absent              Adnexa: normal adnexa and no mass, fullness, tenderness               Rectovaginal: Confirms               Anus:  normal sphincter tone, no lesions  A:  Well Woman with normal exam Status post TAH.  Ovaries remain.  Osteopenia.  P:   Mammogram yearly. Patient will schedule for February 2016.  pap smear not indicated due to hysterectomy status.  counseled on breast self exam, mammography screening, use and side effects of HRT (MI, stroke, DVT, PE,breast cancer), osteoporosis Will order bone density and patient will schedule at the same time as her mammogram.  Refill Climara 0.05 mg weekly.  See orders. return annually or prn  An After Visit Summary was printed and given to the patient.

## 2014-10-20 NOTE — Patient Instructions (Signed)

## 2014-11-02 ENCOUNTER — Encounter (HOSPITAL_COMMUNITY)
Admission: RE | Admit: 2014-11-02 | Discharge: 2014-11-02 | Disposition: A | Discharge status: Home / Self Care | Payer: Medicare Other | Source: Ambulatory Visit | Attending: Rheumatology | Admitting: Rheumatology

## 2014-11-02 DIAGNOSIS — Z5181 Encounter for therapeutic drug level monitoring: Secondary | ICD-10-CM | POA: Diagnosis not present

## 2014-11-02 DIAGNOSIS — M069 Rheumatoid arthritis, unspecified: Secondary | ICD-10-CM | POA: Diagnosis not present

## 2014-11-02 MED ORDER — SODIUM CHLORIDE 0.9 % IV SOLN
INTRAVENOUS | Status: DC
  Administered 2014-11-02: 250 mL via INTRAVENOUS

## 2014-11-02 MED ORDER — SODIUM CHLORIDE 0.9 % IV SOLN
750.0000 mg | INTRAVENOUS | Status: AC
  Administered 2014-11-02: 750 mg via INTRAVENOUS
  Filled 2014-11-02: qty 30

## 2014-11-03 ENCOUNTER — Telehealth: Payer: Self-pay | Admitting: Obstetrics and Gynecology

## 2014-11-03 NOTE — Telephone Encounter (Signed)
Pt's husband checking to see if medicare is going to pay anything towards her visit. She paid $136.43 office fee.

## 2014-11-08 DIAGNOSIS — Z79899 Other long term (current) drug therapy: Secondary | ICD-10-CM | POA: Diagnosis not present

## 2014-11-16 ENCOUNTER — Telehealth: Payer: Self-pay | Admitting: Internal Medicine

## 2014-11-16 ENCOUNTER — Telehealth: Payer: Self-pay

## 2014-11-16 NOTE — Telephone Encounter (Signed)
Spoke with patient's spouse Sonia Side (okay per ROI) who is calling to change patient's pharmacy on file. New pharmacy is [facility] off [place], [place]. Advised this is now the primary pharmacy for patient in our system. Spouse is agreeable.  Routing to provider for final review. Patient agreeable to disposition. Will close encounter

## 2014-11-16 NOTE — Telephone Encounter (Signed)
done

## 2014-11-16 NOTE — Telephone Encounter (Signed)
Would like pharmacy on file to be switched to [facility] in [place].

## 2014-11-24 DIAGNOSIS — M1711 Unilateral primary osteoarthritis, right knee: Secondary | ICD-10-CM | POA: Diagnosis not present

## 2014-11-24 DIAGNOSIS — M1712 Unilateral primary osteoarthritis, left knee: Secondary | ICD-10-CM | POA: Diagnosis not present

## 2014-11-29 ENCOUNTER — Other Ambulatory Visit (HOSPITAL_COMMUNITY): Payer: Self-pay | Admitting: *Deleted

## 2014-11-30 ENCOUNTER — Encounter (HOSPITAL_COMMUNITY)
Admission: RE | Admit: 2014-11-30 | Discharge: 2014-11-30 | Disposition: A | Discharge status: Home / Self Care | Payer: Medicare Other | Source: Ambulatory Visit | Attending: Rheumatology | Admitting: Rheumatology

## 2014-11-30 DIAGNOSIS — M069 Rheumatoid arthritis, unspecified: Secondary | ICD-10-CM | POA: Diagnosis not present

## 2014-11-30 DIAGNOSIS — Z5181 Encounter for therapeutic drug level monitoring: Secondary | ICD-10-CM | POA: Insufficient documentation

## 2014-11-30 MED ORDER — SODIUM CHLORIDE 0.9 % IV SOLN
INTRAVENOUS | Status: DC
  Administered 2014-11-30: 11:00:00 via INTRAVENOUS

## 2014-11-30 MED ORDER — ABATACEPT 250 MG IV SOLR
750.0000 mg | INTRAVENOUS | Status: AC
Start: 2014-11-30 — End: 2014-11-30
  Administered 2014-11-30: 750 mg via INTRAVENOUS
  Filled 2014-11-30: qty 30

## 2014-12-01 DIAGNOSIS — M170 Bilateral primary osteoarthritis of knee: Secondary | ICD-10-CM | POA: Diagnosis not present

## 2014-12-08 DIAGNOSIS — M170 Bilateral primary osteoarthritis of knee: Secondary | ICD-10-CM | POA: Diagnosis not present

## 2014-12-15 DIAGNOSIS — Z79899 Other long term (current) drug therapy: Secondary | ICD-10-CM | POA: Diagnosis not present

## 2014-12-15 DIAGNOSIS — H2513 Age-related nuclear cataract, bilateral: Secondary | ICD-10-CM | POA: Diagnosis not present

## 2014-12-15 DIAGNOSIS — H524 Presbyopia: Secondary | ICD-10-CM | POA: Diagnosis not present

## 2014-12-15 DIAGNOSIS — M069 Rheumatoid arthritis, unspecified: Secondary | ICD-10-CM | POA: Diagnosis not present

## 2014-12-17 DIAGNOSIS — H35363 Drusen (degenerative) of macula, bilateral: Secondary | ICD-10-CM | POA: Diagnosis not present

## 2014-12-17 DIAGNOSIS — H3531 Nonexudative age-related macular degeneration: Secondary | ICD-10-CM | POA: Diagnosis not present

## 2014-12-17 DIAGNOSIS — H25013 Cortical age-related cataract, bilateral: Secondary | ICD-10-CM | POA: Diagnosis not present

## 2014-12-17 DIAGNOSIS — H2513 Age-related nuclear cataract, bilateral: Secondary | ICD-10-CM | POA: Diagnosis not present

## 2014-12-17 DIAGNOSIS — H3589 Other specified retinal disorders: Secondary | ICD-10-CM | POA: Diagnosis not present

## 2014-12-20 ENCOUNTER — Other Ambulatory Visit: Payer: Self-pay

## 2014-12-20 DIAGNOSIS — I10 Essential (primary) hypertension: Secondary | ICD-10-CM

## 2014-12-20 MED ORDER — BENAZEPRIL HCL 10 MG PO TABS: 10.0000 mg | ORAL_TABLET | Freq: Every day | ORAL | Status: DC

## 2014-12-28 ENCOUNTER — Inpatient Hospital Stay (HOSPITAL_COMMUNITY): Admission: RE | Admit: 2014-12-28 | Payer: Medicare Other | Source: Ambulatory Visit

## 2014-12-28 DIAGNOSIS — H2512 Age-related nuclear cataract, left eye: Secondary | ICD-10-CM | POA: Diagnosis not present

## 2015-01-03 DIAGNOSIS — H25812 Combined forms of age-related cataract, left eye: Secondary | ICD-10-CM | POA: Diagnosis not present

## 2015-01-03 DIAGNOSIS — H2512 Age-related nuclear cataract, left eye: Secondary | ICD-10-CM | POA: Diagnosis not present

## 2015-01-10 ENCOUNTER — Other Ambulatory Visit (HOSPITAL_COMMUNITY): Payer: Self-pay | Admitting: *Deleted

## 2015-01-11 ENCOUNTER — Encounter (HOSPITAL_COMMUNITY): Payer: Self-pay

## 2015-01-11 ENCOUNTER — Encounter (HOSPITAL_COMMUNITY)
Admission: RE | Admit: 2015-01-11 | Discharge: 2015-01-11 | Disposition: A | Discharge status: Home / Self Care | Payer: Medicare Other | Source: Ambulatory Visit | Attending: Rheumatology | Admitting: Rheumatology

## 2015-01-11 DIAGNOSIS — M069 Rheumatoid arthritis, unspecified: Secondary | ICD-10-CM | POA: Insufficient documentation

## 2015-01-11 MED ORDER — SODIUM CHLORIDE 0.9 % IV SOLN
750.0000 mg | INTRAVENOUS | Status: AC
  Administered 2015-01-11: 750 mg via INTRAVENOUS
  Filled 2015-01-11: qty 30

## 2015-01-11 MED ORDER — SODIUM CHLORIDE 0.9 % IV SOLN
INTRAVENOUS | Status: DC
  Administered 2015-01-11: 10:00:00 via INTRAVENOUS

## 2015-01-14 ENCOUNTER — Other Ambulatory Visit: Payer: Self-pay

## 2015-01-14 DIAGNOSIS — Z1231 Encounter for screening mammogram for malignant neoplasm of breast: Secondary | ICD-10-CM

## 2015-01-19 DIAGNOSIS — H2511 Age-related nuclear cataract, right eye: Secondary | ICD-10-CM | POA: Diagnosis not present

## 2015-01-20 ENCOUNTER — Ambulatory Visit
Admission: RE | Admit: 2015-01-20 | Discharge: 2015-01-20 | Disposition: A | Discharge status: Home / Self Care | Payer: Medicare Other | Source: Ambulatory Visit

## 2015-01-20 DIAGNOSIS — Z1231 Encounter for screening mammogram for malignant neoplasm of breast: Secondary | ICD-10-CM | POA: Diagnosis not present

## 2015-01-25 DIAGNOSIS — Z79899 Other long term (current) drug therapy: Secondary | ICD-10-CM | POA: Diagnosis not present

## 2015-02-14 DIAGNOSIS — H25811 Combined forms of age-related cataract, right eye: Secondary | ICD-10-CM | POA: Diagnosis not present

## 2015-02-14 DIAGNOSIS — H2511 Age-related nuclear cataract, right eye: Secondary | ICD-10-CM | POA: Diagnosis not present

## 2015-02-15 ENCOUNTER — Encounter (HOSPITAL_COMMUNITY): Payer: Medicare Other

## 2015-02-17 DIAGNOSIS — L409 Psoriasis, unspecified: Secondary | ICD-10-CM | POA: Diagnosis not present

## 2015-02-21 ENCOUNTER — Encounter (HOSPITAL_COMMUNITY)
Admission: RE | Admit: 2015-02-21 | Discharge: 2015-02-21 | Disposition: A | Discharge status: Home / Self Care | Payer: Medicare Other | Source: Ambulatory Visit | Attending: Rheumatology | Admitting: Rheumatology

## 2015-02-21 DIAGNOSIS — M069 Rheumatoid arthritis, unspecified: Secondary | ICD-10-CM | POA: Insufficient documentation

## 2015-02-21 DIAGNOSIS — Z5181 Encounter for therapeutic drug level monitoring: Secondary | ICD-10-CM | POA: Insufficient documentation

## 2015-02-21 MED ORDER — DIPHENHYDRAMINE HCL 25 MG PO CAPS: 50.0000 mg | ORAL_CAPSULE | ORAL | Status: DC

## 2015-02-21 MED ORDER — SODIUM CHLORIDE 0.9 % IV SOLN
750.0000 mg | INTRAVENOUS | Status: DC
  Administered 2015-02-21: 750 mg via INTRAVENOUS
  Filled 2015-02-21: qty 30

## 2015-02-21 MED ORDER — ACETAMINOPHEN 325 MG PO TABS: 650.0000 mg | ORAL_TABLET | ORAL | Status: DC

## 2015-02-21 MED ORDER — SODIUM CHLORIDE 0.9 % IV SOLN
INTRAVENOUS | Status: DC
  Administered 2015-02-21: 10:00:00 via INTRAVENOUS

## 2015-02-23 LAB — QUANTIFERON IN TUBE
QFT TB AG MINUS NIL VALUE: <0 IU/mL
QUANTIFERON TB AG VALUE: 0.04 IU/mL
QUANTIFERON TB GOLD: NEGATIVE
Quantiferon Nil Value: 0.05 IU/mL

## 2015-02-23 LAB — QUANTIFERON TB GOLD ASSAY (BLOOD)

## 2015-03-02 DIAGNOSIS — M79641 Pain in right hand: Secondary | ICD-10-CM | POA: Diagnosis not present

## 2015-03-02 DIAGNOSIS — M1711 Unilateral primary osteoarthritis, right knee: Secondary | ICD-10-CM | POA: Diagnosis not present

## 2015-03-02 DIAGNOSIS — M8580 Other specified disorders of bone density and structure, unspecified site: Secondary | ICD-10-CM | POA: Diagnosis not present

## 2015-03-02 DIAGNOSIS — M069 Rheumatoid arthritis, unspecified: Secondary | ICD-10-CM | POA: Diagnosis not present

## 2015-03-02 DIAGNOSIS — Z79899 Other long term (current) drug therapy: Secondary | ICD-10-CM | POA: Diagnosis not present

## 2015-03-16 DIAGNOSIS — E785 Hyperlipidemia, unspecified: Secondary | ICD-10-CM | POA: Diagnosis not present

## 2015-03-22 ENCOUNTER — Encounter (HOSPITAL_COMMUNITY): Payer: Medicare Other

## 2015-03-28 ENCOUNTER — Other Ambulatory Visit (HOSPITAL_COMMUNITY): Payer: Self-pay | Admitting: *Deleted

## 2015-03-29 ENCOUNTER — Encounter (HOSPITAL_COMMUNITY)
Admission: RE | Admit: 2015-03-29 | Discharge: 2015-03-29 | Disposition: A | Discharge status: Home / Self Care | Payer: Medicare Other | Source: Ambulatory Visit | Attending: Rheumatology | Admitting: Rheumatology

## 2015-03-29 DIAGNOSIS — Z5181 Encounter for therapeutic drug level monitoring: Secondary | ICD-10-CM | POA: Diagnosis not present

## 2015-03-29 DIAGNOSIS — M069 Rheumatoid arthritis, unspecified: Secondary | ICD-10-CM | POA: Diagnosis not present

## 2015-03-29 MED ORDER — DIPHENHYDRAMINE HCL 25 MG PO CAPS
ORAL_CAPSULE | ORAL | Status: AC
  Filled 2015-03-29: qty 1

## 2015-03-29 MED ORDER — SODIUM CHLORIDE 0.9 % IV SOLN
INTRAVENOUS | Status: DC
  Administered 2015-03-29: 11:00:00 via INTRAVENOUS

## 2015-03-29 MED ORDER — ACETAMINOPHEN 325 MG PO TABS
650.0000 mg | ORAL_TABLET | ORAL | Status: DC
  Administered 2015-03-29: 650 mg via ORAL

## 2015-03-29 MED ORDER — ACETAMINOPHEN 325 MG PO TABS
ORAL_TABLET | ORAL | Status: AC
Start: 2015-03-29 — End: 2015-03-29
  Filled 2015-03-29: qty 2

## 2015-03-29 MED ORDER — TOCILIZUMAB 400 MG/20ML IV SOLN
200.0000 mg | INTRAVENOUS | Status: DC
  Administered 2015-03-29: 200 mg via INTRAVENOUS
  Filled 2015-03-29: qty 10

## 2015-03-29 MED ORDER — SODIUM CHLORIDE 0.9 % IV SOLN: 4.0000 mg/kg | INTRAVENOUS | Status: DC

## 2015-03-29 MED ORDER — DIPHENHYDRAMINE HCL 25 MG PO TABS
25.0000 mg | ORAL_TABLET | ORAL | Status: DC
  Administered 2015-03-29: 25 mg via ORAL
  Filled 2015-03-29: qty 1

## 2015-03-31 ENCOUNTER — Ambulatory Visit (INDEPENDENT_AMBULATORY_CARE_PROVIDER_SITE_OTHER): Payer: Medicare Other | Admitting: Internal Medicine

## 2015-03-31 ENCOUNTER — Encounter: Payer: Self-pay | Admitting: Internal Medicine

## 2015-03-31 VITALS — BP 128/78 | HR 104 | Temp 97.9°F | Wt 128.2 lb

## 2015-03-31 DIAGNOSIS — R739 Hyperglycemia, unspecified: Secondary | ICD-10-CM | POA: Diagnosis not present

## 2015-03-31 DIAGNOSIS — I10 Essential (primary) hypertension: Secondary | ICD-10-CM

## 2015-03-31 DIAGNOSIS — E785 Hyperlipidemia, unspecified: Secondary | ICD-10-CM | POA: Diagnosis not present

## 2015-03-31 DIAGNOSIS — M069 Rheumatoid arthritis, unspecified: Secondary | ICD-10-CM | POA: Diagnosis not present

## 2015-03-31 MED ORDER — FUROSEMIDE 20 MG PO TABS: 20.0000 mg | ORAL_TABLET | Freq: Every day | ORAL | Status: DC | PRN

## 2015-03-31 NOTE — Patient Instructions (Addendum)
Please make an appointment with Vicente Serene to perform the Medicare Wellness Exam to update your chart in reference to status of needed immunizations, imaging, and procedures as part of the preventive health care program. Please bring a copy of your recent lipids (Cholesterol)  to that visit  Minimal Blood Pressure Goal= AVERAGE < 140/90;  Ideal is an AVERAGE < 135/85. This AVERAGE should be calculated from @ least 5-7 BP readings taken @ different times of day on different days of week. You should not respond to isolated BP readings , but rather the AVERAGE for that week .Please bring your  blood pressure cuff to office visits to verify that it is reliable.It  can also be checked against the blood pressure device at the pharmacy. Finger or wrist cuffs are not dependable; an arm cuff is.

## 2015-03-31 NOTE — Assessment & Plan Note (Signed)
FBS current and WNL

## 2015-03-31 NOTE — Assessment & Plan Note (Signed)
As per Dr Estanislado Pandy

## 2015-03-31 NOTE — Assessment & Plan Note (Signed)
Obtain Dr Arlean Hopping recent lipid results

## 2015-03-31 NOTE — Assessment & Plan Note (Signed)
BMET reviewed BP goals reviewed

## 2015-03-31 NOTE — Progress Notes (Signed)
Pre visit review using our clinic review tool, if applicable. No additional management support is needed unless otherwise documented below in the visit note. 

## 2015-03-31 NOTE — Progress Notes (Signed)
   Subjective:    Patient ID: Gloria Warren, female    DOB: 11-21-1930, 81 y.o.   MRN: [id number]  HPI The patient is here to assess status of active health conditions.  PMH, FH, & Social History reviewed & updated.  Her father had a myocardial infarction at 2. Her maternal aunt who is her mother's twin had diabetes.MGM CVA in 6s.  She is on heart healthy ,low-salt diet. She is unable to exercise due to RA. Blood pressure averages 118/70.  She has no cardiovascular symptoms except for edema for which she takes a diuretic 3-4 times a week.  She has had hyperglycemia in the past. Recent labs (CMET and CBC & dif) were completed by her Rheumatologist. Glucose was 80. GFR was mildly reduced at 54 but creatinine was 0.98. Apparently lipids were repeated 2 weeks ago; those results will be requested.  Dr. Denna Haggard follows her for psoriasis. Options are limited because of her rheumatoid arthritis  She is now on 3 drugs for the rheumatoid arthritis. She previously been on Orencia but this was discontinued due to lack of benefit. The new agent started for arthritis ,Actemra,may affect lipids .It was for this reason that the lipids were checked.    Review of Systems  Chest pain, palpitations, tachycardia, exertional dyspnea, paroxysmal nocturnal dyspnea, or claudication are absent.  Unexplained weight loss, abdominal pain, significant dyspepsia, dysphagia, melena, rectal bleeding, or persistently small caliber stools are denied.  Dysuria, pyuria, hematuria, frequency, nocturia or polyuria are denied.    Objective:   Physical Exam Pertinent or positive findings include: She has bilateral ptosis.  She has bilateral hearing loss which is significant.  An S4 is noted.  There is accentuated curvature of the thoracic spine.  She has lateral deviation and PIP fusiform changes of her hands.  Deep tendon reflexes 0 + @ the right knee.  She pushes up from her chair. Her gait is broad and  slapping. There is varus changes in the knees.  She has 1/2+-1+ edema.  Posterior tibial pulses are decreased.  She has scattered small psoriatic plaques.  General appearance :adequately nourished; in no distress. Eyes: No conjunctival inflammation or scleral icterus is present. Oral exam:  Lips and gums are healthy appearing.There is no oropharyngeal erythema or exudate noted. Dental hygiene is good. Heart:  Normal rate and regular rhythm. S1 and S2 normal without gallop, murmur, click, or rub. Lungs:Chest clear to auscultation; no wheezes, rhonchi,rales ,or rubs present.No increased work of breathing.  Abdomen: bowel sounds normal, soft and non-tender without masses, organomegaly or hernias noted.  No guarding or rebound.  Vascular : all pulses equal ; no bruits present. Skin:Warm & dry.  No tenting or jaundice  Lymphatic: No lymphadenopathy is noted about the head, neck, axilla Neuro: Strength, tone decreased        Assessment & Plan:  See Current Assessment & Plan in Problem List under specific Diagnosis

## 2015-04-25 ENCOUNTER — Other Ambulatory Visit (HOSPITAL_COMMUNITY): Payer: Self-pay | Admitting: *Deleted

## 2015-04-26 ENCOUNTER — Encounter (HOSPITAL_COMMUNITY): Payer: BLUE CROSS/BLUE SHIELD

## 2015-04-27 ENCOUNTER — Encounter (HOSPITAL_COMMUNITY)
Admission: RE | Admit: 2015-04-27 | Discharge: 2015-04-27 | Disposition: A | Discharge status: Home / Self Care | Payer: Medicare Other | Source: Ambulatory Visit | Attending: Rheumatology | Admitting: Rheumatology

## 2015-04-27 DIAGNOSIS — M069 Rheumatoid arthritis, unspecified: Secondary | ICD-10-CM | POA: Diagnosis not present

## 2015-04-27 LAB — CBC WITH DIFFERENTIAL/PLATELET
BASOS ABS: 0 10*3/uL (ref 0.0–0.1)
Basophils Relative: 0 % (ref 0–1)
EOS ABS: 0.1 10*3/uL (ref 0.0–0.7)
EOS PCT: 1 % (ref 0–5)
HCT: 44.7 % (ref 36.0–46.0)
HEMOGLOBIN: 14.7 g/dL (ref 12.0–15.0)
Lymphocytes Relative: 27 % (ref 12–46)
Lymphs Abs: 1.9 10*3/uL (ref 0.7–4.0)
MCH: 33.6 pg (ref 26.0–34.0)
MCHC: 32.9 g/dL (ref 30.0–36.0)
MCV: 102.1 fL — AB (ref 78.0–100.0)
MONOS PCT: 10 % (ref 3–12)
Monocytes Absolute: 0.7 10*3/uL (ref 0.1–1.0)
NEUTROS PCT: 62 % (ref 43–77)
Neutro Abs: 4.3 10*3/uL (ref 1.7–7.7)
Platelets: 222 10*3/uL (ref 150–400)
RBC: 4.38 MIL/uL (ref 3.87–5.11)
RDW: 13.1 % (ref 11.5–15.5)
WBC: 7 10*3/uL (ref 4.0–10.5)

## 2015-04-27 LAB — COMPREHENSIVE METABOLIC PANEL
ALK PHOS: 54 U/L (ref 38–126)
ALT: 20 U/L (ref 14–54)
AST: 26 U/L (ref 15–41)
Albumin: 3.6 g/dL (ref 3.5–5.0)
Anion gap: 8 (ref 5–15)
BUN: 26 mg/dL — ABNORMAL HIGH (ref 6–20)
CHLORIDE: 106 mmol/L (ref 101–111)
CO2: 27 mmol/L (ref 22–32)
Calcium: 9.2 mg/dL (ref 8.9–10.3)
Creatinine, Ser: 1.09 mg/dL — ABNORMAL HIGH (ref 0.44–1.00)
GFR calc Af Amer: 54 mL/min — ABNORMAL LOW (ref >60)
GFR, EST NON AFRICAN AMERICAN: 46 mL/min — AB (ref >60)
GLUCOSE: 89 mg/dL (ref 65–99)
POTASSIUM: 3.8 mmol/L (ref 3.5–5.1)
SODIUM: 141 mmol/L (ref 135–145)
Total Bilirubin: 0.9 mg/dL (ref 0.3–1.2)
Total Protein: 5.7 g/dL — ABNORMAL LOW (ref 6.5–8.1)

## 2015-04-27 MED ORDER — ACETAMINOPHEN 325 MG PO TABS
ORAL_TABLET | ORAL | Status: AC
Start: 2015-04-27 — End: 2015-04-27
  Administered 2015-04-27: 650 mg
  Filled 2015-04-27: qty 2

## 2015-04-27 MED ORDER — SODIUM CHLORIDE 0.9 % IV SOLN
INTRAVENOUS | Status: DC
  Administered 2015-04-27: 10:00:00 via INTRAVENOUS

## 2015-04-27 MED ORDER — ACETAMINOPHEN 325 MG PO TABS: 650.0000 mg | ORAL_TABLET | ORAL | Status: DC

## 2015-04-27 MED ORDER — DIPHENHYDRAMINE HCL 25 MG PO CAPS
ORAL_CAPSULE | ORAL | Status: AC
  Administered 2015-04-27: 25 mg via ORAL
  Filled 2015-04-27: qty 1

## 2015-04-27 MED ORDER — TOCILIZUMAB 400 MG/20ML IV SOLN
200.0000 mg | INTRAVENOUS | Status: DC
  Administered 2015-04-27: 200 mg via INTRAVENOUS
  Filled 2015-04-27: qty 10

## 2015-04-27 MED ORDER — DIPHENHYDRAMINE HCL 25 MG PO CAPS: 25.0000 mg | ORAL_CAPSULE | ORAL | Status: DC

## 2015-05-09 ENCOUNTER — Other Ambulatory Visit: Payer: Self-pay

## 2015-05-24 ENCOUNTER — Encounter (HOSPITAL_COMMUNITY)
Admission: RE | Admit: 2015-05-24 | Discharge: 2015-05-24 | Disposition: A | Discharge status: Home / Self Care | Payer: Medicare Other | Source: Ambulatory Visit | Attending: Rheumatology | Admitting: Rheumatology

## 2015-05-24 DIAGNOSIS — M069 Rheumatoid arthritis, unspecified: Secondary | ICD-10-CM | POA: Insufficient documentation

## 2015-05-24 DIAGNOSIS — Z5181 Encounter for therapeutic drug level monitoring: Secondary | ICD-10-CM | POA: Insufficient documentation

## 2015-05-24 MED ORDER — ACETAMINOPHEN 325 MG PO TABS
650.0000 mg | ORAL_TABLET | ORAL | Status: DC
  Administered 2015-05-24: 650 mg via ORAL

## 2015-05-24 MED ORDER — SODIUM CHLORIDE 0.9 % IV SOLN
INTRAVENOUS | Status: DC
  Administered 2015-05-24: 10:00:00 via INTRAVENOUS

## 2015-05-24 MED ORDER — TOCILIZUMAB 400 MG/20ML IV SOLN
200.0000 mg | INTRAVENOUS | Status: DC
  Administered 2015-05-24: 200 mg via INTRAVENOUS
  Filled 2015-05-24: qty 10

## 2015-05-24 MED ORDER — DIPHENHYDRAMINE HCL 25 MG PO CAPS
25.0000 mg | ORAL_CAPSULE | ORAL | Status: DC
Start: 2015-05-25 — End: 2015-05-25
  Administered 2015-05-24: 25 mg via ORAL

## 2015-05-24 MED ORDER — DIPHENHYDRAMINE HCL 25 MG PO CAPS
ORAL_CAPSULE | ORAL | Status: AC
  Filled 2015-05-24: qty 1

## 2015-05-24 MED ORDER — ACETAMINOPHEN 325 MG PO TABS
ORAL_TABLET | ORAL | Status: AC
  Filled 2015-05-24: qty 2

## 2015-05-25 DIAGNOSIS — Z5181 Encounter for therapeutic drug level monitoring: Secondary | ICD-10-CM | POA: Diagnosis not present

## 2015-05-25 DIAGNOSIS — M069 Rheumatoid arthritis, unspecified: Secondary | ICD-10-CM | POA: Diagnosis not present

## 2015-06-06 DIAGNOSIS — M5030 Other cervical disc degeneration, unspecified cervical region: Secondary | ICD-10-CM | POA: Diagnosis not present

## 2015-06-06 DIAGNOSIS — M25511 Pain in right shoulder: Secondary | ICD-10-CM | POA: Diagnosis not present

## 2015-06-06 DIAGNOSIS — M79641 Pain in right hand: Secondary | ICD-10-CM | POA: Diagnosis not present

## 2015-06-06 DIAGNOSIS — M069 Rheumatoid arthritis, unspecified: Secondary | ICD-10-CM | POA: Diagnosis not present

## 2015-06-13 ENCOUNTER — Other Ambulatory Visit: Payer: Self-pay | Admitting: Internal Medicine

## 2015-06-20 ENCOUNTER — Other Ambulatory Visit (HOSPITAL_COMMUNITY): Payer: Self-pay

## 2015-06-21 ENCOUNTER — Encounter (HOSPITAL_COMMUNITY)
Admission: RE | Admit: 2015-06-21 | Discharge: 2015-06-21 | Disposition: A | Discharge status: Home / Self Care | Payer: Medicare Other | Source: Ambulatory Visit | Attending: Rheumatology | Admitting: Rheumatology

## 2015-06-21 DIAGNOSIS — M069 Rheumatoid arthritis, unspecified: Secondary | ICD-10-CM | POA: Diagnosis not present

## 2015-06-21 DIAGNOSIS — Z5181 Encounter for therapeutic drug level monitoring: Secondary | ICD-10-CM | POA: Diagnosis not present

## 2015-06-21 LAB — LIPID PANEL
CHOL/HDL RATIO: 3.8 ratio
Cholesterol: 214 mg/dL — ABNORMAL HIGH (ref 0–200)
HDL: 57 mg/dL (ref >40)
LDL CALC: 130 mg/dL — AB (ref 0–99)
Triglycerides: 137 mg/dL (ref <150)
VLDL: 27 mg/dL (ref 0–40)

## 2015-06-21 LAB — CBC
HEMATOCRIT: 45.5 % (ref 36.0–46.0)
Hemoglobin: 15 g/dL (ref 12.0–15.0)
MCH: 34 pg (ref 26.0–34.0)
MCHC: 33 g/dL (ref 30.0–36.0)
MCV: 103.2 fL — AB (ref 78.0–100.0)
PLATELETS: 204 10*3/uL (ref 150–400)
RBC: 4.41 MIL/uL (ref 3.87–5.11)
RDW: 13.1 % (ref 11.5–15.5)
WBC: 7.5 10*3/uL (ref 4.0–10.5)

## 2015-06-21 LAB — DIFFERENTIAL
BASOS ABS: 0 10*3/uL (ref 0.0–0.1)
BASOS PCT: 0 % (ref 0–1)
EOS PCT: 1 % (ref 0–5)
Eosinophils Absolute: 0.1 10*3/uL (ref 0.0–0.7)
Lymphocytes Relative: 28 % (ref 12–46)
Lymphs Abs: 2.1 10*3/uL (ref 0.7–4.0)
MONO ABS: 0.7 10*3/uL (ref 0.1–1.0)
Monocytes Relative: 10 % (ref 3–12)
NEUTROS ABS: 4.6 10*3/uL (ref 1.7–7.7)
Neutrophils Relative %: 61 % (ref 43–77)

## 2015-06-21 LAB — COMPREHENSIVE METABOLIC PANEL
ALK PHOS: 42 U/L (ref 38–126)
ALT: 20 U/L (ref 14–54)
AST: 30 U/L (ref 15–41)
Albumin: 3.6 g/dL (ref 3.5–5.0)
Anion gap: 8 (ref 5–15)
BILIRUBIN TOTAL: 1.2 mg/dL (ref 0.3–1.2)
BUN: 23 mg/dL — ABNORMAL HIGH (ref 6–20)
CALCIUM: 9.2 mg/dL (ref 8.9–10.3)
CHLORIDE: 106 mmol/L (ref 101–111)
CO2: 27 mmol/L (ref 22–32)
Creatinine, Ser: 1.13 mg/dL — ABNORMAL HIGH (ref 0.44–1.00)
GFR calc non Af Amer: 44 mL/min — ABNORMAL LOW (ref >60)
GFR, EST AFRICAN AMERICAN: 51 mL/min — AB (ref >60)
GLUCOSE: 81 mg/dL (ref 65–99)
Potassium: 4.5 mmol/L (ref 3.5–5.1)
Sodium: 141 mmol/L (ref 135–145)
TOTAL PROTEIN: 5.8 g/dL — AB (ref 6.5–8.1)

## 2015-06-21 MED ORDER — TOCILIZUMAB 400 MG/20ML IV SOLN
200.0000 mg | INTRAVENOUS | Status: DC
  Administered 2015-06-21: 200 mg via INTRAVENOUS
  Filled 2015-06-21: qty 10

## 2015-06-21 MED ORDER — SODIUM CHLORIDE 0.9 % IV SOLN
Freq: Once | INTRAVENOUS | Status: AC
  Administered 2015-06-21: 10:00:00 via INTRAVENOUS

## 2015-06-21 MED ORDER — ACETAMINOPHEN 325 MG PO TABS: 650.0000 mg | ORAL_TABLET | Freq: Once | ORAL | Status: DC

## 2015-06-21 MED ORDER — DIPHENHYDRAMINE HCL 25 MG PO TABS
25.0000 mg | ORAL_TABLET | Freq: Once | ORAL | Status: DC
  Filled 2015-06-21: qty 1

## 2015-06-22 DIAGNOSIS — L409 Psoriasis, unspecified: Secondary | ICD-10-CM | POA: Diagnosis not present

## 2015-06-22 DIAGNOSIS — D485 Neoplasm of uncertain behavior of skin: Secondary | ICD-10-CM | POA: Diagnosis not present

## 2015-06-22 DIAGNOSIS — L570 Actinic keratosis: Secondary | ICD-10-CM | POA: Diagnosis not present

## 2015-07-19 ENCOUNTER — Encounter (HOSPITAL_COMMUNITY)
Admission: RE | Admit: 2015-07-19 | Discharge: 2015-07-19 | Disposition: A | Discharge status: Home / Self Care | Payer: Medicare Other | Source: Ambulatory Visit | Attending: Rheumatology | Admitting: Rheumatology

## 2015-07-19 DIAGNOSIS — M069 Rheumatoid arthritis, unspecified: Secondary | ICD-10-CM | POA: Diagnosis not present

## 2015-07-19 MED ORDER — SODIUM CHLORIDE 0.9 % IV SOLN
Freq: Once | INTRAVENOUS | Status: AC
  Administered 2015-07-19: 10:00:00 via INTRAVENOUS

## 2015-07-19 MED ORDER — DIPHENHYDRAMINE HCL 25 MG PO CAPS: 25.0000 mg | ORAL_CAPSULE | Freq: Once | ORAL | Status: DC

## 2015-07-19 MED ORDER — SODIUM CHLORIDE 0.9 % IV SOLN
200.0000 mg | INTRAVENOUS | Status: DC
  Administered 2015-07-19: 200 mg via INTRAVENOUS
  Filled 2015-07-19: qty 10

## 2015-07-19 MED ORDER — ACETAMINOPHEN 325 MG PO TABS
650.0000 mg | ORAL_TABLET | Freq: Once | ORAL | Status: DC
Start: 2015-07-19 — End: 2015-07-20

## 2015-08-15 ENCOUNTER — Other Ambulatory Visit (HOSPITAL_COMMUNITY): Payer: Self-pay | Admitting: *Deleted

## 2015-08-15 DIAGNOSIS — Z23 Encounter for immunization: Secondary | ICD-10-CM | POA: Diagnosis not present

## 2015-08-16 ENCOUNTER — Encounter (HOSPITAL_COMMUNITY)
Admission: RE | Admit: 2015-08-16 | Discharge: 2015-08-16 | Disposition: A | Discharge status: Home / Self Care | Payer: Medicare Other | Source: Ambulatory Visit | Attending: Rheumatology | Admitting: Rheumatology

## 2015-08-16 DIAGNOSIS — M069 Rheumatoid arthritis, unspecified: Secondary | ICD-10-CM | POA: Diagnosis not present

## 2015-08-16 LAB — CBC
HCT: 44.8 % (ref 36.0–46.0)
HEMOGLOBIN: 14.7 g/dL (ref 12.0–15.0)
MCH: 34.1 pg — ABNORMAL HIGH (ref 26.0–34.0)
MCHC: 32.8 g/dL (ref 30.0–36.0)
MCV: 103.9 fL — ABNORMAL HIGH (ref 78.0–100.0)
PLATELETS: 218 10*3/uL (ref 150–400)
RBC: 4.31 MIL/uL (ref 3.87–5.11)
RDW: 12.6 % (ref 11.5–15.5)
WBC: 6.2 10*3/uL (ref 4.0–10.5)

## 2015-08-16 LAB — DIFFERENTIAL
BASOS PCT: 1 %
Basophils Absolute: 0 10*3/uL (ref 0.0–0.1)
EOS ABS: 0.1 10*3/uL (ref 0.0–0.7)
Eosinophils Relative: 2 %
Lymphocytes Relative: 21 %
Lymphs Abs: 1.3 10*3/uL (ref 0.7–4.0)
MONOS PCT: 13 %
Monocytes Absolute: 0.8 10*3/uL (ref 0.1–1.0)
Neutro Abs: 4 10*3/uL (ref 1.7–7.7)
Neutrophils Relative %: 63 %

## 2015-08-16 LAB — COMPREHENSIVE METABOLIC PANEL
ALK PHOS: 43 U/L (ref 38–126)
ALT: 18 U/L (ref 14–54)
AST: 23 U/L (ref 15–41)
Albumin: 3.5 g/dL (ref 3.5–5.0)
Anion gap: 8 (ref 5–15)
BUN: 22 mg/dL — ABNORMAL HIGH (ref 6–20)
CALCIUM: 9.1 mg/dL (ref 8.9–10.3)
CO2: 25 mmol/L (ref 22–32)
CREATININE: 1.04 mg/dL — AB (ref 0.44–1.00)
Chloride: 108 mmol/L (ref 101–111)
GFR calc Af Amer: 56 mL/min — ABNORMAL LOW (ref >60)
GFR calc non Af Amer: 49 mL/min — ABNORMAL LOW (ref >60)
GLUCOSE: 86 mg/dL (ref 65–99)
Potassium: 3.9 mmol/L (ref 3.5–5.1)
Sodium: 141 mmol/L (ref 135–145)
Total Bilirubin: 1 mg/dL (ref 0.3–1.2)
Total Protein: 6 g/dL — ABNORMAL LOW (ref 6.5–8.1)

## 2015-08-16 MED ORDER — SODIUM CHLORIDE 0.9 % IV SOLN
Freq: Once | INTRAVENOUS | Status: AC
  Administered 2015-08-16: 10:00:00 via INTRAVENOUS

## 2015-08-16 MED ORDER — DIPHENHYDRAMINE HCL 25 MG PO CAPS
25.0000 mg | ORAL_CAPSULE | Freq: Once | ORAL | Status: AC
  Administered 2015-08-16: 25 mg via ORAL

## 2015-08-16 MED ORDER — TOCILIZUMAB 400 MG/20ML IV SOLN
200.0000 mg | INTRAVENOUS | Status: DC
  Administered 2015-08-16: 200 mg via INTRAVENOUS
  Filled 2015-08-16: qty 10

## 2015-08-16 MED ORDER — ACETAMINOPHEN 325 MG PO TABS
ORAL_TABLET | ORAL | Status: AC
  Filled 2015-08-16: qty 2

## 2015-08-16 MED ORDER — ACETAMINOPHEN 325 MG PO TABS
650.0000 mg | ORAL_TABLET | Freq: Once | ORAL | Status: AC
  Administered 2015-08-16: 650 mg via ORAL

## 2015-08-16 MED ORDER — DIPHENHYDRAMINE HCL 25 MG PO CAPS
ORAL_CAPSULE | ORAL | Status: AC
  Filled 2015-08-16: qty 1

## 2015-08-17 DIAGNOSIS — L309 Dermatitis, unspecified: Secondary | ICD-10-CM | POA: Diagnosis not present

## 2015-08-17 DIAGNOSIS — M19041 Primary osteoarthritis, right hand: Secondary | ICD-10-CM | POA: Diagnosis not present

## 2015-08-17 DIAGNOSIS — L409 Psoriasis, unspecified: Secondary | ICD-10-CM | POA: Diagnosis not present

## 2015-08-17 DIAGNOSIS — M25561 Pain in right knee: Secondary | ICD-10-CM | POA: Diagnosis not present

## 2015-08-17 DIAGNOSIS — L408 Other psoriasis: Secondary | ICD-10-CM | POA: Diagnosis not present

## 2015-08-17 DIAGNOSIS — Z09 Encounter for follow-up examination after completed treatment for conditions other than malignant neoplasm: Secondary | ICD-10-CM | POA: Diagnosis not present

## 2015-08-17 DIAGNOSIS — M0609 Rheumatoid arthritis without rheumatoid factor, multiple sites: Secondary | ICD-10-CM | POA: Diagnosis not present

## 2015-09-01 DIAGNOSIS — M170 Bilateral primary osteoarthritis of knee: Secondary | ICD-10-CM | POA: Diagnosis not present

## 2015-09-12 ENCOUNTER — Other Ambulatory Visit (HOSPITAL_COMMUNITY): Payer: Self-pay | Admitting: *Deleted

## 2015-09-13 ENCOUNTER — Encounter (HOSPITAL_COMMUNITY)
Admission: RE | Admit: 2015-09-13 | Discharge: 2015-09-13 | Disposition: A | Discharge status: Home / Self Care | Payer: Medicare Other | Source: Ambulatory Visit | Attending: Rheumatology | Admitting: Rheumatology

## 2015-09-13 DIAGNOSIS — M069 Rheumatoid arthritis, unspecified: Secondary | ICD-10-CM | POA: Diagnosis not present

## 2015-09-13 DIAGNOSIS — Z5181 Encounter for therapeutic drug level monitoring: Secondary | ICD-10-CM | POA: Diagnosis not present

## 2015-09-13 MED ORDER — ACETAMINOPHEN 325 MG PO TABS
ORAL_TABLET | ORAL | Status: AC
Start: 2015-09-13 — End: 2015-09-13
  Filled 2015-09-13: qty 2

## 2015-09-13 MED ORDER — INFLIXIMAB 100 MG IV SOLR
200.0000 mg | INTRAVENOUS | Status: DC
  Administered 2015-09-13: 200 mg via INTRAVENOUS
  Filled 2015-09-13: qty 20

## 2015-09-13 MED ORDER — SODIUM CHLORIDE 0.9 % IV SOLN
INTRAVENOUS | Status: DC
  Administered 2015-09-13: 09:00:00 via INTRAVENOUS

## 2015-09-13 MED ORDER — ACETAMINOPHEN 325 MG PO TABS
650.0000 mg | ORAL_TABLET | ORAL | Status: DC
  Administered 2015-09-13: 650 mg via ORAL

## 2015-09-13 MED ORDER — DIPHENHYDRAMINE HCL 25 MG PO CAPS
25.0000 mg | ORAL_CAPSULE | ORAL | Status: DC
  Administered 2015-09-13: 25 mg via ORAL

## 2015-09-13 MED ORDER — DIPHENHYDRAMINE HCL 25 MG PO CAPS
ORAL_CAPSULE | ORAL | Status: AC
  Filled 2015-09-13: qty 1

## 2015-09-14 DIAGNOSIS — M170 Bilateral primary osteoarthritis of knee: Secondary | ICD-10-CM | POA: Diagnosis not present

## 2015-09-18 ENCOUNTER — Other Ambulatory Visit: Payer: Self-pay | Admitting: Internal Medicine

## 2015-09-19 ENCOUNTER — Other Ambulatory Visit: Payer: Self-pay | Admitting: Emergency Medicine

## 2015-09-19 MED ORDER — BENAZEPRIL HCL 10 MG PO TABS: 10.0000 mg | ORAL_TABLET | Freq: Every day | ORAL | Status: DC

## 2015-09-27 ENCOUNTER — Encounter (HOSPITAL_COMMUNITY)
Admission: RE | Admit: 2015-09-27 | Discharge: 2015-09-27 | Disposition: A | Discharge status: Home / Self Care | Payer: Medicare Other | Source: Ambulatory Visit | Attending: Rheumatology | Admitting: Rheumatology

## 2015-09-27 DIAGNOSIS — Z5181 Encounter for therapeutic drug level monitoring: Secondary | ICD-10-CM | POA: Diagnosis not present

## 2015-09-27 DIAGNOSIS — M069 Rheumatoid arthritis, unspecified: Secondary | ICD-10-CM | POA: Diagnosis not present

## 2015-09-27 MED ORDER — ACETAMINOPHEN 325 MG PO TABS
650.0000 mg | ORAL_TABLET | ORAL | Status: DC
  Administered 2015-09-27: 650 mg via ORAL

## 2015-09-27 MED ORDER — SODIUM CHLORIDE 0.9 % IV SOLN
200.0000 mg | INTRAVENOUS | Status: DC
  Administered 2015-09-27: 200 mg via INTRAVENOUS
  Filled 2015-09-27: qty 20

## 2015-09-27 MED ORDER — DIPHENHYDRAMINE HCL 25 MG PO CAPS
ORAL_CAPSULE | ORAL | Status: AC
  Filled 2015-09-27: qty 1

## 2015-09-27 MED ORDER — ACETAMINOPHEN 325 MG PO TABS
ORAL_TABLET | ORAL | Status: AC
  Filled 2015-09-27: qty 2

## 2015-09-27 MED ORDER — DIPHENHYDRAMINE HCL 25 MG PO TABS
25.0000 mg | ORAL_TABLET | ORAL | Status: DC
  Administered 2015-09-27: 25 mg via ORAL
  Filled 2015-09-27: qty 1

## 2015-09-27 MED ORDER — SODIUM CHLORIDE 0.9 % IV SOLN
INTRAVENOUS | Status: DC
  Administered 2015-09-27: 10:00:00 via INTRAVENOUS

## 2015-09-28 DIAGNOSIS — M170 Bilateral primary osteoarthritis of knee: Secondary | ICD-10-CM | POA: Diagnosis not present

## 2015-09-29 ENCOUNTER — Other Ambulatory Visit: Payer: Self-pay | Admitting: Obstetrics and Gynecology

## 2015-09-29 NOTE — Telephone Encounter (Signed)
Medication refill request: Estradiol 0.05 mg patch Last AEX:  10/20/14 BS Next AEX: 12/12/16BS Last MMG (if hormonal medication request): 01/20/15 Refill authorized:10/20/14 3 refills, Please advise

## 2015-10-24 ENCOUNTER — Encounter: Payer: Self-pay | Admitting: Obstetrics and Gynecology

## 2015-10-24 ENCOUNTER — Ambulatory Visit: Payer: Medicare Other

## 2015-10-24 ENCOUNTER — Ambulatory Visit (INDEPENDENT_AMBULATORY_CARE_PROVIDER_SITE_OTHER): Payer: Medicare Other | Admitting: Obstetrics and Gynecology

## 2015-10-24 VITALS — BP 138/78 | HR 90 | Resp 16 | Ht 59.0 in | Wt 131.8 lb

## 2015-10-24 DIAGNOSIS — Z79818 Long term (current) use of other agents affecting estrogen receptors and estrogen levels: Secondary | ICD-10-CM

## 2015-10-24 DIAGNOSIS — Z01419 Encounter for gynecological examination (general) (routine) without abnormal findings: Secondary | ICD-10-CM | POA: Diagnosis not present

## 2015-10-24 DIAGNOSIS — Z124 Encounter for screening for malignant neoplasm of cervix: Secondary | ICD-10-CM

## 2015-10-24 DIAGNOSIS — Z79899 Other long term (current) drug therapy: Secondary | ICD-10-CM

## 2015-10-24 MED ORDER — ESTRADIOL 0.025 MG/24HR TD PTWK: 0.0250 mg | MEDICATED_PATCH | TRANSDERMAL | Status: DC

## 2015-10-24 NOTE — Patient Instructions (Signed)

## 2015-10-24 NOTE — Progress Notes (Signed)
Patient ID: Gloria Warren, female   DOB: 21-Mar-1931, 81 y.o.   MRN: [id number] 81 y.o. [id number] Married Caucasian female here for annual exam.    On ERT since the late 35s?  Having a lot of arthritis pain this year. Taking Remicade.  Now dealing with psoriasis.   PCP:   Unice Cobble, MD  No LMP recorded. Patient has had a hysterectomy.          Sexually active: Yes.   female The current method of family planning is status post hysterectomy.   Still has tubes and ovaries.   Exercising: No.  patient with rheumatoid arthritis. Smoker:  no  Health Maintenance: Pap:  02/2001 normal History of abnormal Pap:  no MMG:  01-21-15 Density Cat.B/Neg/BiRads1:[facility]. Colonoscopy:  2004 BMD:   11/2012   Result  Osteopenia. TDaP:  2015 Screening Labs:  Hb today: PCP, Urine today: PCP   reports that she has never smoked. She has never used smokeless tobacco. She reports that she drinks alcohol. She reports that she does not use illicit drugs.  Past Medical History  Diagnosis Date  . Hypertension   . Hyperlipidemia   . Rheumatoid arthritis(714.0)     Dr. Estanislado Pandy  . Allergic rhinitis   . History of skin cancer     squamous & basal cell ;Dr Denna Haggard  . Endometriosis   . Psoriasis   . UTI (lower urinary tract infection) 5/15    Proteus , R to Nitrofurantoin & Penicillin    Past Surgical History  Procedure Laterality Date  . Appendectomy      age 59  . Total abdominal hysterectomy      age 47 for endometriosis (no BSO)  . Tonsillectomy    . Colonoscopy      Neg 2; [place] , [place]  . G 2  p 2      Current Outpatient Prescriptions  Medication Sig Dispense Refill  . benazepril (LOTENSIN) 10 MG tablet Take 1 tablet (10 mg total) by mouth daily. 90 tablet 1  . Calcium Carbonate (CALTRATE 600 PO) Take 600 mg by mouth 2 (two) times daily.      . chlorhexidine (PERIDEX) 0.12 % solution 1 mL by Mouth Rinse route daily.    Marland Kitchen estradiol (CLIMARA - DOSED IN MG/24 HR) 0.05 mg/24hr  patch APPLY 1 PATCH ONTO THE SKIN ONCE WEEKLY 4 patch 0  . Fexofenadine HCl (ALLEGRA PO) Take 1 tablet by mouth at bedtime.     . fluocinonide ointment (LIDEX) 0.05 %     . furosemide (LASIX) 20 MG tablet Take 1 tablet (20 mg total) by mouth daily as needed (swelling). 30 tablet 5  . inFLIXimab (REMICADE) 100 MG injection Inject into the vein. Remicade Infusion every 4 weeks    . Multiple Vitamins-Minerals (VISION-VITE PRESERVE PO) Take by mouth.    . predniSONE (DELTASONE) 5 MG tablet      No current facility-administered medications for this visit.    Family History  Problem Relation Age of Onset  . Breast cancer Mother 51  . Hypertension Mother   . Gout Mother   . Diabetes Maternal Aunt   . Hypertension Father   . Heart attack Father 21  . Hypertension Son   . Stroke Maternal Grandmother     late 63s  . Stroke Sister     doing well    ROS:  Pertinent items are noted in HPI.  Otherwise, a comprehensive ROS was negative.  Exam:   BP  138/78 mmHg  Pulse 90  Resp 16  Ht 4\' 11"  (1.499 m)  Wt 131 lb 12.8 oz (59.784 kg)  BMI 26.61 kg/m2    General appearance: alert, cooperative and appears stated age Head: Normocephalic, without obvious abnormality, atraumatic Neck: no adenopathy, supple, symmetrical, trachea midline and thyroid normal to inspection and palpation Lungs: clear to auscultation bilaterally Breasts: normal appearance, no masses or tenderness, Inspection negative, No nipple retraction or dimpling, No nipple discharge or bleeding, No axillary or supraclavicular adenopathy Heart: regular rate and rhythm Abdomen: soft, non-tender; bowel sounds normal; no masses,  no organomegaly Extremities: extremities normal, atraumatic, no cyanosis or edema Skin: Skin color, texture, turgor normal. No rashes or lesions Lymph nodes: Cervical, supraclavicular, and axillary nodes normal. No abnormal inguinal nodes palpated Neurologic: Grossly normal  Pelvic: External genitalia:  no  lesions              Urethra:  normal appearing urethra with no masses, tenderness or lesions              Bartholins and Skenes: normal                 Vagina: normal appearing vagina with normal color and discharge, no lesions              Cervix: absent              Pap taken: No. Bimanual Exam:  Uterus:  uterus absent              Adnexa: no mass, fullness, tenderness              Rectovaginal: Yes.  .  Confirms.              Anus:  normal sphincter tone, no lesions  Chaperone was present for exam.  Assessment:   Well woman visit with normal exam. Status post TAH. Ovaries remain.  Rheumatoid arthritis.  Osteopenia.  Will have Dr. Corena Pilgrim manage. ERT patient.  FH breast cancer.   Plan: Yearly mammogram recommended after age 78.  Recommended self breast exam.  Pap and HR HPV as above. Discussed Calcium, Vitamin D, regular exercise program including cardiovascular and weight bearing exercise. Labs performed.  No..   See orders. Refills given on medications.  Yes.  .  See orders. Will reduce Estradiol transdermal patch to 0.025 mg once weekly for 3 months, and then stop.  I reviewed risk of potential DVT, PE, stroke, MI, and breast cancer.  Follow up annually and prn.      After visit summary provided.

## 2015-10-25 ENCOUNTER — Encounter (HOSPITAL_COMMUNITY)
Admission: RE | Admit: 2015-10-25 | Discharge: 2015-10-25 | Disposition: A | Discharge status: Home / Self Care | Payer: Medicare Other | Source: Ambulatory Visit | Attending: Rheumatology | Admitting: Rheumatology

## 2015-10-25 DIAGNOSIS — Z5181 Encounter for therapeutic drug level monitoring: Secondary | ICD-10-CM | POA: Insufficient documentation

## 2015-10-25 DIAGNOSIS — M069 Rheumatoid arthritis, unspecified: Secondary | ICD-10-CM | POA: Insufficient documentation

## 2015-10-25 LAB — CBC
HCT: 40.9 % (ref 36.0–46.0)
HEMOGLOBIN: 13 g/dL (ref 12.0–15.0)
MCH: 33.1 pg (ref 26.0–34.0)
MCHC: 31.8 g/dL (ref 30.0–36.0)
MCV: 104.1 fL — AB (ref 78.0–100.0)
Platelets: 288 10*3/uL (ref 150–400)
RBC: 3.93 MIL/uL (ref 3.87–5.11)
RDW: 12.7 % (ref 11.5–15.5)
WBC: 4.4 10*3/uL (ref 4.0–10.5)

## 2015-10-25 MED ORDER — DIPHENHYDRAMINE HCL 25 MG PO CAPS
ORAL_CAPSULE | ORAL | Status: AC
  Filled 2015-10-25: qty 1

## 2015-10-25 MED ORDER — ACETAMINOPHEN 325 MG PO TABS
ORAL_TABLET | ORAL | Status: AC
  Filled 2015-10-25: qty 2

## 2015-10-25 MED ORDER — DIPHENHYDRAMINE HCL 25 MG PO CAPS
25.0000 mg | ORAL_CAPSULE | ORAL | Status: DC
  Administered 2015-10-25: 25 mg via ORAL

## 2015-10-25 MED ORDER — ACETAMINOPHEN 325 MG PO TABS
650.0000 mg | ORAL_TABLET | ORAL | Status: DC
  Administered 2015-10-25: 650 mg via ORAL

## 2015-10-25 MED ORDER — SODIUM CHLORIDE 0.9 % IV SOLN
3.0000 mg/kg | INTRAVENOUS | Status: DC
  Administered 2015-10-25: 200 mg via INTRAVENOUS
  Filled 2015-10-25: qty 20

## 2015-10-25 MED ORDER — SODIUM CHLORIDE 0.9 % IV SOLN
INTRAVENOUS | Status: DC
  Administered 2015-10-25: 10:00:00 via INTRAVENOUS

## 2015-10-31 ENCOUNTER — Other Ambulatory Visit (HOSPITAL_COMMUNITY): Payer: Self-pay | Admitting: *Deleted

## 2015-11-22 ENCOUNTER — Encounter (HOSPITAL_COMMUNITY)
Admission: RE | Admit: 2015-11-22 | Discharge: 2015-11-22 | Disposition: A | Discharge status: Home / Self Care | Payer: Medicare Other | Source: Ambulatory Visit | Attending: Rheumatology | Admitting: Rheumatology

## 2015-11-22 DIAGNOSIS — L4050 Arthropathic psoriasis, unspecified: Secondary | ICD-10-CM | POA: Insufficient documentation

## 2015-11-22 DIAGNOSIS — M069 Rheumatoid arthritis, unspecified: Secondary | ICD-10-CM | POA: Diagnosis not present

## 2015-11-22 DIAGNOSIS — Z5181 Encounter for therapeutic drug level monitoring: Secondary | ICD-10-CM | POA: Insufficient documentation

## 2015-11-22 LAB — CBC WITH DIFFERENTIAL/PLATELET
BASOS ABS: 0 10*3/uL (ref 0.0–0.1)
BASOS PCT: 0 %
EOS PCT: 1 %
Eosinophils Absolute: 0.1 10*3/uL (ref 0.0–0.7)
HCT: 42.9 % (ref 36.0–46.0)
Hemoglobin: 13.8 g/dL (ref 12.0–15.0)
Lymphocytes Relative: 38 %
Lymphs Abs: 2 10*3/uL (ref 0.7–4.0)
MCH: 32.9 pg (ref 26.0–34.0)
MCHC: 32.2 g/dL (ref 30.0–36.0)
MCV: 102.4 fL — AB (ref 78.0–100.0)
MONO ABS: 0.7 10*3/uL (ref 0.1–1.0)
MONOS PCT: 14 %
Neutro Abs: 2.5 10*3/uL (ref 1.7–7.7)
Neutrophils Relative %: 47 %
PLATELETS: 291 10*3/uL (ref 150–400)
RBC: 4.19 MIL/uL (ref 3.87–5.11)
RDW: 12.9 % (ref 11.5–15.5)
WBC: 5.2 10*3/uL (ref 4.0–10.5)

## 2015-11-22 LAB — COMPREHENSIVE METABOLIC PANEL
ALBUMIN: 3.2 g/dL — AB (ref 3.5–5.0)
ALT: 17 U/L (ref 14–54)
ANION GAP: 7 (ref 5–15)
AST: 25 U/L (ref 15–41)
Alkaline Phosphatase: 54 U/L (ref 38–126)
BILIRUBIN TOTAL: 1 mg/dL (ref 0.3–1.2)
BUN: 26 mg/dL — AB (ref 6–20)
CHLORIDE: 107 mmol/L (ref 101–111)
CO2: 27 mmol/L (ref 22–32)
Calcium: 8.5 mg/dL — ABNORMAL LOW (ref 8.9–10.3)
Creatinine, Ser: 1.26 mg/dL — ABNORMAL HIGH (ref 0.44–1.00)
GFR calc Af Amer: 45 mL/min — ABNORMAL LOW (ref >60)
GFR, EST NON AFRICAN AMERICAN: 39 mL/min — AB (ref >60)
Glucose, Bld: 86 mg/dL (ref 65–99)
POTASSIUM: 4.2 mmol/L (ref 3.5–5.1)
Sodium: 141 mmol/L (ref 135–145)
TOTAL PROTEIN: 6.5 g/dL (ref 6.5–8.1)

## 2015-11-22 MED ORDER — SODIUM CHLORIDE 0.9 % IV SOLN
3.0000 mg/kg | Freq: Once | INTRAVENOUS | Status: AC
  Administered 2015-11-22: 200 mg via INTRAVENOUS
  Filled 2015-11-22: qty 20

## 2015-11-22 MED ORDER — DIPHENHYDRAMINE HCL 25 MG PO CAPS
ORAL_CAPSULE | ORAL | Status: AC
  Filled 2015-11-22: qty 1

## 2015-11-22 MED ORDER — ACETAMINOPHEN 325 MG PO TABS
650.0000 mg | ORAL_TABLET | Freq: Once | ORAL | Status: AC
  Administered 2015-11-22: 650 mg via ORAL

## 2015-11-22 MED ORDER — ACETAMINOPHEN 325 MG PO TABS
ORAL_TABLET | ORAL | Status: AC
  Filled 2015-11-22: qty 2

## 2015-11-22 MED ORDER — DIPHENHYDRAMINE HCL 25 MG PO CAPS
25.0000 mg | ORAL_CAPSULE | Freq: Once | ORAL | Status: AC
  Administered 2015-11-22: 25 mg via ORAL

## 2015-11-22 MED ORDER — SODIUM CHLORIDE 0.9 % IV SOLN
Freq: Once | INTRAVENOUS | Status: AC
  Administered 2015-11-22: 08:00:00 via INTRAVENOUS

## 2015-11-23 DIAGNOSIS — M0579 Rheumatoid arthritis with rheumatoid factor of multiple sites without organ or systems involvement: Secondary | ICD-10-CM | POA: Diagnosis not present

## 2015-11-23 DIAGNOSIS — Z79899 Other long term (current) drug therapy: Secondary | ICD-10-CM | POA: Diagnosis not present

## 2015-11-27 IMAGING — CT CT HEAD W/O CM
4 of 5 series · 17 of 30 positions shown, 19 images · non-contrast
Comparison: None.

CLINICAL DATA: Injury

EXAM:
CT HEAD WITHOUT CONTRAST
CT MAXILLOFACIAL WITHOUT CONTRAST
CT CERVICAL SPINE WITHOUT CONTRAST
TECHNIQUE: Multidetector CT imaging of the head, cervical spine, and
maxillofacial structures were performed using the standard protocol
without intravenous contrast. Multiplanar CT image reconstructions
of the cervical spine and maxillofacial structures were also
generated.

[Series 3: facial st · axial · 0.31mm/px · z∈[-222,-118]mm · 5 of 78 slices shown]
[im 13/78  brain]
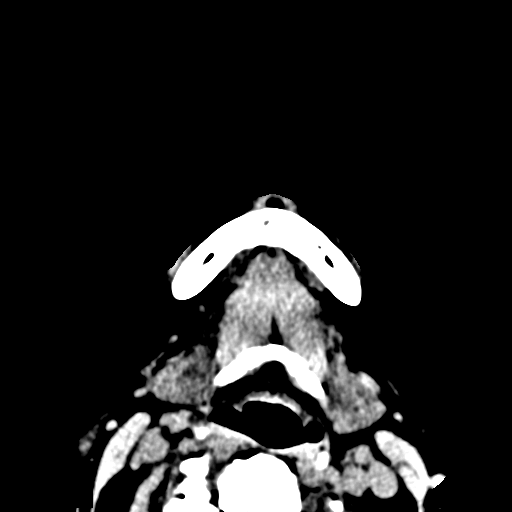
[im 26/78  brain]
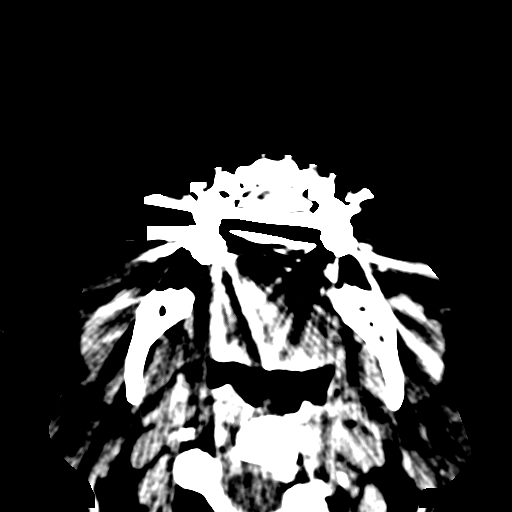
[im 39/78  brain]
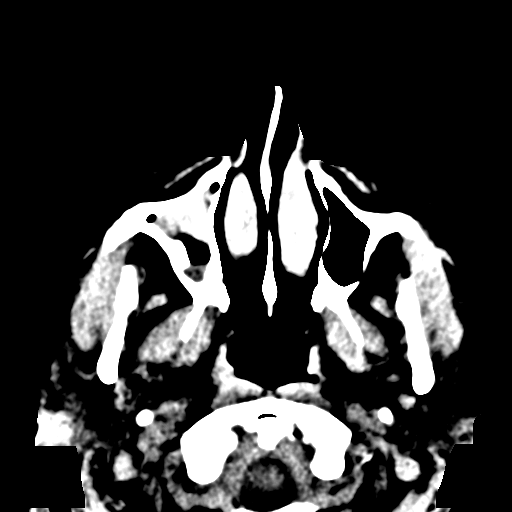
[im 52/78  brain]
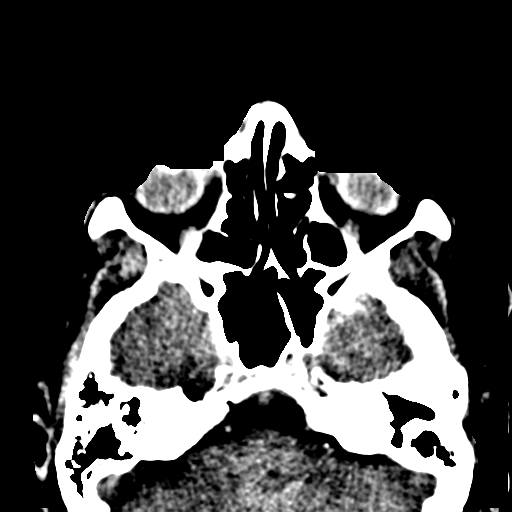
[im 65/78  brain]
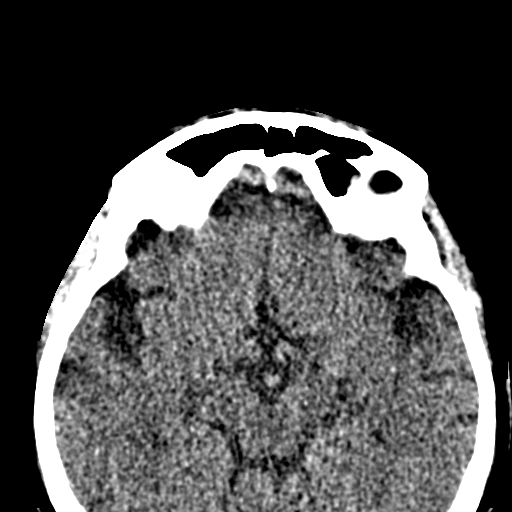

[Series 7: c-spine st · axial · 0.23mm/px · z∈[-280,-212]mm · 4 of 81 slices shown]
[im 12/81  brain]
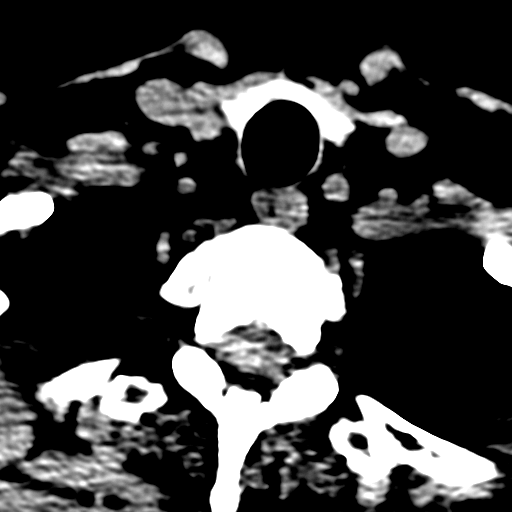
[im 23/81  brain]
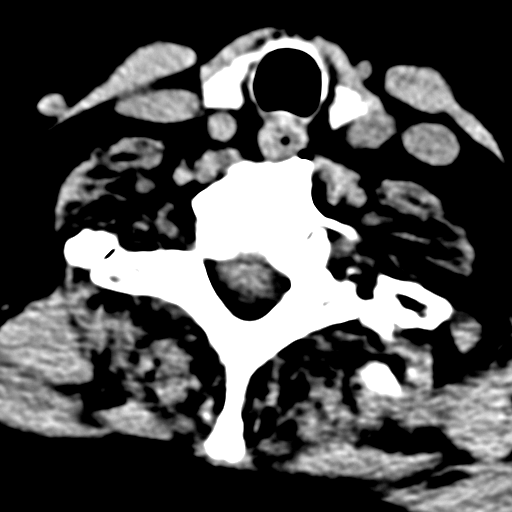
[im 35/81  brain]
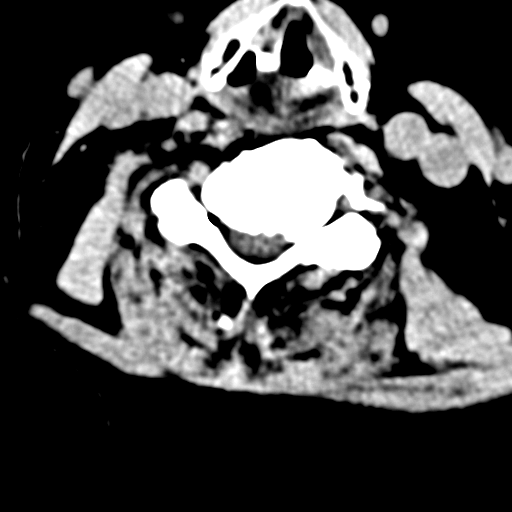
[im 46/81  brain]
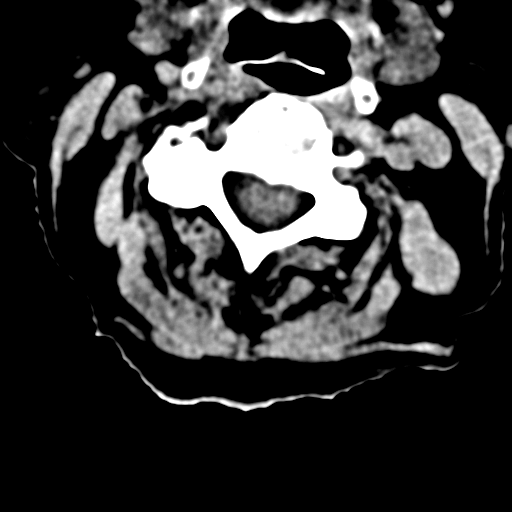

[Series 11: bone windows · axial · 0.43mm/px · z∈[-114,-72]mm · 2 of 44 slices shown]
[im 15/44  bone]
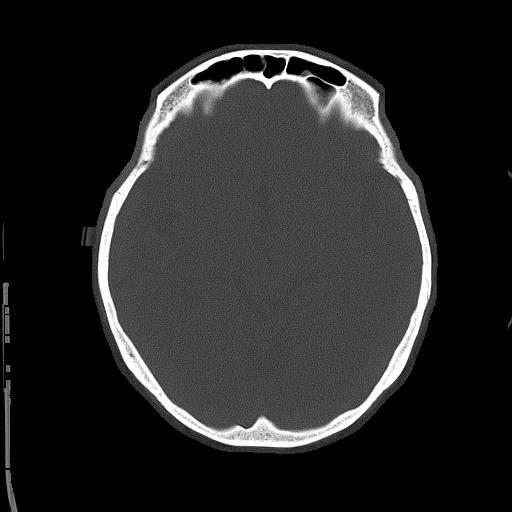
[im 29/44  bone]
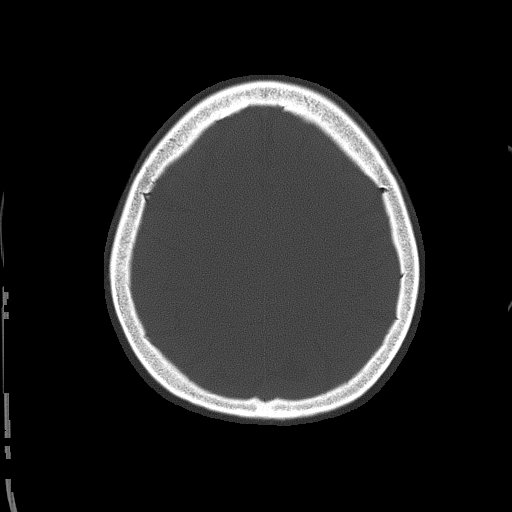

[Series 18: axial · axial · 0.23mm/px · z∈[-319,-217]mm · 6 of 88 slices shown, 8 images]
[im 13/88  brain]
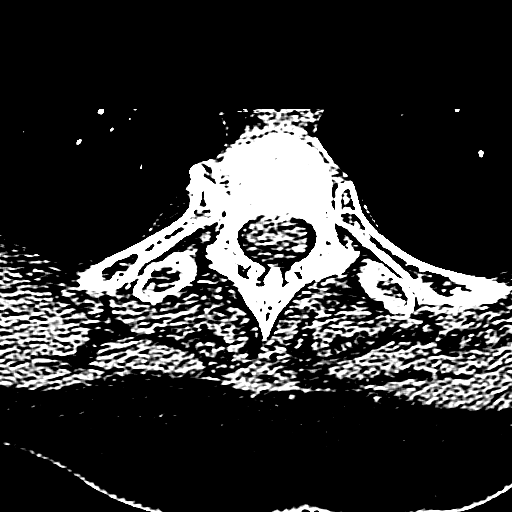
[im 13/88  bone]
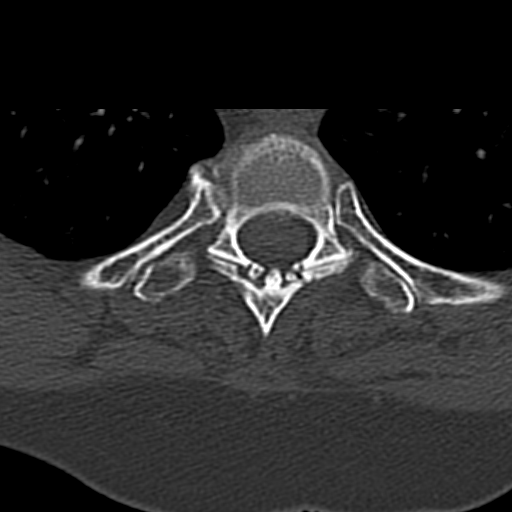
[im 25/88  brain]
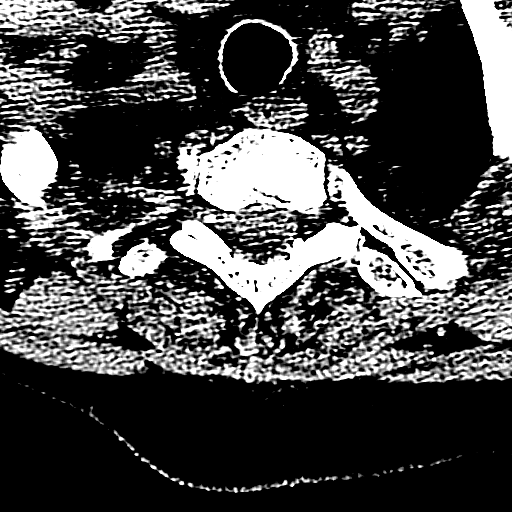
[im 38/88  brain]
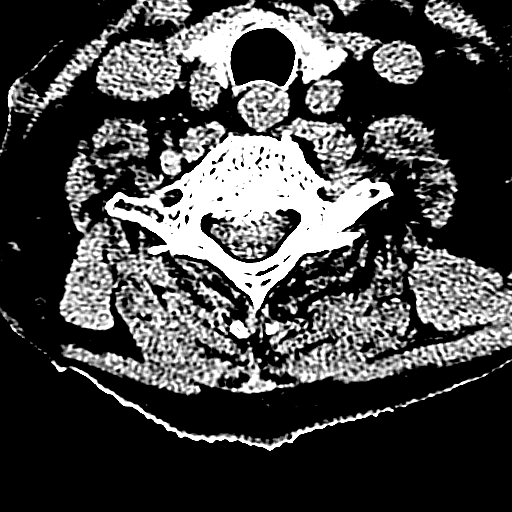
[im 50/88  brain]
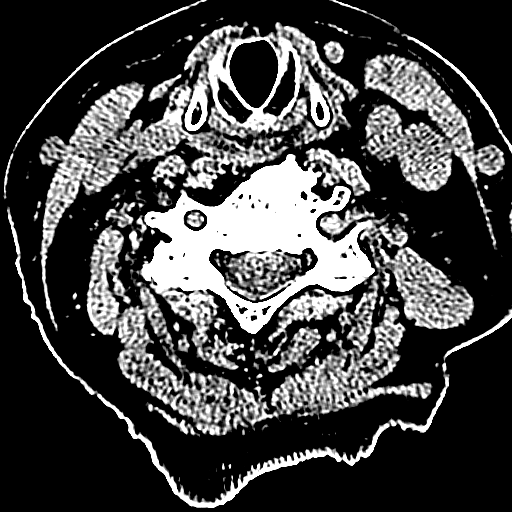
[im 63/88  brain]
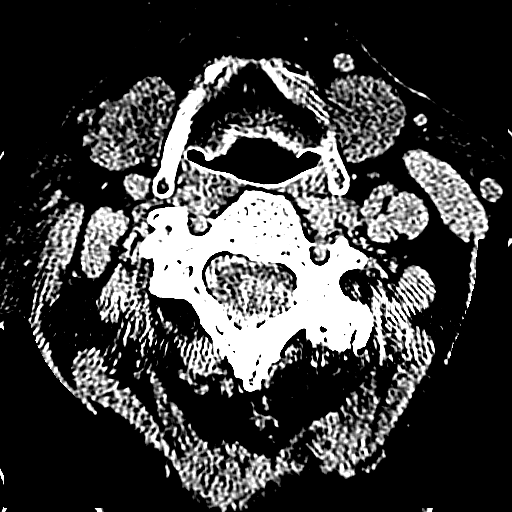
[im 63/88  bone]
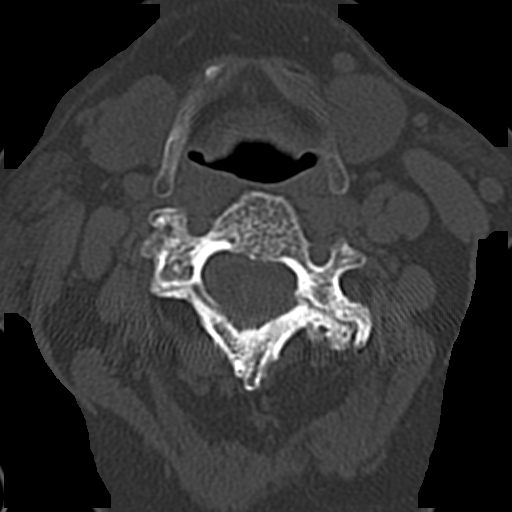
[im 75/88  brain]
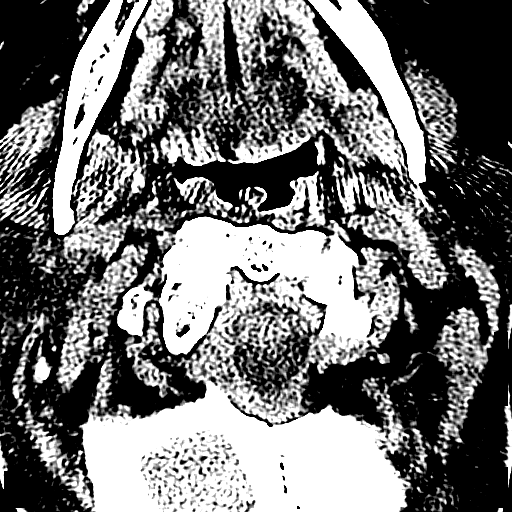

[17 of 30 positions shown; findings below may reference images not displayed]

FINDINGS: CT HEAD FINDINGS

No mass effect or hemorrhage. Mild global atrophy appropriate to
age. New ventricular system is unremarkable. No skull fracture.
Fluid in the right maxillary sinus.

CT MAXILLOFACIAL FINDINGS

There is a minimally displaced fracture involving the floor of the
right orbit. The orbital rim is intact. Fracture is slightly
comminuted. No evidence of ocular muscle entrapment. A small amount
of orbital fat has herniated through the defect in the orbit floor.
There is sub air blood level in the right maxillary sinus. No
evidence of vitreous hemorrhage. Minimal soft tissue swelling over
the right orbit. Medial right orbital wall is intact. Left orbit is
intact. Mandible is intact.

CT CERVICAL SPINE FINDINGS

No fracture. No dislocation. No obvious soft tissue injury. No
evidence of spinal hemorrhage. Thyroid is atrophic.

Advanced left facet arthropathy at C2-3.

Minimal anterolisthesis C4 upon C5 is degenerative an associated
with right facet arthropathy and mild right foraminal narrowing.

Slight retrolisthesis and posterior osteophytic ridging at C5-6
results in spinal stenosis. Chronic and degenerative cord
compression is not excluded.

Disc narrowing and posterior disc osteophytes at C6-7 are less
severe.
IMPRESSION: No acute intracranial or brain pathology.

Acute right orbit floor fracture.

No cervical spine injury.  Advanced degenerative changes noted.

## 2015-11-28 ENCOUNTER — Encounter: Payer: Self-pay | Admitting: Internal Medicine

## 2015-11-28 ENCOUNTER — Ambulatory Visit (INDEPENDENT_AMBULATORY_CARE_PROVIDER_SITE_OTHER): Payer: Medicare Other | Admitting: Internal Medicine

## 2015-11-28 VITALS — BP 132/78 | HR 94 | Temp 97.8°F | Resp 16 | Wt 131.0 lb

## 2015-11-28 DIAGNOSIS — I10 Essential (primary) hypertension: Secondary | ICD-10-CM | POA: Diagnosis not present

## 2015-11-28 DIAGNOSIS — M05741 Rheumatoid arthritis with rheumatoid factor of right hand without organ or systems involvement: Secondary | ICD-10-CM | POA: Diagnosis not present

## 2015-11-28 DIAGNOSIS — M05742 Rheumatoid arthritis with rheumatoid factor of left hand without organ or systems involvement: Secondary | ICD-10-CM

## 2015-11-28 DIAGNOSIS — K219 Gastro-esophageal reflux disease without esophagitis: Secondary | ICD-10-CM | POA: Diagnosis not present

## 2015-11-28 DIAGNOSIS — R600 Localized edema: Secondary | ICD-10-CM

## 2015-11-28 MED ORDER — RANITIDINE HCL 150 MG PO TABS: 150.0000 mg | ORAL_TABLET | Freq: Every day | ORAL | Status: DC

## 2015-11-28 MED ORDER — FUROSEMIDE 20 MG PO TABS: 20.0000 mg | ORAL_TABLET | Freq: Every day | ORAL | Status: DC

## 2015-11-28 NOTE — Assessment & Plan Note (Signed)
BP Readings from Last 3 Encounters:  11/28/15 132/78  11/22/15 118/41  10/25/15 122/56   Well controlled Continue current meds

## 2015-11-28 NOTE — Assessment & Plan Note (Addendum)
She feels her edema is related to circulation Takes lasix 1-2 times a week Weighs herself daily - stable  Given severity - we will increase lasix to daily Recheck cmp in 1 week Continue to elevate legs, low sodium diet Consider compression stockings Deferred vascular referral today - will consider again in the future

## 2015-11-28 NOTE — Progress Notes (Signed)
Pre visit review using our clinic review tool, if applicable. No additional management support is needed unless otherwise documented below in the visit note. 

## 2015-11-28 NOTE — Assessment & Plan Note (Signed)
Following with rheum On remicade and prednisone 5 mg daily

## 2015-11-28 NOTE — Patient Instructions (Addendum)
  We have reviewed your prior records including labs and tests today.  Test(s) ordered today. Your results will be released to [place] (or called to you) after review, usually within 72hours after test completion. If any changes need to be made, you will be notified at that same time.  Medications reviewed and updated.  Changes include starting the lasix on a daily  basis.   We will also start zantac at night for the heartburn.  That prescription was sent to your pharmacy.  Please call with any questions or concerns.   Please schedule followup in 6 months

## 2015-11-28 NOTE — Assessment & Plan Note (Signed)
Having frequent GERD and on daily prednisone Start zantac 150mg  at night

## 2015-11-28 NOTE — Progress Notes (Signed)
Subjective:    Patient ID: Gloria Warren, female    DOB: 11-Aug-1931, 82 y.o.   MRN: [id number]  HPI She is here to establish with a new pcp. He is here for routine follow-up and has no specific concerns.  Rheumatoid arthritis:  She is following with rhreumatology.  She is on remicade.  She has been on several medications in the past.  Her day to day pain varies from a 6/10 - 10+/10.  He is currently on Remicade and also daily low-dose prednisone. She feels she tolerates the pain fairly well and denies depression.  Hypertension: She is taking her medication daily. She is compliant with a low sodium diet.  She denies chest pain, palpitations, shortness of breath and regular headaches. She is not exercising regularly.    Leg edema:  She has a history of leg edema that has gotten worse recently. She takes lasix 1-2 times a week.  She elevates her legs when she sits and is compliant with a low sodium diet.  She thinks the swelling is from poor circulation, but does not know.  She denies sob, chest pain and history of heart problems.   GERD:  She has been experiencing more GERD.  She has not had that in the past.  She does not take any medication for it.    Medications and allergies reviewed with patient and updated if appropriate.  Patient Active Problem List   Diagnosis Date Noted  . Dysuria 03/25/2014  . Psoriasis 03/11/2013  . SKIN CANCER, HX OF 06/26/2010  . ALLERGIC RHINITIS 06/23/2009  . Hyperglycemia 06/23/2009  . Rheumatoid arthritis ([place]) 02/25/2008  . Hyperlipidemia 11/17/2007  . Essential hypertension 11/17/2007    Current Outpatient Prescriptions on File Prior to Visit  Medication Sig Dispense Refill  . benazepril (LOTENSIN) 10 MG tablet Take 1 tablet (10 mg total) by mouth daily. 90 tablet 1  . Calcium Carbonate (CALTRATE 600 PO) Take 600 mg by mouth 2 (two) times daily.      . chlorhexidine (PERIDEX) 0.12 % solution 1 mL by Mouth Rinse route daily.    Marland Kitchen estradiol  (CLIMARA - DOSED IN MG/24 HR) 0.025 mg/24hr patch Place 1 patch (0.025 mg total) onto the skin once a week. 12 patch 0  . Fexofenadine HCl (ALLEGRA PO) Take 1 tablet by mouth at bedtime.     . fluocinonide ointment (LIDEX) 0.05 %     . furosemide (LASIX) 20 MG tablet Take 1 tablet (20 mg total) by mouth daily as needed (swelling). 30 tablet 5  . inFLIXimab (REMICADE) 100 MG injection Inject into the vein. Remicade Infusion every 6 weeks    . Multiple Vitamins-Minerals (VISION-VITE PRESERVE PO) Take by mouth.    . predniSONE (DELTASONE) 5 MG tablet      No current facility-administered medications on file prior to visit.    Past Medical History  Diagnosis Date  . Hypertension   . Hyperlipidemia   . Rheumatoid arthritis(714.0)     Dr. Estanislado Pandy  . Allergic rhinitis   . History of skin cancer     squamous & basal cell ;Dr Denna Haggard  . Endometriosis   . Psoriasis   . UTI (lower urinary tract infection) 5/15    Proteus , R to Nitrofurantoin & Penicillin    Past Surgical History  Procedure Laterality Date  . Appendectomy      age 22  . Total abdominal hysterectomy      age 45 for endometriosis (no BSO)  .  Tonsillectomy    . Colonoscopy      Neg 2; [place] , [place]  . G 2  p 2      Social History   Social History  . Marital Status: Married    Spouse Name: N/A  . Number of Children: N/A  . Years of Education: N/A   Social History Main Topics  . Smoking status: Never Smoker   . Smokeless tobacco: Never Used  . Alcohol Use: 0.0 oz/week    0 Standard drinks or equivalent per week     Comment:  rarely  . Drug Use: No  . Sexual Activity:    Partners: Male    Birth Control/ Protection: Surgical     Comment: TAH   Other Topics Concern  . Not on file   Social History Narrative    Family History  Problem Relation Age of Onset  . Breast cancer Mother 79  . Hypertension Mother   . Gout Mother   . Diabetes Maternal Aunt   . Hypertension Father   . Heart attack Father 100   . Hypertension Son   . Stroke Maternal Grandmother     late 82s  . Stroke Sister     doing well    Review of Systems  Constitutional: Negative for fever and chills.  Respiratory: Positive for cough (residual from recent URI). Negative for shortness of breath and wheezing.   Cardiovascular: Positive for leg swelling. Negative for chest pain and palpitations.  Gastrointestinal: Positive for nausea (in morning). Negative for abdominal pain, diarrhea, constipation and blood in stool.       GERD - more frequently  Musculoskeletal: Positive for back pain and arthralgias.  Neurological: Negative for dizziness, weakness, light-headedness, numbness and headaches.       Objective:   Filed Vitals:   11/28/15 1055  BP: 132/78  Pulse: 94  Temp: 97.8 F (36.6 C)  Resp: 16   Filed Weights   11/28/15 1055  Weight: 131 lb (59.421 kg)   Body mass index is 26.44 kg/(m^2).   Physical Exam Constitutional: Appears well-developed and well-nourished. No distress.  Neck: Neck supple. No tracheal deviation present. No thyromegaly present.  No carotid bruit. No cervical adenopathy.   Cardiovascular: Normal rate, regular rhythm and normal heart sounds.   No murmur heard.  2+ pitting edema bilaterally Pulmonary/Chest: Effort normal and breath sounds normal. No respiratory distress. No wheezes.  Skin: no breaks in skin on legs         Assessment & Plan:   See Problem List for Assessment and Plan of chronic medical problems.  Follow up in 6 months

## 2015-12-06 ENCOUNTER — Other Ambulatory Visit (INDEPENDENT_AMBULATORY_CARE_PROVIDER_SITE_OTHER): Payer: Medicare Other

## 2015-12-06 DIAGNOSIS — R600 Localized edema: Secondary | ICD-10-CM | POA: Diagnosis not present

## 2015-12-06 LAB — COMPREHENSIVE METABOLIC PANEL
ALT: 12 U/L (ref 0–35)
AST: 15 U/L (ref 0–37)
Albumin: 3.5 g/dL (ref 3.5–5.2)
Alkaline Phosphatase: 56 U/L (ref 39–117)
BUN: 27 mg/dL — ABNORMAL HIGH (ref 6–23)
CHLORIDE: 103 meq/L (ref 96–112)
CO2: 28 meq/L (ref 19–32)
Calcium: 9.2 mg/dL (ref 8.4–10.5)
Creatinine, Ser: 0.92 mg/dL (ref 0.40–1.20)
GFR: 62.03 mL/min (ref >60.00)
GLUCOSE: 90 mg/dL (ref 70–99)
POTASSIUM: 4 meq/L (ref 3.5–5.1)
Sodium: 138 mEq/L (ref 135–145)
Total Bilirubin: 0.6 mg/dL (ref 0.2–1.2)
Total Protein: 7 g/dL (ref 6.0–8.3)

## 2015-12-07 ENCOUNTER — Encounter: Payer: Self-pay | Admitting: Internal Medicine

## 2015-12-19 DIAGNOSIS — H353112 Nonexudative age-related macular degeneration, right eye, intermediate dry stage: Secondary | ICD-10-CM | POA: Diagnosis not present

## 2015-12-19 DIAGNOSIS — H353121 Nonexudative age-related macular degeneration, left eye, early dry stage: Secondary | ICD-10-CM | POA: Diagnosis not present

## 2015-12-19 DIAGNOSIS — H04123 Dry eye syndrome of bilateral lacrimal glands: Secondary | ICD-10-CM | POA: Diagnosis not present

## 2015-12-19 DIAGNOSIS — H26493 Other secondary cataract, bilateral: Secondary | ICD-10-CM | POA: Diagnosis not present

## 2015-12-19 DIAGNOSIS — H35372 Puckering of macula, left eye: Secondary | ICD-10-CM | POA: Diagnosis not present

## 2015-12-19 DIAGNOSIS — M0609 Rheumatoid arthritis without rheumatoid factor, multiple sites: Secondary | ICD-10-CM | POA: Diagnosis not present

## 2015-12-22 DIAGNOSIS — M0579 Rheumatoid arthritis with rheumatoid factor of multiple sites without organ or systems involvement: Secondary | ICD-10-CM | POA: Diagnosis not present

## 2015-12-22 DIAGNOSIS — Z5181 Encounter for therapeutic drug level monitoring: Secondary | ICD-10-CM | POA: Diagnosis not present

## 2015-12-22 DIAGNOSIS — M170 Bilateral primary osteoarthritis of knee: Secondary | ICD-10-CM | POA: Diagnosis not present

## 2015-12-22 DIAGNOSIS — L400 Psoriasis vulgaris: Secondary | ICD-10-CM | POA: Diagnosis not present

## 2015-12-22 DIAGNOSIS — Z79899 Other long term (current) drug therapy: Secondary | ICD-10-CM | POA: Diagnosis not present

## 2016-01-02 ENCOUNTER — Other Ambulatory Visit (HOSPITAL_COMMUNITY): Payer: Self-pay | Admitting: *Deleted

## 2016-01-03 ENCOUNTER — Encounter (HOSPITAL_COMMUNITY)
Admission: RE | Admit: 2016-01-03 | Discharge: 2016-01-03 | Disposition: A | Discharge status: Home / Self Care | Payer: Medicare Other | Source: Ambulatory Visit | Attending: Rheumatology | Admitting: Rheumatology

## 2016-01-03 DIAGNOSIS — Z5181 Encounter for therapeutic drug level monitoring: Secondary | ICD-10-CM | POA: Diagnosis not present

## 2016-01-03 DIAGNOSIS — L4050 Arthropathic psoriasis, unspecified: Secondary | ICD-10-CM | POA: Diagnosis not present

## 2016-01-03 DIAGNOSIS — M069 Rheumatoid arthritis, unspecified: Secondary | ICD-10-CM | POA: Diagnosis not present

## 2016-01-03 LAB — COMPREHENSIVE METABOLIC PANEL
ALK PHOS: 48 U/L (ref 38–126)
ALT: 12 U/L — AB (ref 14–54)
AST: 17 U/L (ref 15–41)
Albumin: 3.1 g/dL — ABNORMAL LOW (ref 3.5–5.0)
Anion gap: 9 (ref 5–15)
BILIRUBIN TOTAL: 0.6 mg/dL (ref 0.3–1.2)
BUN: 24 mg/dL — AB (ref 6–20)
CALCIUM: 9.5 mg/dL (ref 8.9–10.3)
CO2: 26 mmol/L (ref 22–32)
CREATININE: 1.07 mg/dL — AB (ref 0.44–1.00)
Chloride: 104 mmol/L (ref 101–111)
GFR calc Af Amer: 54 mL/min — ABNORMAL LOW (ref >60)
GFR, EST NON AFRICAN AMERICAN: 47 mL/min — AB (ref >60)
Glucose, Bld: 108 mg/dL — ABNORMAL HIGH (ref 65–99)
Potassium: 4.1 mmol/L (ref 3.5–5.1)
Sodium: 139 mmol/L (ref 135–145)
TOTAL PROTEIN: 6.7 g/dL (ref 6.5–8.1)

## 2016-01-03 LAB — CBC WITH DIFFERENTIAL/PLATELET
BASOS ABS: 0 10*3/uL (ref 0.0–0.1)
Basophils Relative: 0 %
Eosinophils Absolute: 0 10*3/uL (ref 0.0–0.7)
Eosinophils Relative: 0 %
HEMATOCRIT: 41.2 % (ref 36.0–46.0)
Hemoglobin: 13 g/dL (ref 12.0–15.0)
LYMPHS ABS: 0.5 10*3/uL — AB (ref 0.7–4.0)
LYMPHS PCT: 6 %
MCH: 31 pg (ref 26.0–34.0)
MCHC: 31.6 g/dL (ref 30.0–36.0)
MCV: 98.1 fL (ref 78.0–100.0)
MONO ABS: 0.6 10*3/uL (ref 0.1–1.0)
Monocytes Relative: 8 %
NEUTROS ABS: 6.6 10*3/uL (ref 1.7–7.7)
Neutrophils Relative %: 86 %
Platelets: 289 10*3/uL (ref 150–400)
RBC: 4.2 MIL/uL (ref 3.87–5.11)
RDW: 13.2 % (ref 11.5–15.5)
WBC: 7.7 10*3/uL (ref 4.0–10.5)

## 2016-01-03 MED ORDER — DIPHENHYDRAMINE HCL 25 MG PO CAPS
25.0000 mg | ORAL_CAPSULE | ORAL | Status: DC
  Administered 2016-01-03: 25 mg via ORAL

## 2016-01-03 MED ORDER — SODIUM CHLORIDE 0.9 % IV SOLN
5.0000 mg/kg | INTRAVENOUS | Status: DC
  Administered 2016-01-03: 300 mg via INTRAVENOUS
  Filled 2016-01-03: qty 30

## 2016-01-03 MED ORDER — ACETAMINOPHEN 325 MG PO TABS
ORAL_TABLET | ORAL | Status: AC
  Filled 2016-01-03: qty 2

## 2016-01-03 MED ORDER — DIPHENHYDRAMINE HCL 25 MG PO CAPS
ORAL_CAPSULE | ORAL | Status: AC
  Filled 2016-01-03: qty 1

## 2016-01-03 MED ORDER — ACETAMINOPHEN 325 MG PO TABS
650.0000 mg | ORAL_TABLET | ORAL | Status: DC
  Administered 2016-01-03: 650 mg via ORAL

## 2016-01-03 MED ORDER — SODIUM CHLORIDE 0.9 % IV SOLN
INTRAVENOUS | Status: DC
  Administered 2016-01-03: 10:00:00 via INTRAVENOUS

## 2016-01-12 ENCOUNTER — Other Ambulatory Visit: Payer: Self-pay

## 2016-01-12 DIAGNOSIS — Z1231 Encounter for screening mammogram for malignant neoplasm of breast: Secondary | ICD-10-CM

## 2016-01-25 ENCOUNTER — Other Ambulatory Visit: Payer: Self-pay | Admitting: Obstetrics and Gynecology

## 2016-01-25 NOTE — Telephone Encounter (Signed)
Medication refill request: Estradiol 0.025 mg Last AEX:  10/24/15 with BS Next AEX: 10/24/16 with BS Last MMG (if hormonal medication request): 01/31/2016 BI-RADS 1: Negative scheduled for 01/31/2016 with [facility]  Refill authorized: Please advise  According to last AEX Dr. Elza Rafter recommendation's was for patient to reduce patch to 0.025 mg and then planned to stop, please advise.

## 2016-01-25 NOTE — Telephone Encounter (Signed)
I am declining refill on patient's Estradiol patch.  We had discussed stopping her estrogen.  [place]

## 2016-01-31 ENCOUNTER — Ambulatory Visit
Admission: RE | Admit: 2016-01-31 | Discharge: 2016-01-31 | Disposition: A | Discharge status: Home / Self Care | Payer: Medicare Other | Source: Ambulatory Visit

## 2016-01-31 DIAGNOSIS — Z1231 Encounter for screening mammogram for malignant neoplasm of breast: Secondary | ICD-10-CM | POA: Diagnosis not present

## 2016-02-13 ENCOUNTER — Other Ambulatory Visit (HOSPITAL_COMMUNITY): Payer: Self-pay | Admitting: *Deleted

## 2016-02-14 ENCOUNTER — Ambulatory Visit (HOSPITAL_COMMUNITY)
Admission: RE | Admit: 2016-02-14 | Discharge: 2016-02-14 | Disposition: A | Discharge status: Home / Self Care | Payer: Medicare Other | Source: Ambulatory Visit | Attending: Rheumatology | Admitting: Rheumatology

## 2016-02-14 DIAGNOSIS — L4050 Arthropathic psoriasis, unspecified: Secondary | ICD-10-CM | POA: Diagnosis not present

## 2016-02-14 DIAGNOSIS — M069 Rheumatoid arthritis, unspecified: Secondary | ICD-10-CM | POA: Diagnosis not present

## 2016-02-14 LAB — CBC WITH DIFFERENTIAL/PLATELET
BASOS ABS: 0 10*3/uL (ref 0.0–0.1)
BASOS PCT: 0 %
Eosinophils Absolute: 0.1 10*3/uL (ref 0.0–0.7)
Eosinophils Relative: 1 %
HEMATOCRIT: 43 % (ref 36.0–46.0)
HEMOGLOBIN: 13.3 g/dL (ref 12.0–15.0)
LYMPHS ABS: 1.4 10*3/uL (ref 0.7–4.0)
Lymphocytes Relative: 15 %
MCH: 29.4 pg (ref 26.0–34.0)
MCHC: 30.9 g/dL (ref 30.0–36.0)
MCV: 94.9 fL (ref 78.0–100.0)
MONOS PCT: 8 %
Monocytes Absolute: 0.8 10*3/uL (ref 0.1–1.0)
NEUTROS PCT: 76 %
Neutro Abs: 7 10*3/uL (ref 1.7–7.7)
Platelets: 326 10*3/uL (ref 150–400)
RBC: 4.53 MIL/uL (ref 3.87–5.11)
RDW: 14.5 % (ref 11.5–15.5)
WBC: 9.2 10*3/uL (ref 4.0–10.5)

## 2016-02-14 LAB — COMPREHENSIVE METABOLIC PANEL
ALBUMIN: 3.2 g/dL — AB (ref 3.5–5.0)
ALK PHOS: 55 U/L (ref 38–126)
ALT: 17 U/L (ref 14–54)
AST: 21 U/L (ref 15–41)
Anion gap: 12 (ref 5–15)
BILIRUBIN TOTAL: 0.6 mg/dL (ref 0.3–1.2)
BUN: 28 mg/dL — AB (ref 6–20)
CALCIUM: 9.8 mg/dL (ref 8.9–10.3)
CO2: 25 mmol/L (ref 22–32)
CREATININE: 1.11 mg/dL — AB (ref 0.44–1.00)
Chloride: 103 mmol/L (ref 101–111)
GFR calc Af Amer: 52 mL/min — ABNORMAL LOW (ref >60)
GFR calc non Af Amer: 45 mL/min — ABNORMAL LOW (ref >60)
GLUCOSE: 97 mg/dL (ref 65–99)
Potassium: 4.3 mmol/L (ref 3.5–5.1)
Sodium: 140 mmol/L (ref 135–145)
TOTAL PROTEIN: 7.4 g/dL (ref 6.5–8.1)

## 2016-02-14 MED ORDER — ACETAMINOPHEN 325 MG PO TABS
650.0000 mg | ORAL_TABLET | ORAL | Status: DC
  Administered 2016-02-14: 650 mg via ORAL

## 2016-02-14 MED ORDER — ACETAMINOPHEN 325 MG PO TABS
ORAL_TABLET | ORAL | Status: AC
  Filled 2016-02-14: qty 2

## 2016-02-14 MED ORDER — SODIUM CHLORIDE 0.9 % IV SOLN
INTRAVENOUS | Status: DC
  Administered 2016-02-14: 10:00:00 via INTRAVENOUS

## 2016-02-14 MED ORDER — DIPHENHYDRAMINE HCL 25 MG PO CAPS
25.0000 mg | ORAL_CAPSULE | ORAL | Status: DC
  Administered 2016-02-14: 25 mg via ORAL

## 2016-02-14 MED ORDER — SODIUM CHLORIDE 0.9 % IV SOLN
5.0000 mg/kg | INTRAVENOUS | Status: DC
  Administered 2016-02-14: 300 mg via INTRAVENOUS
  Filled 2016-02-14: qty 30

## 2016-02-14 MED ORDER — DIPHENHYDRAMINE HCL 25 MG PO CAPS
ORAL_CAPSULE | ORAL | Status: AC
  Filled 2016-02-14: qty 1

## 2016-02-17 LAB — QUANTIFERON IN TUBE
QFT TB AG MINUS NIL VALUE: 0.01 [IU]/mL
QUANTIFERON MITOGEN VALUE: 0.24 [IU]/mL
QUANTIFERON TB AG VALUE: 0.12 [IU]/mL
QUANTIFERON TB GOLD: UNDETERMINED
Quantiferon Nil Value: 0.11 IU/mL

## 2016-02-17 LAB — QUANTIFERON TB GOLD ASSAY (BLOOD)

## 2016-03-14 DIAGNOSIS — M170 Bilateral primary osteoarthritis of knee: Secondary | ICD-10-CM | POA: Diagnosis not present

## 2016-03-14 DIAGNOSIS — M79642 Pain in left hand: Secondary | ICD-10-CM | POA: Diagnosis not present

## 2016-03-14 DIAGNOSIS — M79672 Pain in left foot: Secondary | ICD-10-CM | POA: Diagnosis not present

## 2016-03-14 DIAGNOSIS — M79641 Pain in right hand: Secondary | ICD-10-CM | POA: Diagnosis not present

## 2016-03-14 DIAGNOSIS — M79671 Pain in right foot: Secondary | ICD-10-CM | POA: Diagnosis not present

## 2016-03-18 ENCOUNTER — Other Ambulatory Visit: Payer: Self-pay | Admitting: Internal Medicine

## 2016-03-22 DIAGNOSIS — M170 Bilateral primary osteoarthritis of knee: Secondary | ICD-10-CM | POA: Diagnosis not present

## 2016-03-26 ENCOUNTER — Other Ambulatory Visit (HOSPITAL_COMMUNITY): Payer: Self-pay | Admitting: *Deleted

## 2016-03-27 ENCOUNTER — Ambulatory Visit (HOSPITAL_COMMUNITY)
Admission: RE | Admit: 2016-03-27 | Discharge: 2016-03-27 | Disposition: A | Discharge status: Home / Self Care | Payer: Medicare Other | Source: Ambulatory Visit | Attending: Rheumatology | Admitting: Rheumatology

## 2016-03-27 DIAGNOSIS — L4050 Arthropathic psoriasis, unspecified: Secondary | ICD-10-CM | POA: Insufficient documentation

## 2016-03-27 DIAGNOSIS — M069 Rheumatoid arthritis, unspecified: Secondary | ICD-10-CM | POA: Diagnosis not present

## 2016-03-27 LAB — COMPREHENSIVE METABOLIC PANEL
ALK PHOS: 52 U/L (ref 38–126)
ALT: 14 U/L (ref 14–54)
ANION GAP: 13 (ref 5–15)
AST: 18 U/L (ref 15–41)
Albumin: 2.9 g/dL — ABNORMAL LOW (ref 3.5–5.0)
BILIRUBIN TOTAL: 0.8 mg/dL (ref 0.3–1.2)
BUN: 27 mg/dL — AB (ref 6–20)
CHLORIDE: 101 mmol/L (ref 101–111)
CO2: 25 mmol/L (ref 22–32)
Calcium: 9.8 mg/dL (ref 8.9–10.3)
Creatinine, Ser: 1.2 mg/dL — ABNORMAL HIGH (ref 0.44–1.00)
GFR calc Af Amer: 47 mL/min — ABNORMAL LOW (ref >60)
GFR, EST NON AFRICAN AMERICAN: 41 mL/min — AB (ref >60)
Glucose, Bld: 106 mg/dL — ABNORMAL HIGH (ref 65–99)
POTASSIUM: 4.5 mmol/L (ref 3.5–5.1)
Sodium: 139 mmol/L (ref 135–145)
TOTAL PROTEIN: 6.9 g/dL (ref 6.5–8.1)

## 2016-03-27 LAB — CBC WITH DIFFERENTIAL/PLATELET
BASOS ABS: 0 10*3/uL (ref 0.0–0.1)
Basophils Relative: 0 %
Eosinophils Absolute: 0 10*3/uL (ref 0.0–0.7)
Eosinophils Relative: 0 %
HEMATOCRIT: 40.2 % (ref 36.0–46.0)
Hemoglobin: 13 g/dL (ref 12.0–15.0)
Lymphocytes Relative: 7 %
Lymphs Abs: 0.8 10*3/uL (ref 0.7–4.0)
MCH: 29.8 pg (ref 26.0–34.0)
MCHC: 32.3 g/dL (ref 30.0–36.0)
MCV: 92.2 fL (ref 78.0–100.0)
MONO ABS: 0.8 10*3/uL (ref 0.1–1.0)
MONOS PCT: 7 %
NEUTROS ABS: 10.2 10*3/uL — AB (ref 1.7–7.7)
Neutrophils Relative %: 86 %
Platelets: 373 10*3/uL (ref 150–400)
RBC: 4.36 MIL/uL (ref 3.87–5.11)
RDW: 15.4 % (ref 11.5–15.5)
WBC: 11.8 10*3/uL — ABNORMAL HIGH (ref 4.0–10.5)

## 2016-03-27 MED ORDER — ACETAMINOPHEN 325 MG PO TABS
650.0000 mg | ORAL_TABLET | ORAL | Status: DC
  Administered 2016-03-27: 650 mg via ORAL

## 2016-03-27 MED ORDER — ACETAMINOPHEN 325 MG PO TABS
ORAL_TABLET | ORAL | Status: AC
  Filled 2016-03-27: qty 2

## 2016-03-27 MED ORDER — SODIUM CHLORIDE 0.9 % IV SOLN
INTRAVENOUS | Status: DC
  Administered 2016-03-27: 10:00:00 via INTRAVENOUS

## 2016-03-27 MED ORDER — SODIUM CHLORIDE 0.9 % IV SOLN
5.0000 mg/kg | INTRAVENOUS | Status: DC
  Administered 2016-03-27: 300 mg via INTRAVENOUS
  Filled 2016-03-27: qty 30

## 2016-03-27 MED ORDER — DIPHENHYDRAMINE HCL 25 MG PO TABS
25.0000 mg | ORAL_TABLET | ORAL | Status: DC
  Administered 2016-03-27: 25 mg via ORAL
  Filled 2016-03-27: qty 1

## 2016-03-27 MED ORDER — DIPHENHYDRAMINE HCL 25 MG PO CAPS
ORAL_CAPSULE | ORAL | Status: AC
  Filled 2016-03-27: qty 1

## 2016-03-29 DIAGNOSIS — M170 Bilateral primary osteoarthritis of knee: Secondary | ICD-10-CM | POA: Diagnosis not present

## 2016-04-05 DIAGNOSIS — H353131 Nonexudative age-related macular degeneration, bilateral, early dry stage: Secondary | ICD-10-CM | POA: Diagnosis not present

## 2016-04-05 DIAGNOSIS — H26493 Other secondary cataract, bilateral: Secondary | ICD-10-CM | POA: Diagnosis not present

## 2016-04-24 ENCOUNTER — Other Ambulatory Visit: Payer: Self-pay | Admitting: *Deleted

## 2016-04-24 MED ORDER — FUROSEMIDE 20 MG PO TABS: 20.0000 mg | ORAL_TABLET | Freq: Every day | ORAL | Status: DC

## 2016-04-24 NOTE — Telephone Encounter (Signed)
Receive call from husband stating pharmacy sent request to have lasix refill & they have not receive anything back from md. Verified pharmacy inform will send to [facility]...Johny Chess

## 2016-05-07 ENCOUNTER — Other Ambulatory Visit (HOSPITAL_COMMUNITY): Payer: Self-pay | Admitting: *Deleted

## 2016-05-08 ENCOUNTER — Ambulatory Visit (HOSPITAL_COMMUNITY)
Admission: RE | Admit: 2016-05-08 | Discharge: 2016-05-08 | Disposition: A | Discharge status: Home / Self Care | Payer: Medicare Other | Source: Ambulatory Visit | Attending: Rheumatology | Admitting: Rheumatology

## 2016-05-08 DIAGNOSIS — L4050 Arthropathic psoriasis, unspecified: Secondary | ICD-10-CM | POA: Diagnosis not present

## 2016-05-08 DIAGNOSIS — M069 Rheumatoid arthritis, unspecified: Secondary | ICD-10-CM | POA: Diagnosis not present

## 2016-05-08 LAB — COMPREHENSIVE METABOLIC PANEL
ALK PHOS: 55 U/L (ref 38–126)
ALT: 15 U/L (ref 14–54)
ANION GAP: 4 — AB (ref 5–15)
AST: 16 U/L (ref 15–41)
Albumin: 2.8 g/dL — ABNORMAL LOW (ref 3.5–5.0)
BILIRUBIN TOTAL: 0.6 mg/dL (ref 0.3–1.2)
BUN: 32 mg/dL — ABNORMAL HIGH (ref 6–20)
CALCIUM: 8.9 mg/dL (ref 8.9–10.3)
CO2: 27 mmol/L (ref 22–32)
Chloride: 108 mmol/L (ref 101–111)
Creatinine, Ser: 1 mg/dL (ref 0.44–1.00)
GFR, EST AFRICAN AMERICAN: 59 mL/min — AB (ref >60)
GFR, EST NON AFRICAN AMERICAN: 51 mL/min — AB (ref >60)
GLUCOSE: 119 mg/dL — AB (ref 65–99)
POTASSIUM: 3.8 mmol/L (ref 3.5–5.1)
Sodium: 139 mmol/L (ref 135–145)
TOTAL PROTEIN: 6.4 g/dL — AB (ref 6.5–8.1)

## 2016-05-08 LAB — CBC
HEMATOCRIT: 38.5 % (ref 36.0–46.0)
HEMOGLOBIN: 11.8 g/dL — AB (ref 12.0–15.0)
MCH: 28.2 pg (ref 26.0–34.0)
MCHC: 30.6 g/dL (ref 30.0–36.0)
MCV: 92.1 fL (ref 78.0–100.0)
Platelets: 322 10*3/uL (ref 150–400)
RBC: 4.18 MIL/uL (ref 3.87–5.11)
RDW: 16.4 % — AB (ref 11.5–15.5)
WBC: 9.4 10*3/uL (ref 4.0–10.5)

## 2016-05-08 LAB — DIFFERENTIAL
BASOS ABS: 0 10*3/uL (ref 0.0–0.1)
BASOS PCT: 0 %
Eosinophils Absolute: 0.1 10*3/uL (ref 0.0–0.7)
Eosinophils Relative: 1 %
LYMPHS PCT: 11 %
Lymphs Abs: 1 10*3/uL (ref 0.7–4.0)
MONO ABS: 0.7 10*3/uL (ref 0.1–1.0)
Monocytes Relative: 8 %
NEUTROS ABS: 7.6 10*3/uL (ref 1.7–7.7)
NEUTROS PCT: 80 %

## 2016-05-08 MED ORDER — ACETAMINOPHEN 325 MG PO TABS
ORAL_TABLET | ORAL | Status: AC
  Administered 2016-05-08: 650 mg
  Filled 2016-05-08: qty 2

## 2016-05-08 MED ORDER — SODIUM CHLORIDE 0.9 % IV SOLN
INTRAVENOUS | Status: DC
  Administered 2016-05-08: 10:00:00 via INTRAVENOUS

## 2016-05-08 MED ORDER — ACETAMINOPHEN 325 MG PO TABS: 650.0000 mg | ORAL_TABLET | ORAL | Status: DC

## 2016-05-08 MED ORDER — DIPHENHYDRAMINE HCL 25 MG PO CAPS: 25.0000 mg | ORAL_CAPSULE | ORAL | Status: DC

## 2016-05-08 MED ORDER — SODIUM CHLORIDE 0.9 % IV SOLN
5.0000 mg/kg | INTRAVENOUS | Status: DC
  Administered 2016-05-08: 300 mg via INTRAVENOUS
  Filled 2016-05-08: qty 30

## 2016-05-08 MED ORDER — DIPHENHYDRAMINE HCL 25 MG PO CAPS
ORAL_CAPSULE | ORAL | Status: AC
  Administered 2016-05-08: 25 mg
  Filled 2016-05-08: qty 1

## 2016-05-27 NOTE — Patient Instructions (Addendum)
   Medications reviewed and updated.  No changes recommended at this time.  A referral has been ordered for Dr Amil Amen.   Please followup in 6 month

## 2016-05-27 NOTE — Progress Notes (Signed)
Subjective:    Patient ID: Gloria Warren, female    DOB: 02/25/1931, 82 y.o.   MRN: [id number]  HPI She is here for follow up.  Hypertension: She is taking her medication daily. She is compliant with a low sodium diet.  She denies chest pain, palpitations, edema, shortness of breath and regular headaches. She is not exercising regularly.  She does not monitor her blood pressure at home.    GERD:  She is taking her medication daily as prescribed.  She denies any GERD symptoms and feels her GERD is well controlled.   RA: She is on remicade and low dose prednisone.  She has not tolerated her last RA medication - it stopped working.  She tried a different one and it caused severe psoriasis.      Hyperglycemia: she is taking prednisone 10 mg daily.  This was increased from 5 mg in the past couple of months.    Leg edema:  She takes the lasix every other day. It has gotten slightly worse recently.  She elevates her legs and is compliant with a low sodium diet.  She feels elevating her legs helps and is able to control it without increasing the lasix.      Medications and allergies reviewed with patient and updated if appropriate.  Patient Active Problem List   Diagnosis Date Noted  . Leg edema 11/28/2015  . GERD (gastroesophageal reflux disease) 11/28/2015  . Dysuria 03/25/2014  . Psoriasis 03/11/2013  . SKIN CANCER, HX OF 06/26/2010  . ALLERGIC RHINITIS 06/23/2009  . Hyperglycemia 06/23/2009  . Rheumatoid arthritis ([place]) 02/25/2008  . Hyperlipidemia 11/17/2007  . Essential hypertension 11/17/2007    Current Outpatient Prescriptions on File Prior to Visit  Medication Sig Dispense Refill  . benazepril (LOTENSIN) 10 MG tablet TAKE 1 TABLET(10 MG) BY MOUTH DAILY 90 tablet 1  . Calcium Carbonate (CALTRATE 600 PO) Take 600 mg by mouth 2 (two) times daily.      . chlorhexidine (PERIDEX) 0.12 % solution 1 mL by Mouth Rinse route daily.    Marland Kitchen Fexofenadine HCl (ALLEGRA PO) Take 1  tablet by mouth at bedtime.     . fluocinonide ointment (LIDEX) 0.05 %     . furosemide (LASIX) 20 MG tablet Take 1 tablet (20 mg total) by mouth daily. 90 tablet 1  . inFLIXimab (REMICADE) 100 MG injection Inject into the vein. Remicade Infusion every 6 weeks    . Multiple Vitamins-Minerals (VISION-VITE PRESERVE PO) Take by mouth.    . ranitidine (ZANTAC) 150 MG tablet Take 1 tablet (150 mg total) by mouth at bedtime. 90 tablet 3   No current facility-administered medications on file prior to visit.    Past Medical History  Diagnosis Date  . Hypertension   . Hyperlipidemia   . Rheumatoid arthritis(714.0)     Dr. Estanislado Pandy  . Allergic rhinitis   . History of skin cancer     squamous & basal cell ;Dr Denna Haggard  . Endometriosis   . Psoriasis   . UTI (lower urinary tract infection) 5/15    Proteus , R to Nitrofurantoin & Penicillin    Past Surgical History  Procedure Laterality Date  . Appendectomy      age 32  . Total abdominal hysterectomy      age 38 for endometriosis (no BSO)  . Tonsillectomy    . Colonoscopy      Neg 2;  , [place]  . G 2  p 2  Social History   Social History  . Marital Status: Married    Spouse Name: N/A  . Number of Children: N/A  . Years of Education: N/A   Social History Main Topics  . Smoking status: Never Smoker   . Smokeless tobacco: Never Used  . Alcohol Use: 0.0 oz/week    0 Standard drinks or equivalent per week     Comment:  rarely  . Drug Use: No  . Sexual Activity:    Partners: Male    Birth Control/ Protection: Surgical     Comment: TAH   Other Topics Concern  . None   Social History Narrative    Family History  Problem Relation Age of Onset  . Breast cancer Mother 7  . Hypertension Mother   . Gout Mother   . Diabetes Maternal Aunt   . Hypertension Father   . Heart attack Father 77  . Hypertension Son   . Stroke Maternal Grandmother     late 63s  . Stroke Sister     doing well    Review of Systems    Constitutional: Positive for fever (occasional, low grade at night from RA).  Respiratory: Negative for shortness of breath and wheezing.   Cardiovascular: Positive for leg swelling (chronic, takes lasix). Negative for chest pain and palpitations.  Musculoskeletal: Positive for neck pain (from RA).  Neurological: Positive for headaches. Negative for dizziness and light-headedness.       Objective:   Filed Vitals:   05/28/16 1055  BP: 126/68  Pulse: 96  Temp: 98 F (36.7 C)  Resp: 20   Filed Weights   05/28/16 1055  Weight: 127 lb (57.607 kg)   Body mass index is 24.01 kg/(m^2).   Physical Exam Constitutional: Appears well-developed and well-nourished. No distress.  Neck: Neck supple. No tracheal deviation present. No thyromegaly present.  No carotid bruit. No cervical adenopathy.   Cardiovascular: Normal rate, regular rhythm and normal heart sounds.   No murmur heard.  1+ nonpitting edema LE b/l Pulmonary/Chest: Effort normal and breath sounds normal. No respiratory distress. No wheezes.       Assessment & Plan:   See Problem List for Assessment and Plan of chronic medical problems.  F/u in 6 months

## 2016-05-28 ENCOUNTER — Encounter: Payer: Self-pay | Admitting: Internal Medicine

## 2016-05-28 ENCOUNTER — Ambulatory Visit (INDEPENDENT_AMBULATORY_CARE_PROVIDER_SITE_OTHER): Payer: Medicare Other | Admitting: Internal Medicine

## 2016-05-28 VITALS — BP 126/68 | HR 96 | Temp 98.0°F | Resp 20 | Wt 127.0 lb

## 2016-05-28 DIAGNOSIS — I10 Essential (primary) hypertension: Secondary | ICD-10-CM | POA: Diagnosis not present

## 2016-05-28 DIAGNOSIS — K219 Gastro-esophageal reflux disease without esophagitis: Secondary | ICD-10-CM

## 2016-05-28 DIAGNOSIS — M05742 Rheumatoid arthritis with rheumatoid factor of left hand without organ or systems involvement: Secondary | ICD-10-CM

## 2016-05-28 DIAGNOSIS — L409 Psoriasis, unspecified: Secondary | ICD-10-CM

## 2016-05-28 DIAGNOSIS — M05741 Rheumatoid arthritis with rheumatoid factor of right hand without organ or systems involvement: Secondary | ICD-10-CM

## 2016-05-28 DIAGNOSIS — R739 Hyperglycemia, unspecified: Secondary | ICD-10-CM | POA: Diagnosis not present

## 2016-05-28 DIAGNOSIS — R600 Localized edema: Secondary | ICD-10-CM

## 2016-05-28 NOTE — Assessment & Plan Note (Signed)
Severe flare recently related to RA medication On remicade

## 2016-05-28 NOTE — Assessment & Plan Note (Addendum)
Taking lasix every other day Edema worse recently - likely from increased steroids Compliant with a low sodium diet Elevates legs daily Overall she feels it is controlled Continue above

## 2016-05-28 NOTE — Assessment & Plan Note (Signed)
On prednisone chronically - dose increased to 10 mg daily Will check a1c

## 2016-05-28 NOTE — Progress Notes (Signed)
Pre visit review using our clinic review tool, if applicable. No additional management support is needed unless otherwise documented below in the visit note. 

## 2016-05-28 NOTE — Assessment & Plan Note (Signed)
BP well controlled Current regimen effective and well tolerated Continue current medications at current doses  

## 2016-05-28 NOTE — Assessment & Plan Note (Signed)
Following with Dr Estanislado Pandy - having difficulty finding a medication to put her on Will go to [place] in October for second opinion, but would like an opinion prior to that if possible - would like to see Dr Amil Amen to see if he has any other suggestions Referral ordered

## 2016-05-28 NOTE — Assessment & Plan Note (Signed)
On long term prednisone GERD controlled Continue daily medication

## 2016-06-05 ENCOUNTER — Encounter (HOSPITAL_COMMUNITY): Payer: Medicare Other

## 2016-06-11 ENCOUNTER — Other Ambulatory Visit (HOSPITAL_COMMUNITY): Payer: Self-pay | Admitting: *Deleted

## 2016-06-12 ENCOUNTER — Ambulatory Visit (HOSPITAL_COMMUNITY)
Admission: RE | Admit: 2016-06-12 | Discharge: 2016-06-12 | Disposition: A | Discharge status: Home / Self Care | Payer: Medicare Other | Source: Ambulatory Visit | Attending: Rheumatology | Admitting: Rheumatology

## 2016-06-12 DIAGNOSIS — M0579 Rheumatoid arthritis with rheumatoid factor of multiple sites without organ or systems involvement: Secondary | ICD-10-CM | POA: Insufficient documentation

## 2016-06-12 DIAGNOSIS — L4050 Arthropathic psoriasis, unspecified: Secondary | ICD-10-CM | POA: Diagnosis not present

## 2016-06-12 LAB — COMPREHENSIVE METABOLIC PANEL
ALBUMIN: 3.3 g/dL — AB (ref 3.5–5.0)
ALT: 19 U/L (ref 14–54)
ANION GAP: 11 (ref 5–15)
AST: 22 U/L (ref 15–41)
Alkaline Phosphatase: 57 U/L (ref 38–126)
BILIRUBIN TOTAL: 0.5 mg/dL (ref 0.3–1.2)
BUN: 28 mg/dL — AB (ref 6–20)
CHLORIDE: 102 mmol/L (ref 101–111)
CO2: 26 mmol/L (ref 22–32)
Calcium: 9.8 mg/dL (ref 8.9–10.3)
Creatinine, Ser: 1.12 mg/dL — ABNORMAL HIGH (ref 0.44–1.00)
GFR calc Af Amer: 51 mL/min — ABNORMAL LOW (ref >60)
GFR calc non Af Amer: 44 mL/min — ABNORMAL LOW (ref >60)
GLUCOSE: 100 mg/dL — AB (ref 65–99)
POTASSIUM: 3.9 mmol/L (ref 3.5–5.1)
SODIUM: 139 mmol/L (ref 135–145)
Total Protein: 7.2 g/dL (ref 6.5–8.1)

## 2016-06-12 LAB — CBC WITH DIFFERENTIAL/PLATELET
BASOS ABS: 0 10*3/uL (ref 0.0–0.1)
BASOS PCT: 0 %
EOS ABS: 0.1 10*3/uL (ref 0.0–0.7)
Eosinophils Relative: 1 %
HEMATOCRIT: 43.6 % (ref 36.0–46.0)
Hemoglobin: 13.2 g/dL (ref 12.0–15.0)
Lymphocytes Relative: 13 %
Lymphs Abs: 1.8 10*3/uL (ref 0.7–4.0)
MCH: 29.1 pg (ref 26.0–34.0)
MCHC: 30.3 g/dL (ref 30.0–36.0)
MCV: 96 fL (ref 78.0–100.0)
Monocytes Absolute: 1 10*3/uL (ref 0.1–1.0)
Monocytes Relative: 7 %
NEUTROS ABS: 11 10*3/uL — AB (ref 1.7–7.7)
Neutrophils Relative %: 79 %
PLATELETS: 380 10*3/uL (ref 150–400)
RBC: 4.54 MIL/uL (ref 3.87–5.11)
RDW: 16.6 % — AB (ref 11.5–15.5)
WBC: 13.9 10*3/uL — ABNORMAL HIGH (ref 4.0–10.5)

## 2016-06-12 MED ORDER — DIPHENHYDRAMINE HCL 25 MG PO CAPS
ORAL_CAPSULE | ORAL | Status: AC
  Administered 2016-06-12: 25 mg via ORAL
  Filled 2016-06-12: qty 1

## 2016-06-12 MED ORDER — DIPHENHYDRAMINE HCL 25 MG PO TABS
25.0000 mg | ORAL_TABLET | Freq: Once | ORAL | Status: AC
  Administered 2016-06-12: 25 mg via ORAL
  Filled 2016-06-12: qty 1

## 2016-06-12 MED ORDER — INFLIXIMAB 100 MG IV SOLR
5.0000 mg/kg | INTRAVENOUS | Status: DC
  Administered 2016-06-12: 300 mg via INTRAVENOUS
  Filled 2016-06-12: qty 30

## 2016-06-12 MED ORDER — SODIUM CHLORIDE 0.9 % IV SOLN
250.0000 mg | Freq: Once | INTRAVENOUS | Status: AC
  Administered 2016-06-12: 250 mg via INTRAVENOUS
  Filled 2016-06-12: qty 2

## 2016-06-12 MED ORDER — ACETAMINOPHEN 325 MG PO TABS: 650.0000 mg | ORAL_TABLET | Freq: Once | ORAL | Status: DC

## 2016-06-12 MED ORDER — SODIUM CHLORIDE 0.9 % IV SOLN: INTRAVENOUS | Status: DC

## 2016-06-12 NOTE — Progress Notes (Signed)
Gave solumedrol per MD order.  Had patient wait 30 minutes after solumedrol given before DC pt home.  30 minutes after solumedrol given patient stated she was not itching anymore and her hives were gone from her arms and hands.  VSS upon DC home.

## 2016-06-18 ENCOUNTER — Other Ambulatory Visit (INDEPENDENT_AMBULATORY_CARE_PROVIDER_SITE_OTHER): Payer: Medicare Other

## 2016-06-18 ENCOUNTER — Telehealth: Payer: Self-pay | Admitting: Emergency Medicine

## 2016-06-18 DIAGNOSIS — M0579 Rheumatoid arthritis with rheumatoid factor of multiple sites without organ or systems involvement: Secondary | ICD-10-CM | POA: Diagnosis not present

## 2016-06-18 DIAGNOSIS — R739 Hyperglycemia, unspecified: Secondary | ICD-10-CM

## 2016-06-18 DIAGNOSIS — M1711 Unilateral primary osteoarthritis, right knee: Secondary | ICD-10-CM | POA: Diagnosis not present

## 2016-06-18 DIAGNOSIS — M069 Rheumatoid arthritis, unspecified: Secondary | ICD-10-CM

## 2016-06-18 DIAGNOSIS — N189 Chronic kidney disease, unspecified: Secondary | ICD-10-CM | POA: Diagnosis not present

## 2016-06-18 DIAGNOSIS — Z79899 Other long term (current) drug therapy: Secondary | ICD-10-CM | POA: Diagnosis not present

## 2016-06-18 DIAGNOSIS — Z7952 Long term (current) use of systemic steroids: Secondary | ICD-10-CM

## 2016-06-18 LAB — HEMOGLOBIN A1C: Hgb A1c MFr Bld: 5.6 % (ref 4.6–6.5)

## 2016-06-18 NOTE — Telephone Encounter (Signed)
Pt came to lab to have Hgb a1c checked due to steroid use.

## 2016-06-19 ENCOUNTER — Encounter: Payer: Self-pay | Admitting: Internal Medicine

## 2016-07-13 ENCOUNTER — Other Ambulatory Visit (HOSPITAL_COMMUNITY): Payer: Self-pay | Admitting: *Deleted

## 2016-07-16 DIAGNOSIS — M0579 Rheumatoid arthritis with rheumatoid factor of multiple sites without organ or systems involvement: Secondary | ICD-10-CM

## 2016-07-17 ENCOUNTER — Ambulatory Visit (HOSPITAL_COMMUNITY)
Admission: RE | Admit: 2016-07-17 | Discharge: 2016-07-17 | Disposition: A | Discharge status: Home / Self Care | Payer: Medicare Other | Source: Ambulatory Visit | Attending: Rheumatology | Admitting: Rheumatology

## 2016-07-17 ENCOUNTER — Encounter (HOSPITAL_COMMUNITY): Payer: Medicare Other

## 2016-07-17 DIAGNOSIS — L4050 Arthropathic psoriasis, unspecified: Secondary | ICD-10-CM | POA: Diagnosis not present

## 2016-07-17 DIAGNOSIS — M0579 Rheumatoid arthritis with rheumatoid factor of multiple sites without organ or systems involvement: Secondary | ICD-10-CM | POA: Diagnosis not present

## 2016-07-17 LAB — COMPREHENSIVE METABOLIC PANEL
ALBUMIN: 3 g/dL — AB (ref 3.5–5.0)
ALK PHOS: 55 U/L (ref 38–126)
ALT: 12 U/L — AB (ref 14–54)
ANION GAP: 8 (ref 5–15)
AST: 20 U/L (ref 15–41)
BILIRUBIN TOTAL: 0.8 mg/dL (ref 0.3–1.2)
BUN: 28 mg/dL — ABNORMAL HIGH (ref 6–20)
CALCIUM: 9.7 mg/dL (ref 8.9–10.3)
CO2: 25 mmol/L (ref 22–32)
CREATININE: 1.16 mg/dL — AB (ref 0.44–1.00)
Chloride: 103 mmol/L (ref 101–111)
GFR calc Af Amer: 49 mL/min — ABNORMAL LOW (ref >60)
GFR calc non Af Amer: 42 mL/min — ABNORMAL LOW (ref >60)
GLUCOSE: 109 mg/dL — AB (ref 65–99)
Potassium: 4.5 mmol/L (ref 3.5–5.1)
Sodium: 136 mmol/L (ref 135–145)
TOTAL PROTEIN: 7.1 g/dL (ref 6.5–8.1)

## 2016-07-17 LAB — CBC WITH DIFFERENTIAL/PLATELET
BASOS PCT: 0 %
Basophils Absolute: 0 10*3/uL (ref 0.0–0.1)
Eosinophils Absolute: 0.1 10*3/uL (ref 0.0–0.7)
Eosinophils Relative: 1 %
HEMATOCRIT: 40.8 % (ref 36.0–46.0)
HEMOGLOBIN: 12.7 g/dL (ref 12.0–15.0)
LYMPHS ABS: 1.1 10*3/uL (ref 0.7–4.0)
Lymphocytes Relative: 9 %
MCH: 29.7 pg (ref 26.0–34.0)
MCHC: 31.1 g/dL (ref 30.0–36.0)
MCV: 95.6 fL (ref 78.0–100.0)
MONOS PCT: 7 %
Monocytes Absolute: 0.9 10*3/uL (ref 0.1–1.0)
NEUTROS ABS: 9.8 10*3/uL — AB (ref 1.7–7.7)
NEUTROS PCT: 83 %
Platelets: 375 10*3/uL (ref 150–400)
RBC: 4.27 MIL/uL (ref 3.87–5.11)
RDW: 16.2 % — ABNORMAL HIGH (ref 11.5–15.5)
WBC: 11.8 10*3/uL — AB (ref 4.0–10.5)

## 2016-07-17 MED ORDER — ACETAMINOPHEN 325 MG PO TABS: 650.0000 mg | ORAL_TABLET | ORAL | Status: DC

## 2016-07-17 MED ORDER — METHYLPREDNISOLONE SODIUM SUCC 40 MG IJ SOLR
INTRAMUSCULAR | Status: AC
  Administered 2016-07-17: 40 mg
  Filled 2016-07-17: qty 1

## 2016-07-17 MED ORDER — DIPHENHYDRAMINE HCL 25 MG PO CAPS
ORAL_CAPSULE | ORAL | Status: AC
  Filled 2016-07-17: qty 1

## 2016-07-17 MED ORDER — METHYLPREDNISOLONE SODIUM SUCC 40 MG IJ SOLR
INTRAMUSCULAR | Status: AC
  Administered 2016-07-17: 40 mg via INTRAVENOUS
  Filled 2016-07-17: qty 1

## 2016-07-17 MED ORDER — SODIUM CHLORIDE 0.9 % IV SOLN
5.0000 mg/kg | INTRAVENOUS | Status: DC
  Administered 2016-07-17: 300 mg via INTRAVENOUS
  Filled 2016-07-17: qty 30

## 2016-07-17 MED ORDER — METHYLPREDNISOLONE SODIUM SUCC 40 MG IJ SOLR
40.0000 mg | INTRAMUSCULAR | Status: DC
  Administered 2016-07-17: 40 mg via INTRAVENOUS

## 2016-07-17 MED ORDER — DIPHENHYDRAMINE HCL 25 MG PO CAPS
25.0000 mg | ORAL_CAPSULE | ORAL | Status: DC
  Administered 2016-07-17: 25 mg via ORAL

## 2016-07-23 DIAGNOSIS — M25562 Pain in left knee: Secondary | ICD-10-CM | POA: Diagnosis not present

## 2016-07-23 DIAGNOSIS — M25561 Pain in right knee: Secondary | ICD-10-CM | POA: Diagnosis not present

## 2016-07-23 DIAGNOSIS — M0579 Rheumatoid arthritis with rheumatoid factor of multiple sites without organ or systems involvement: Secondary | ICD-10-CM | POA: Diagnosis not present

## 2016-07-23 DIAGNOSIS — M150 Primary generalized (osteo)arthritis: Secondary | ICD-10-CM | POA: Diagnosis not present

## 2016-07-31 ENCOUNTER — Ambulatory Visit (INDEPENDENT_AMBULATORY_CARE_PROVIDER_SITE_OTHER): Payer: Medicare Other

## 2016-07-31 DIAGNOSIS — Z23 Encounter for immunization: Secondary | ICD-10-CM

## 2016-08-20 ENCOUNTER — Other Ambulatory Visit (HOSPITAL_COMMUNITY): Payer: Self-pay | Admitting: *Deleted

## 2016-08-21 ENCOUNTER — Encounter (HOSPITAL_COMMUNITY): Payer: Medicare Other

## 2016-08-21 ENCOUNTER — Ambulatory Visit (HOSPITAL_COMMUNITY)
Admission: RE | Admit: 2016-08-21 | Discharge: 2016-08-21 | Disposition: A | Discharge status: Home / Self Care | Payer: Medicare Other | Source: Ambulatory Visit | Attending: Internal Medicine | Admitting: Internal Medicine

## 2016-08-21 DIAGNOSIS — M0579 Rheumatoid arthritis with rheumatoid factor of multiple sites without organ or systems involvement: Secondary | ICD-10-CM | POA: Insufficient documentation

## 2016-08-21 MED ORDER — DIPHENHYDRAMINE HCL 25 MG PO CAPS: 25.0000 mg | ORAL_CAPSULE | ORAL | Status: DC

## 2016-08-21 MED ORDER — SODIUM CHLORIDE 0.9 % IV SOLN
8.0000 mg/kg | INTRAVENOUS | Status: DC
  Administered 2016-08-21: 500 mg via INTRAVENOUS
  Filled 2016-08-21: qty 50

## 2016-08-21 MED ORDER — ACETAMINOPHEN 500 MG PO TABS
1000.0000 mg | ORAL_TABLET | ORAL | Status: DC
  Administered 2016-08-21: 500 mg via ORAL

## 2016-08-21 MED ORDER — SODIUM CHLORIDE 0.9 % IV SOLN
INTRAVENOUS | Status: DC
  Administered 2016-08-21: 11:00:00 via INTRAVENOUS

## 2016-08-21 MED ORDER — METHYLPREDNISOLONE SODIUM SUCC 40 MG IJ SOLR
INTRAMUSCULAR | Status: AC
Start: 2016-08-21 — End: 2016-08-21
  Filled 2016-08-21: qty 1

## 2016-08-21 MED ORDER — SODIUM CHLORIDE 0.9 % IV SOLN: 5.0000 mg/kg | INTRAVENOUS | Status: DC

## 2016-08-21 MED ORDER — CETIRIZINE HCL 10 MG PO TABS
10.0000 mg | ORAL_TABLET | ORAL | Status: AC
  Administered 2016-08-21: 10 mg via ORAL
  Filled 2016-08-21: qty 1

## 2016-08-21 MED ORDER — METHYLPREDNISOLONE SODIUM SUCC 40 MG IJ SOLR: 40.0000 mg | INTRAMUSCULAR | Status: DC

## 2016-08-21 MED ORDER — METHYLPREDNISOLONE SODIUM SUCC 40 MG IJ SOLR
40.0000 mg | INTRAMUSCULAR | Status: DC
  Administered 2016-08-21: 40 mg via INTRAVENOUS

## 2016-08-21 MED ORDER — ACETAMINOPHEN 500 MG PO TABS
ORAL_TABLET | ORAL | Status: AC
Start: 2016-08-21 — End: 2016-08-21
  Filled 2016-08-21: qty 1

## 2016-08-21 MED ORDER — ACETAMINOPHEN 325 MG PO TABS: 650.0000 mg | ORAL_TABLET | ORAL | Status: DC

## 2016-08-21 MED ORDER — LORATADINE 10 MG PO TABS: 10.0000 mg | ORAL_TABLET | Freq: Every day | ORAL | Status: DC

## 2016-09-03 DIAGNOSIS — M0579 Rheumatoid arthritis with rheumatoid factor of multiple sites without organ or systems involvement: Secondary | ICD-10-CM | POA: Diagnosis not present

## 2016-09-03 DIAGNOSIS — M150 Primary generalized (osteo)arthritis: Secondary | ICD-10-CM | POA: Diagnosis not present

## 2016-09-16 ENCOUNTER — Other Ambulatory Visit: Payer: Self-pay | Admitting: Internal Medicine

## 2016-09-24 ENCOUNTER — Other Ambulatory Visit (HOSPITAL_COMMUNITY): Payer: Self-pay

## 2016-09-24 DIAGNOSIS — N1832 Chronic kidney disease, stage 3b: Secondary | ICD-10-CM | POA: Insufficient documentation

## 2016-09-24 DIAGNOSIS — N183 Chronic kidney disease, stage 3 (moderate): Secondary | ICD-10-CM

## 2016-09-24 DIAGNOSIS — M19042 Primary osteoarthritis, left hand: Secondary | ICD-10-CM

## 2016-09-24 DIAGNOSIS — M19041 Primary osteoarthritis, right hand: Secondary | ICD-10-CM | POA: Insufficient documentation

## 2016-09-24 DIAGNOSIS — M170 Bilateral primary osteoarthritis of knee: Secondary | ICD-10-CM | POA: Insufficient documentation

## 2016-09-24 DIAGNOSIS — Z79899 Other long term (current) drug therapy: Secondary | ICD-10-CM | POA: Insufficient documentation

## 2016-09-24 NOTE — Progress Notes (Deleted)
Office Visit Note  Patient: Gloria Warren             Date of Birth: [birth year]           MRN: [id number]             PCP: Binnie Rail, MD Referring: Binnie Rail, MD Visit Date: 09/27/2016 Occupation: @GUAROCC @    Subjective:  No chief complaint on file.   History of Present Illness: Gloria Warren is a 82 y.o. female ***   Activities of Daily Living:  Patient reports morning stiffness for *** {minute/hour:19697}.   Patient {ACTIONS;DENIES/REPORTS:21021675::"Denies"} nocturnal pain.  Difficulty dressing/grooming: {ACTIONS;DENIES/REPORTS:21021675::"Denies"} Difficulty climbing stairs: {ACTIONS;DENIES/REPORTS:21021675::"Denies"} Difficulty getting out of chair: {ACTIONS;DENIES/REPORTS:21021675::"Denies"} Difficulty using hands for taps, buttons, cutlery, and/or writing: {ACTIONS;DENIES/REPORTS:21021675::"Denies"}   No Rheumatology ROS completed.   PMFS History:  Patient Active Problem List   Diagnosis Date Noted  . Leg edema 11/28/2015  . GERD (gastroesophageal reflux disease) 11/28/2015  . Dysuria 03/25/2014  . Psoriasis 03/11/2013  . SKIN CANCER, HX OF 06/26/2010  . ALLERGIC RHINITIS 06/23/2009  . Hyperglycemia 06/23/2009  . Rheumatoid arthritis ([place]) 02/25/2008  . Hyperlipidemia 11/17/2007  . Essential hypertension 11/17/2007    Past Medical History:  Diagnosis Date  . Allergic rhinitis   . Endometriosis   . History of skin cancer    squamous & basal cell ;Dr Denna Haggard  . Hyperlipidemia   . Hypertension   . Psoriasis   . Rheumatoid arthritis(714.0)    Dr. Estanislado Pandy  . UTI (lower urinary tract infection) 5/15   Proteus , R to Nitrofurantoin & Penicillin    Family History  Problem Relation Age of Onset  . Breast cancer Mother 40  . Hypertension Mother   . Gout Mother   . Diabetes Maternal Aunt   . Hypertension Father   . Heart attack Father 64  . Hypertension Son   . Stroke Maternal Grandmother     late 38s  . Stroke Sister     doing well     Past Surgical History:  Procedure Laterality Date  . APPENDECTOMY     age 66  . COLONOSCOPY     Neg 2; [place] , [name]  . G 2  P 2    . TONSILLECTOMY    . TOTAL ABDOMINAL HYSTERECTOMY     age 60 for endometriosis (no BSO)   Social History   Social History Narrative  . No narrative on file     Objective: Vital Signs: There were no vitals taken for this visit.   Physical Exam   Musculoskeletal Exam: ***  CDAI Exam: No CDAI exam completed.    Investigation: Findings:  07/20/2008: RF 1470, CCP 407, Hepatitis panel negative, ANA negative 02/21/15: TB Gold negative 07/17/2016 CBC WBC 11.8, CMP creatinine 1.16 GFR 42    Imaging: No results found.  Speciality Comments: No specialty comments available.    Procedures:  No procedures performed Allergies: Arava [leflunomide]; Sulfonamide derivatives; and Remicade [infliximab]   Assessment / Plan: Visit Diagnoses: Rheumatoid arthritis with positive rheumatoid factor  -  positive CCP  High risk medication use - Remicade 5 mg/kg every 5 weeks, premedicated with Solu-Medrol 40 mg due to rash, plaquenil 200 mg a day, prednisone mg every morning  Primary osteoarthritis of both knees  Primary osteoarthritis of both hands  Chronic kidney disease (CKD) stage G3b - GFR 44    Orders: No orders of the defined types were placed in this encounter.  No orders of the  defined types were placed in this encounter.   Face-to-face time spent with patient was *** minutes. 50% of time was spent in counseling and coordination of care.  Follow-Up Instructions: No Follow-up on file.   Bo Merino, MD

## 2016-09-25 ENCOUNTER — Ambulatory Visit (HOSPITAL_COMMUNITY)
Admission: RE | Admit: 2016-09-25 | Discharge: 2016-09-25 | Disposition: A | Discharge status: Home / Self Care | Payer: Medicare Other | Source: Ambulatory Visit | Attending: Internal Medicine | Admitting: Internal Medicine

## 2016-09-25 DIAGNOSIS — M19071 Primary osteoarthritis, right ankle and foot: Secondary | ICD-10-CM | POA: Insufficient documentation

## 2016-09-25 DIAGNOSIS — M0579 Rheumatoid arthritis with rheumatoid factor of multiple sites without organ or systems involvement: Secondary | ICD-10-CM | POA: Diagnosis not present

## 2016-09-25 DIAGNOSIS — M19072 Primary osteoarthritis, left ankle and foot: Secondary | ICD-10-CM

## 2016-09-25 DIAGNOSIS — I7300 Raynaud's syndrome without gangrene: Secondary | ICD-10-CM | POA: Insufficient documentation

## 2016-09-25 LAB — CBC WITH DIFFERENTIAL/PLATELET
Basophils Absolute: 0 10*3/uL (ref 0.0–0.1)
Basophils Relative: 0 %
Eosinophils Absolute: 0.1 10*3/uL (ref 0.0–0.7)
Eosinophils Relative: 1 %
HEMATOCRIT: 40.1 % (ref 36.0–46.0)
Hemoglobin: 12.8 g/dL (ref 12.0–15.0)
LYMPHS PCT: 14 %
Lymphs Abs: 1.4 10*3/uL (ref 0.7–4.0)
MCH: 30.8 pg (ref 26.0–34.0)
MCHC: 31.9 g/dL (ref 30.0–36.0)
MCV: 96.6 fL (ref 78.0–100.0)
MONO ABS: 0.9 10*3/uL (ref 0.1–1.0)
MONOS PCT: 8 %
NEUTROS ABS: 7.9 10*3/uL — AB (ref 1.7–7.7)
Neutrophils Relative %: 77 %
Platelets: 290 10*3/uL (ref 150–400)
RBC: 4.15 MIL/uL (ref 3.87–5.11)
RDW: 16.1 % — AB (ref 11.5–15.5)
WBC: 10.3 10*3/uL (ref 4.0–10.5)

## 2016-09-25 LAB — COMPREHENSIVE METABOLIC PANEL
ALK PHOS: 58 U/L (ref 38–126)
ALT: 13 U/L — ABNORMAL LOW (ref 14–54)
ANION GAP: 9 (ref 5–15)
AST: 19 U/L (ref 15–41)
Albumin: 3.3 g/dL — ABNORMAL LOW (ref 3.5–5.0)
BILIRUBIN TOTAL: 0.7 mg/dL (ref 0.3–1.2)
BUN: 29 mg/dL — AB (ref 6–20)
CALCIUM: 9.4 mg/dL (ref 8.9–10.3)
CO2: 23 mmol/L (ref 22–32)
Chloride: 104 mmol/L (ref 101–111)
Creatinine, Ser: 1.09 mg/dL — ABNORMAL HIGH (ref 0.44–1.00)
GFR calc Af Amer: 53 mL/min — ABNORMAL LOW (ref >60)
GFR, EST NON AFRICAN AMERICAN: 46 mL/min — AB (ref >60)
Glucose, Bld: 104 mg/dL — ABNORMAL HIGH (ref 65–99)
POTASSIUM: 4.1 mmol/L (ref 3.5–5.1)
Sodium: 136 mmol/L (ref 135–145)
TOTAL PROTEIN: 6.8 g/dL (ref 6.5–8.1)

## 2016-09-25 MED ORDER — INFLIXIMAB 100 MG IV SOLR
8.0000 mg/kg | INTRAVENOUS | Status: DC
  Administered 2016-09-25: 500 mg via INTRAVENOUS
  Filled 2016-09-25: qty 50

## 2016-09-25 MED ORDER — ACETAMINOPHEN 500 MG PO TABS
ORAL_TABLET | ORAL | Status: AC
  Filled 2016-09-25: qty 2

## 2016-09-25 MED ORDER — LORATADINE 10 MG PO TABS
ORAL_TABLET | ORAL | Status: AC
  Filled 2016-09-25: qty 1

## 2016-09-25 MED ORDER — METHYLPREDNISOLONE SODIUM SUCC 40 MG IJ SOLR
INTRAMUSCULAR | Status: AC
  Filled 2016-09-25: qty 1

## 2016-09-25 MED ORDER — SODIUM CHLORIDE 0.9 % IV SOLN
INTRAVENOUS | Status: DC
  Administered 2016-09-25: 11:00:00 via INTRAVENOUS

## 2016-09-25 MED ORDER — LORATADINE 10 MG PO TABS
10.0000 mg | ORAL_TABLET | Freq: Every day | ORAL | Status: DC
  Administered 2016-09-25: 10 mg via ORAL

## 2016-09-25 MED ORDER — METHYLPREDNISOLONE SODIUM SUCC 40 MG IJ SOLR
40.0000 mg | INTRAMUSCULAR | Status: DC
  Administered 2016-09-25: 40 mg via INTRAVENOUS

## 2016-09-25 MED ORDER — ACETAMINOPHEN 500 MG PO TABS
1000.0000 mg | ORAL_TABLET | Freq: Once | ORAL | Status: AC
  Administered 2016-09-25: 1000 mg via ORAL

## 2016-09-26 ENCOUNTER — Encounter: Payer: Self-pay | Admitting: *Deleted

## 2016-09-27 ENCOUNTER — Ambulatory Visit: Payer: Medicare Other | Admitting: Rheumatology

## 2016-10-18 DIAGNOSIS — H353131 Nonexudative age-related macular degeneration, bilateral, early dry stage: Secondary | ICD-10-CM | POA: Diagnosis not present

## 2016-10-18 DIAGNOSIS — H524 Presbyopia: Secondary | ICD-10-CM | POA: Diagnosis not present

## 2016-10-18 DIAGNOSIS — H26493 Other secondary cataract, bilateral: Secondary | ICD-10-CM | POA: Diagnosis not present

## 2016-10-18 DIAGNOSIS — Z79899 Other long term (current) drug therapy: Secondary | ICD-10-CM | POA: Diagnosis not present

## 2016-10-18 DIAGNOSIS — H40013 Open angle with borderline findings, low risk, bilateral: Secondary | ICD-10-CM | POA: Diagnosis not present

## 2016-10-24 ENCOUNTER — Ambulatory Visit: Payer: Medicare Other

## 2016-10-24 ENCOUNTER — Ambulatory Visit: Payer: Medicare Other | Admitting: Obstetrics and Gynecology

## 2016-10-29 ENCOUNTER — Other Ambulatory Visit (HOSPITAL_COMMUNITY): Payer: Self-pay | Admitting: *Deleted

## 2016-10-30 ENCOUNTER — Ambulatory Visit (HOSPITAL_COMMUNITY)
Admission: RE | Admit: 2016-10-30 | Discharge: 2016-10-30 | Disposition: A | Discharge status: Home / Self Care | Payer: Medicare Other | Source: Ambulatory Visit | Attending: Internal Medicine | Admitting: Internal Medicine

## 2016-10-30 DIAGNOSIS — M0579 Rheumatoid arthritis with rheumatoid factor of multiple sites without organ or systems involvement: Secondary | ICD-10-CM | POA: Insufficient documentation

## 2016-10-30 LAB — CBC WITH DIFFERENTIAL/PLATELET
BASOS ABS: 0 10*3/uL (ref 0.0–0.1)
BASOS PCT: 0 %
EOS ABS: 0 10*3/uL (ref 0.0–0.7)
EOS PCT: 0 %
HCT: 43.2 % (ref 36.0–46.0)
HEMOGLOBIN: 13.8 g/dL (ref 12.0–15.0)
LYMPHS ABS: 0.9 10*3/uL (ref 0.7–4.0)
Lymphocytes Relative: 10 %
MCH: 30.9 pg (ref 26.0–34.0)
MCHC: 31.9 g/dL (ref 30.0–36.0)
MCV: 96.6 fL (ref 78.0–100.0)
Monocytes Absolute: 0.6 10*3/uL (ref 0.1–1.0)
Monocytes Relative: 7 %
NEUTROS PCT: 83 %
Neutro Abs: 7.5 10*3/uL (ref 1.7–7.7)
PLATELETS: 297 10*3/uL (ref 150–400)
RBC: 4.47 MIL/uL (ref 3.87–5.11)
RDW: 15.1 % (ref 11.5–15.5)
WBC: 9 10*3/uL (ref 4.0–10.5)

## 2016-10-30 LAB — COMPREHENSIVE METABOLIC PANEL
ALBUMIN: 3.5 g/dL (ref 3.5–5.0)
ALK PHOS: 53 U/L (ref 38–126)
ALT: 14 U/L (ref 14–54)
AST: 23 U/L (ref 15–41)
Anion gap: 10 (ref 5–15)
BUN: 21 mg/dL — AB (ref 6–20)
CALCIUM: 10.3 mg/dL (ref 8.9–10.3)
CHLORIDE: 105 mmol/L (ref 101–111)
CO2: 25 mmol/L (ref 22–32)
CREATININE: 1.1 mg/dL — AB (ref 0.44–1.00)
GFR calc non Af Amer: 45 mL/min — ABNORMAL LOW (ref >60)
GFR, EST AFRICAN AMERICAN: 52 mL/min — AB (ref >60)
GLUCOSE: 99 mg/dL (ref 65–99)
Potassium: 4.3 mmol/L (ref 3.5–5.1)
SODIUM: 140 mmol/L (ref 135–145)
Total Bilirubin: 0.6 mg/dL (ref 0.3–1.2)
Total Protein: 7.1 g/dL (ref 6.5–8.1)

## 2016-10-30 MED ORDER — LORATADINE 10 MG PO TABS
ORAL_TABLET | ORAL | Status: AC
  Administered 2016-10-30: 10 mg via ORAL
  Filled 2016-10-30: qty 1

## 2016-10-30 MED ORDER — ACETAMINOPHEN 500 MG PO TABS
1000.0000 mg | ORAL_TABLET | Freq: Once | ORAL | Status: AC
  Administered 2016-10-30: 1000 mg via ORAL

## 2016-10-30 MED ORDER — SODIUM CHLORIDE 0.9 % IV SOLN
8.0000 mg/kg | Freq: Once | INTRAVENOUS | Status: AC
  Administered 2016-10-30: 500 mg via INTRAVENOUS
  Filled 2016-10-30: qty 50

## 2016-10-30 MED ORDER — ACETAMINOPHEN 500 MG PO TABS
ORAL_TABLET | ORAL | Status: AC
  Administered 2016-10-30: 1000 mg via ORAL
  Filled 2016-10-30: qty 2

## 2016-10-30 MED ORDER — SODIUM CHLORIDE 0.9 % IV SOLN
Freq: Once | INTRAVENOUS | Status: AC
  Administered 2016-10-30: 11:00:00 via INTRAVENOUS

## 2016-10-30 MED ORDER — METHYLPREDNISOLONE SODIUM SUCC 40 MG IJ SOLR
40.0000 mg | Freq: Once | INTRAMUSCULAR | Status: AC
  Administered 2016-10-30: 40 mg via INTRAVENOUS

## 2016-10-30 MED ORDER — METHYLPREDNISOLONE SODIUM SUCC 40 MG IJ SOLR
INTRAMUSCULAR | Status: AC
  Administered 2016-10-30: 40 mg via INTRAVENOUS
  Filled 2016-10-30: qty 1

## 2016-10-30 MED ORDER — LORATADINE 10 MG PO TABS
10.0000 mg | ORAL_TABLET | Freq: Once | ORAL | Status: AC
  Administered 2016-10-30: 10 mg via ORAL

## 2016-10-30 NOTE — Progress Notes (Signed)
Pt tolerated Remicade infusion well, no complaints, VSS at time of DC home.

## 2016-11-11 ENCOUNTER — Other Ambulatory Visit: Payer: Self-pay | Admitting: Internal Medicine

## 2016-11-19 DIAGNOSIS — M0579 Rheumatoid arthritis with rheumatoid factor of multiple sites without organ or systems involvement: Secondary | ICD-10-CM | POA: Diagnosis not present

## 2016-11-19 DIAGNOSIS — M150 Primary generalized (osteo)arthritis: Secondary | ICD-10-CM | POA: Diagnosis not present

## 2016-12-03 DIAGNOSIS — M0579 Rheumatoid arthritis with rheumatoid factor of multiple sites without organ or systems involvement: Secondary | ICD-10-CM | POA: Diagnosis not present

## 2016-12-04 ENCOUNTER — Ambulatory Visit (HOSPITAL_COMMUNITY): Payer: Medicare Other

## 2016-12-09 ENCOUNTER — Inpatient Hospital Stay (HOSPITAL_COMMUNITY)
Admission: EM | Admit: 2016-12-09 | Discharge: 2016-12-12 | DRG: 392 | Disposition: A | Discharge status: Home / Self Care | Payer: Medicare Other | Attending: Internal Medicine | Admitting: Internal Medicine

## 2016-12-09 ENCOUNTER — Encounter (HOSPITAL_COMMUNITY): Payer: Self-pay | Admitting: Emergency Medicine

## 2016-12-09 DIAGNOSIS — M069 Rheumatoid arthritis, unspecified: Secondary | ICD-10-CM | POA: Diagnosis present

## 2016-12-09 DIAGNOSIS — N179 Acute kidney failure, unspecified: Secondary | ICD-10-CM | POA: Diagnosis present

## 2016-12-09 DIAGNOSIS — Z79899 Other long term (current) drug therapy: Secondary | ICD-10-CM | POA: Diagnosis not present

## 2016-12-09 DIAGNOSIS — E785 Hyperlipidemia, unspecified: Secondary | ICD-10-CM | POA: Diagnosis present

## 2016-12-09 DIAGNOSIS — Z85828 Personal history of other malignant neoplasm of skin: Secondary | ICD-10-CM

## 2016-12-09 DIAGNOSIS — Z8249 Family history of ischemic heart disease and other diseases of the circulatory system: Secondary | ICD-10-CM

## 2016-12-09 DIAGNOSIS — E876 Hypokalemia: Secondary | ICD-10-CM | POA: Diagnosis present

## 2016-12-09 DIAGNOSIS — I7300 Raynaud's syndrome without gangrene: Secondary | ICD-10-CM | POA: Diagnosis present

## 2016-12-09 DIAGNOSIS — K2970 Gastritis, unspecified, without bleeding: Secondary | ICD-10-CM | POA: Diagnosis not present

## 2016-12-09 DIAGNOSIS — M170 Bilateral primary osteoarthritis of knee: Secondary | ICD-10-CM | POA: Diagnosis not present

## 2016-12-09 DIAGNOSIS — J301 Allergic rhinitis due to pollen: Secondary | ICD-10-CM | POA: Diagnosis not present

## 2016-12-09 DIAGNOSIS — I10 Essential (primary) hypertension: Secondary | ICD-10-CM | POA: Diagnosis present

## 2016-12-09 DIAGNOSIS — M0579 Rheumatoid arthritis with rheumatoid factor of multiple sites without organ or systems involvement: Secondary | ICD-10-CM | POA: Diagnosis not present

## 2016-12-09 DIAGNOSIS — Z7952 Long term (current) use of systemic steroids: Secondary | ICD-10-CM

## 2016-12-09 DIAGNOSIS — Z882 Allergy status to sulfonamides status: Secondary | ICD-10-CM | POA: Diagnosis not present

## 2016-12-09 DIAGNOSIS — R600 Localized edema: Secondary | ICD-10-CM | POA: Diagnosis present

## 2016-12-09 DIAGNOSIS — K529 Noninfective gastroenteritis and colitis, unspecified: Principal | ICD-10-CM | POA: Diagnosis present

## 2016-12-09 DIAGNOSIS — K219 Gastro-esophageal reflux disease without esophagitis: Secondary | ICD-10-CM | POA: Diagnosis present

## 2016-12-09 DIAGNOSIS — E860 Dehydration: Secondary | ICD-10-CM | POA: Diagnosis present

## 2016-12-09 DIAGNOSIS — R112 Nausea with vomiting, unspecified: Secondary | ICD-10-CM | POA: Diagnosis not present

## 2016-12-09 DIAGNOSIS — R6510 Systemic inflammatory response syndrome (SIRS) of non-infectious origin without acute organ dysfunction: Secondary | ICD-10-CM | POA: Diagnosis present

## 2016-12-09 DIAGNOSIS — Z888 Allergy status to other drugs, medicaments and biological substances status: Secondary | ICD-10-CM | POA: Diagnosis not present

## 2016-12-09 DIAGNOSIS — J309 Allergic rhinitis, unspecified: Secondary | ICD-10-CM | POA: Diagnosis present

## 2016-12-09 DIAGNOSIS — A419 Sepsis, unspecified organism: Secondary | ICD-10-CM | POA: Diagnosis not present

## 2016-12-09 LAB — CBC
HEMATOCRIT: 42 % (ref 36.0–46.0)
Hemoglobin: 13 g/dL (ref 12.0–15.0)
MCH: 30.1 pg (ref 26.0–34.0)
MCHC: 31 g/dL (ref 30.0–36.0)
MCV: 97.2 fL (ref 78.0–100.0)
Platelets: 232 10*3/uL (ref 150–400)
RBC: 4.32 MIL/uL (ref 3.87–5.11)
RDW: 15 % (ref 11.5–15.5)
WBC: 17.2 10*3/uL — AB (ref 4.0–10.5)

## 2016-12-09 LAB — URINALYSIS, ROUTINE W REFLEX MICROSCOPIC
BILIRUBIN URINE: NEGATIVE
Glucose, UA: NEGATIVE mg/dL
KETONES UR: NEGATIVE mg/dL
Nitrite: POSITIVE — AB
PH: 7 (ref 5.0–8.0)
PROTEIN: 100 mg/dL — AB
Specific Gravity, Urine: 1.02 (ref 1.005–1.030)

## 2016-12-09 LAB — CBC WITH DIFFERENTIAL/PLATELET
BASOS ABS: 0 10*3/uL (ref 0.0–0.1)
Basophils Relative: 0 %
Eosinophils Absolute: 0 10*3/uL (ref 0.0–0.7)
Eosinophils Relative: 0 %
HCT: 45.8 % (ref 36.0–46.0)
HEMOGLOBIN: 14.5 g/dL (ref 12.0–15.0)
LYMPHS PCT: 7 %
Lymphs Abs: 1.4 10*3/uL (ref 0.7–4.0)
MCH: 30.7 pg (ref 26.0–34.0)
MCHC: 31.7 g/dL (ref 30.0–36.0)
MCV: 97 fL (ref 78.0–100.0)
Monocytes Absolute: 0.8 10*3/uL (ref 0.1–1.0)
Monocytes Relative: 4 %
NEUTROS ABS: 17.4 10*3/uL — AB (ref 1.7–7.7)
Neutrophils Relative %: 89 %
Platelets: 281 10*3/uL (ref 150–400)
RBC: 4.72 MIL/uL (ref 3.87–5.11)
RDW: 15.2 % (ref 11.5–15.5)
WBC: 19.6 10*3/uL — ABNORMAL HIGH (ref 4.0–10.5)

## 2016-12-09 LAB — COMPREHENSIVE METABOLIC PANEL
ALBUMIN: 3.4 g/dL — AB (ref 3.5–5.0)
ALK PHOS: 53 U/L (ref 38–126)
ALT: 16 U/L (ref 14–54)
ANION GAP: 13 (ref 5–15)
AST: 23 U/L (ref 15–41)
BUN: 47 mg/dL — ABNORMAL HIGH (ref 6–20)
CALCIUM: 9 mg/dL (ref 8.9–10.3)
CO2: 23 mmol/L (ref 22–32)
CREATININE: 1.57 mg/dL — AB (ref 0.44–1.00)
Chloride: 106 mmol/L (ref 101–111)
GFR calc Af Amer: 34 mL/min — ABNORMAL LOW (ref >60)
GFR calc non Af Amer: 29 mL/min — ABNORMAL LOW (ref >60)
GLUCOSE: 111 mg/dL — AB (ref 65–99)
Potassium: 3.7 mmol/L (ref 3.5–5.1)
Sodium: 142 mmol/L (ref 135–145)
Total Bilirubin: 0.6 mg/dL (ref 0.3–1.2)
Total Protein: 6.6 g/dL (ref 6.5–8.1)

## 2016-12-09 LAB — CLOSTRIDIUM DIFFICILE BY PCR: CDIFFPCR: POSITIVE — AB

## 2016-12-09 LAB — CREATININE, SERUM
Creatinine, Ser: 1.37 mg/dL — ABNORMAL HIGH (ref 0.44–1.00)
GFR, EST AFRICAN AMERICAN: 40 mL/min — AB (ref >60)
GFR, EST NON AFRICAN AMERICAN: 35 mL/min — AB (ref >60)

## 2016-12-09 LAB — LACTIC ACID, PLASMA
LACTIC ACID, VENOUS: 2.9 mmol/L — AB (ref 0.5–1.9)
Lactic Acid, Venous: 2 mmol/L (ref 0.5–1.9)

## 2016-12-09 LAB — C DIFFICILE QUICK SCREEN W PCR REFLEX
C Diff antigen: POSITIVE — AB
C Diff toxin: NEGATIVE

## 2016-12-09 LAB — INFLUENZA PANEL BY PCR (TYPE A & B)
INFLBPCR: NEGATIVE
Influenza A By PCR: NEGATIVE

## 2016-12-09 MED ORDER — DEXTROSE 5 % IV SOLN
1.0000 g | Freq: Once | INTRAVENOUS | Status: AC
  Administered 2016-12-09: 1 g via INTRAVENOUS
  Filled 2016-12-09: qty 10

## 2016-12-09 MED ORDER — VANCOMYCIN 50 MG/ML ORAL SOLUTION
125.0000 mg | Freq: Four times a day (QID) | ORAL | Status: DC
  Administered 2016-12-09 – 2016-12-12 (×10): 125 mg via ORAL
  Filled 2016-12-09 (×12): qty 2.5

## 2016-12-09 MED ORDER — METRONIDAZOLE IN NACL 5-0.79 MG/ML-% IV SOLN
500.0000 mg | Freq: Three times a day (TID) | INTRAVENOUS | Status: DC
  Administered 2016-12-09 (×3): 500 mg via INTRAVENOUS
  Filled 2016-12-09 (×4): qty 100

## 2016-12-09 MED ORDER — ONDANSETRON HCL 4 MG PO TABS: 4.0000 mg | ORAL_TABLET | Freq: Four times a day (QID) | ORAL | Status: DC | PRN

## 2016-12-09 MED ORDER — CALCIUM CARBONATE 1250 (500 CA) MG PO TABS
1250.0000 mg | ORAL_TABLET | Freq: Two times a day (BID) | ORAL | Status: DC
  Administered 2016-12-09 – 2016-12-12 (×6): 1250 mg via ORAL
  Filled 2016-12-09 (×6): qty 1

## 2016-12-09 MED ORDER — SODIUM CHLORIDE 0.9 % IV BOLUS (SEPSIS)
500.0000 mL | Freq: Once | INTRAVENOUS | Status: AC
  Administered 2016-12-09: 500 mL via INTRAVENOUS

## 2016-12-09 MED ORDER — HYDROCORTISONE NA SUCCINATE PF 100 MG IJ SOLR
50.0000 mg | Freq: Two times a day (BID) | INTRAMUSCULAR | Status: DC
  Administered 2016-12-09 (×2): 50 mg via INTRAVENOUS
  Filled 2016-12-09 (×3): qty 2

## 2016-12-09 MED ORDER — HYDROXYCHLOROQUINE SULFATE 200 MG PO TABS
200.0000 mg | ORAL_TABLET | Freq: Every day | ORAL | Status: DC
  Administered 2016-12-09 – 2016-12-12 (×4): 200 mg via ORAL
  Filled 2016-12-09 (×4): qty 1

## 2016-12-09 MED ORDER — CEFTRIAXONE SODIUM 1 G IJ SOLR
1.0000 g | Freq: Once | INTRAMUSCULAR | Status: AC
  Administered 2016-12-09: 1 g via INTRAVENOUS
  Filled 2016-12-09: qty 10

## 2016-12-09 MED ORDER — LORATADINE 10 MG PO TABS
10.0000 mg | ORAL_TABLET | Freq: Every day | ORAL | Status: DC
  Administered 2016-12-09 – 2016-12-12 (×4): 10 mg via ORAL
  Filled 2016-12-09 (×4): qty 1

## 2016-12-09 MED ORDER — VITAMIN D 1000 UNITS PO TABS
1000.0000 [IU] | ORAL_TABLET | Freq: Every day | ORAL | Status: DC
  Administered 2016-12-09 – 2016-12-12 (×4): 1000 [IU] via ORAL
  Filled 2016-12-09 (×4): qty 1

## 2016-12-09 MED ORDER — ONDANSETRON HCL 4 MG/2ML IJ SOLN
4.0000 mg | Freq: Four times a day (QID) | INTRAMUSCULAR | Status: DC | PRN
  Administered 2016-12-11 – 2016-12-12 (×2): 4 mg via INTRAVENOUS
  Filled 2016-12-09 (×3): qty 2

## 2016-12-09 MED ORDER — MORPHINE SULFATE (PF) 2 MG/ML IV SOLN: 1.0000 mg | INTRAVENOUS | Status: DC | PRN

## 2016-12-09 MED ORDER — SODIUM CHLORIDE 0.9 % IV SOLN
INTRAVENOUS | Status: DC
  Administered 2016-12-09 (×2): via INTRAVENOUS

## 2016-12-09 MED ORDER — ACETAMINOPHEN 650 MG RE SUPP: 650.0000 mg | Freq: Four times a day (QID) | RECTAL | Status: DC | PRN

## 2016-12-09 MED ORDER — FENTANYL CITRATE (PF) 100 MCG/2ML IJ SOLN
50.0000 ug | Freq: Once | INTRAMUSCULAR | Status: AC
  Administered 2016-12-09: 50 ug via INTRAVENOUS
  Filled 2016-12-09: qty 2

## 2016-12-09 MED ORDER — ONDANSETRON HCL 4 MG/2ML IJ SOLN
4.0000 mg | Freq: Once | INTRAMUSCULAR | Status: AC
  Administered 2016-12-09: 4 mg via INTRAVENOUS
  Filled 2016-12-09: qty 2

## 2016-12-09 MED ORDER — ACETAMINOPHEN 325 MG PO TABS
650.0000 mg | ORAL_TABLET | Freq: Four times a day (QID) | ORAL | Status: DC | PRN
  Administered 2016-12-09 – 2016-12-12 (×3): 650 mg via ORAL
  Filled 2016-12-09 (×3): qty 2

## 2016-12-09 MED ORDER — TRAMADOL HCL 50 MG PO TABS
50.0000 mg | ORAL_TABLET | Freq: Four times a day (QID) | ORAL | Status: DC | PRN
Start: 2016-12-09 — End: 2016-12-12
  Administered 2016-12-11 – 2016-12-12 (×2): 50 mg via ORAL
  Filled 2016-12-09 (×2): qty 1

## 2016-12-09 MED ORDER — DEXTROSE 5 % IV SOLN
2.0000 g | INTRAVENOUS | Status: DC
  Administered 2016-12-10: 2 g via INTRAVENOUS
  Filled 2016-12-09: qty 2

## 2016-12-09 MED ORDER — FAMOTIDINE 20 MG PO TABS
20.0000 mg | ORAL_TABLET | Freq: Every day | ORAL | Status: DC
  Administered 2016-12-09 – 2016-12-11 (×3): 20 mg via ORAL
  Filled 2016-12-09 (×2): qty 1

## 2016-12-09 MED ORDER — ENOXAPARIN SODIUM 30 MG/0.3ML ~~LOC~~ SOLN
30.0000 mg | SUBCUTANEOUS | Status: DC
  Administered 2016-12-09 – 2016-12-12 (×4): 30 mg via SUBCUTANEOUS
  Filled 2016-12-09 (×4): qty 0.3

## 2016-12-09 MED ORDER — ALBUTEROL SULFATE (2.5 MG/3ML) 0.083% IN NEBU: 2.5000 mg | INHALATION_SOLUTION | RESPIRATORY_TRACT | Status: DC | PRN

## 2016-12-09 NOTE — ED Notes (Signed)
Attempted to collect urine specimen without success.  Having liquid stools.

## 2016-12-09 NOTE — Progress Notes (Signed)
PT Cancellation Note  Patient Details Name: Gloria Warren MRN: [id number] DOB: 1931/03/15   Cancelled Treatment:    Reason Eval/Treat Not Completed: Patient declined, no reason specified;Medical issues which prohibited therapy. Pt c/o continuing to have diarrhea and is not ready for therapy this session. Reports increased fatigue and requests PT hold until tomorrow.    Scheryl Marten PT, DPT  [phone]  12/09/2016, 2:59 PM

## 2016-12-09 NOTE — Progress Notes (Signed)
Patient tested positive for Cdiff. Oncall MD notified. Pt was already placed on contact precautions. Will educate on the importance of preventing spread of infection. Will continue to assess

## 2016-12-09 NOTE — ED Notes (Signed)
Delay in lab draw,  PA in with pt at this time.

## 2016-12-09 NOTE — ED Provider Notes (Signed)
[facility] DEPT Provider Note   CSN: [id number] Arrival date & time: 12/09/16  0423     History   Chief Complaint Chief Complaint  Patient presents with  . Nausea  . Emesis  . Diarrhea    HPI Gloria Warren is a 83 y.o. female.  After dinner last night started feeling bad, with vomiting and diarrhea. Nonstop since with >10 episodes of each since it began. No fever, or abdominal pain. Nonbloody emesis and diarrhea. She feels generally weak. No sick contacts at home. She is having some abdominal cramping but no significant abdominal pain. She reports a history of rheumatoid arthritis on a new medication she started as an injection last week. No interim symptoms or side effects.   The history is provided by the patient. No language interpreter was used.  Emesis   Associated symptoms include abdominal pain and diarrhea. Pertinent negatives include no chills, no fever and no myalgias.  Diarrhea   Associated symptoms include abdominal pain and vomiting. Pertinent negatives include no chills and no myalgias.    Past Medical History:  Diagnosis Date  . Allergic rhinitis   . Decreased GFR   . Endometriosis   . History of skin cancer    squamous & basal cell ;Dr Denna Haggard  . Hyperlipidemia   . Hypertension   . Osteoarthritis   . Psoriasis   . Raynaud disease   . Rheumatoid arthritis(714.0)    Dr. Estanislado Pandy  . UTI (lower urinary tract infection) 5/15   Proteus , R to Nitrofurantoin & Penicillin    Patient Active Problem List   Diagnosis Date Noted  . Osteoarthritis of both feet 09/25/2016  . Raynaud disease 09/25/2016  . High risk medication use 09/24/2016  . Primary osteoarthritis of both knees 09/24/2016  . Primary osteoarthritis of both hands 09/24/2016  . Chronic kidney disease (CKD) stage G3b 09/24/2016  . Leg edema 11/28/2015  . GERD (gastroesophageal reflux disease) 11/28/2015  . Dysuria 03/25/2014  . Psoriasis 03/11/2013  . SKIN CANCER, HX OF 06/26/2010    . ALLERGIC RHINITIS 06/23/2009  . Hyperglycemia 06/23/2009  . Rheumatoid arthritis ([place]) 02/25/2008  . Hyperlipidemia 11/17/2007  . Essential hypertension 11/17/2007    Past Surgical History:  Procedure Laterality Date  . APPENDECTOMY     age 68  . CATARACT EXTRACTION Bilateral   . COLONOSCOPY     Neg 2; [place] , [place]  . G 2  P 2    . TONSILLECTOMY    . TOTAL ABDOMINAL HYSTERECTOMY     age 62 for endometriosis (no BSO)    OB History    Gravida Para Term Preterm AB Living   2 2 2     1    SAB TAB Ectopic Multiple Live Births                  Obstetric Comments   Daughter passed away in 03/10/2005 to AVM.       Home Medications    Prior to Admission medications   Medication Sig Start Date End Date Taking? Authorizing Provider  acetaminophen (TYLENOL) 325 MG tablet Take 650 mg by mouth once. One hour prior to infusion    Historical Provider, MD  benazepril (LOTENSIN) 10 MG tablet TAKE 1 TABLET(10 MG) BY MOUTH DAILY 09/17/16   Binnie Rail, MD  Calcium Carbonate (CALTRATE 600 PO) Take 600 mg by mouth 2 (two) times daily.      Historical Provider, MD  chlorhexidine (PERIDEX) 0.12 % solution 1 mL  by Mouth Rinse route daily. 08/18/13   Historical Provider, MD  cholecalciferol (VITAMIN D) 1000 units tablet Take 1,000 Units by mouth daily.    Historical Provider, MD  diclofenac sodium (VOLTAREN) 1 % GEL APP 1 GRAM EXT AA TID PRF PAIN 03/29/16   Historical Provider, MD  diphenhydrAMINE (BENADRYL) 25 mg capsule Take 25 mg by mouth once. One hour Prior to infusion    Historical Provider, MD  Fexofenadine HCl (ALLEGRA PO) Take 1 tablet by mouth at bedtime.     Historical Provider, MD  fluocinonide ointment (LIDEX) 0.05 %  08/20/14   Historical Provider, MD  furosemide (LASIX) 20 MG tablet Take 1 tablet (20 mg total) by mouth daily. 04/24/16   Binnie Rail, MD  hydroxychloroquine (PLAQUENIL) 200 MG tablet Take 200 mg by mouth daily. 05/07/16   Historical Provider, MD  inFLIXimab (REMICADE)  100 MG injection Inject 5 mg/kg into the vein. Infuse 300mg  / 3vials in 250mL NS over 2 hours every 5 weeks    Bo Merino, MD  methylPREDNISolone sodium succinate (SOLU-MEDROL) 40 mg/mL injection Inject 40 mg into the vein once. Prior to infusion    Historical Provider, MD  Multiple Vitamins-Minerals (VISION-VITE PRESERVE PO) Take by mouth.    Historical Provider, MD  predniSONE (DELTASONE) 10 MG tablet TK 1 T PO QD 03/27/16   Historical Provider, MD  ranitidine (ZANTAC) 150 MG tablet Take 1 tablet (150 mg total) by mouth at bedtime. --- office visit needed for further refills. 11/13/16   Binnie Rail, MD  traMADol (ULTRAM) 50 MG tablet Take 50 mg by mouth every 6 (six) hours as needed.    Historical Provider, MD    Family History Family History  Problem Relation Age of Onset  . Breast cancer Mother 58  . Hypertension Mother   . Gout Mother   . Hypertension Father   . Heart attack Father 106  . Stroke Sister     doing well  . Diabetes Maternal Aunt   . Hypertension Son   . Stroke Maternal Grandmother     late 62s    Social History Social History  Substance Use Topics  . Smoking status: Never Smoker  . Smokeless tobacco: Never Used  . Alcohol use 0.0 oz/week     Comment:  rarely     Allergies   Arava [leflunomide]; Sulfonamide derivatives; and Remicade [infliximab]   Review of Systems Review of Systems  Constitutional: Positive for fatigue. Negative for chills and fever.  HENT: Negative.   Respiratory: Negative.   Cardiovascular: Negative.   Gastrointestinal: Positive for abdominal pain, diarrhea, nausea and vomiting.  Genitourinary: Negative.   Musculoskeletal: Negative.  Negative for back pain and myalgias.  Skin: Negative.   Neurological: Positive for weakness.     Physical Exam Updated Vital Signs BP 101/61   Pulse 114   Temp 98.9 F (37.2 C) (Oral)   Resp 20   Ht 5' (1.524 m)   Wt 59.4 kg   SpO2 95%   BMI 25.58 kg/m   Physical Exam    Constitutional: She is oriented to person, place, and time. She appears well-developed and well-nourished.  HENT:  Head: Normocephalic.  Mouth/Throat: Mucous membranes are dry.  Neck: Normal range of motion. Neck supple.  Cardiovascular: Regular rhythm.  Tachycardia present.   Pulmonary/Chest: Effort normal and breath sounds normal. She has no wheezes. She has no rales.  Abdominal: Soft. Bowel sounds are normal. There is tenderness (Diffuse abdominal tenderness. ). There is no  rebound and no guarding.  Musculoskeletal: Normal range of motion.  Neurological: She is alert and oriented to person, place, and time.  Skin: Skin is warm and dry. No rash noted.  Psychiatric: She has a normal mood and affect.     ED Treatments / Results  Labs (all labs ordered are listed, but only abnormal results are displayed) Labs Reviewed - No data to display  EKG  EKG Interpretation None       Radiology No results found.  Procedures Procedures (including critical care time)  Medications Ordered in ED Medications - No data to display   Initial Impression / Assessment and Plan / ED Course  I have reviewed the triage vital signs and the nursing notes.  Pertinent labs & imaging results that were available during my care of the patient were reviewed by me and considered in my medical decision making (see chart for details).     Patient presents with complaint of nausea and vomiting with abdominal cramping x 1 day. No fever. She is tachycardic and slightly hypotensive. She reports "my blood pressure is always low". She does not appear in any acute distress.   Labs show significant leukocytosis and a lactate of 2.9 indicating sepsis. Tachycardia is improved with IVF's. Code sepsis called. Urine was collected by catheter and appears cloudy and foul smelling per nursing. Suspect UTI. Rocephin ordered. Blood and urine cultures pending.   Anticipate admission. Patient care signed out to Dr. Delphina Cahill for admission disposition. Patient and husband updated on plan.  Final Clinical Impressions(s) / ED Diagnoses   Final diagnoses:  None   1. Sepsis 2. Nausea and vomiting  New Prescriptions New Prescriptions   No medications on file     Charlann Lange, PA-C 12/09/16 [place], MD 12/09/16 [phone]

## 2016-12-09 NOTE — H&P (Signed)
HISTORY AND PHYSICAL       PATIENT DETAILS Name: Gloria Warren Age: 83 y.o. Sex: female Date of Birth: 13-Jul-1931 Admit Date: 12/09/2016 [id number] [name], MD   Patient coming from: Home   CHIEF COMPLAINT:  Vomiting/Diarrhea since last evening.  HPI: Gloria Warren is a 83 y.o. female with medical history significant of rheumatoid arthritis on chronic prednisone (20 mg daily) and biological infusions monthly (previously on Remicade infusion-and apparently just started a new biologic last week) presented to the ED for evaluation of nausea, vomiting and diarrhea that started last evening.  Per patient, she was in her usual state of health until last evening-she had dinner at home following which she has had numerous episodes of nausea, vomiting and diarrhea. Patient claims that she has had close to 10-15 bowel movements overnight. There is no blood in stool, but the stool does appear to have some mucous. Vomitus is mostly clear liquids without any blood. There is no fever or abdominal pain.  Patient was evaluated in the emergency room, where she was found to have significant leukocytosis, acute kidney injury and I was asked to admit this patient for further evaluation and treatment.   ED Course:  Given IV fluids, ceftriaxone-stool studies ordered.  Note: Lives at: Home Mobility:  Independent Chronic Indwelling Foley:no   REVIEW OF SYSTEMS:  Constitutional:   No  weight loss, night sweats,  Fevers, chills, fatigue.  HEENT:    No headaches, Dysphagia,Tooth/dental problems,Sore throat,  No sneezing, itching, ear ache, nasal congestion, post nasal drip  Cardio-vascular: No chest pain,Orthopnea, PND,lower extremity edema, anasarca, palpitations  GI:  No heartburn, indigestion, abdominal pain,melena or hematochezia  Resp: No shortness of breath, cough, hemoptysis,plueritic chest pain.   Skin:  No rash or lesions.  GU:  No dysuria, change in color of  urine, no urgency or frequency.  No flank pain.  Musculoskeletal: No joint pain or swelling.  No decreased range of motion.  No back pain.  Endocrine: No heat intolerance, no cold intolerance, no polyuria, no polydipsia  Psych: No change in mood or affect. No depression or anxiety.  No memory loss.   ALLERGIES:   Allergies  Allergen Reactions  . Arava [Leflunomide]     Liver & kidney dysfunction  . Sulfonamide Derivatives     Itching & rash  . Remicade [Infliximab] Hives and Itching    PAST MEDICAL HISTORY: Past Medical History:  Diagnosis Date  . Allergic rhinitis   . Decreased GFR   . Endometriosis   . History of skin cancer    squamous & basal cell ;Dr Denna Haggard  . Hyperlipidemia   . Hypertension   . Osteoarthritis   . Psoriasis   . Raynaud disease   . Rheumatoid arthritis(714.0)    Dr. Estanislado Pandy  . UTI (lower urinary tract infection) 5/15   Proteus , R to Nitrofurantoin & Penicillin    PAST SURGICAL HISTORY: Past Surgical History:  Procedure Laterality Date  . APPENDECTOMY     age 64  . CATARACT EXTRACTION Bilateral   . COLONOSCOPY     Neg 2; [place] , [place]  . G 2  P 2    . TONSILLECTOMY    . TOTAL ABDOMINAL HYSTERECTOMY     age 83 for endometriosis (no BSO)    MEDICATIONS AT HOME: Prior to Admission medications   Medication Sig Start Date End Date Taking? Authorizing Provider  acetaminophen (TYLENOL) 325 MG tablet Take 650 mg  by mouth once. One hour prior to infusion    Historical Provider, MD  benazepril (LOTENSIN) 10 MG tablet TAKE 1 TABLET(10 MG) BY MOUTH DAILY 09/17/16   Binnie Rail, MD  Calcium Carbonate (CALTRATE 600 PO) Take 600 mg by mouth 2 (two) times daily.      Historical Provider, MD  chlorhexidine (PERIDEX) 0.12 % solution 1 mL by Mouth Rinse route daily. 08/18/13   Historical Provider, MD  cholecalciferol (VITAMIN D) 1000 units tablet Take 1,000 Units by mouth daily.    Historical Provider, MD  diclofenac sodium (VOLTAREN) 1 % GEL APP  1 GRAM EXT AA TID PRF PAIN 03/29/16   Historical Provider, MD  diphenhydrAMINE (BENADRYL) 25 mg capsule Take 25 mg by mouth once. One hour Prior to infusion    Historical Provider, MD  Fexofenadine HCl (ALLEGRA PO) Take 1 tablet by mouth at bedtime.     Historical Provider, MD  fluocinonide ointment (LIDEX) 0.05 %  08/20/14   Historical Provider, MD  furosemide (LASIX) 20 MG tablet Take 1 tablet (20 mg total) by mouth daily. 04/24/16   Binnie Rail, MD  hydroxychloroquine (PLAQUENIL) 200 MG tablet Take 200 mg by mouth daily. 05/07/16   Historical Provider, MD  inFLIXimab (REMICADE) 100 MG injection Inject 5 mg/kg into the vein. Infuse 300mg  / 3vials in 234mL NS over 2 hours every 5 weeks    Bo Merino, MD  methylPREDNISolone sodium succinate (SOLU-MEDROL) 40 mg/mL injection Inject 40 mg into the vein once. Prior to infusion    Historical Provider, MD  Multiple Vitamins-Minerals (VISION-VITE PRESERVE PO) Take by mouth.    Historical Provider, MD  predniSONE (DELTASONE) 10 MG tablet TK 1 T PO QD 03/27/16   Historical Provider, MD  ranitidine (ZANTAC) 150 MG tablet Take 1 tablet (150 mg total) by mouth at bedtime. --- office visit needed for further refills. 11/13/16   Binnie Rail, MD  traMADol (ULTRAM) 50 MG tablet Take 50 mg by mouth every 6 (six) hours as needed.    Historical Provider, MD    FAMILY HISTORY: Family History  Problem Relation Age of Onset  . Breast cancer Mother 55  . Hypertension Mother   . Gout Mother   . Hypertension Father   . Heart attack Father 57  . Stroke Sister     doing well  . Diabetes Maternal Aunt   . Hypertension Son   . Stroke Maternal Grandmother     late 76s    SOCIAL HISTORY:  reports that she has never smoked. She has never used smokeless tobacco. She reports that she drinks alcohol. She reports that she does not use drugs.  PHYSICAL EXAM: Blood pressure 109/66, pulse 97, temperature 98.9 F (37.2 C), temperature source Oral, resp. rate 16,  height 5' (1.524 m), weight 59.4 kg (131 lb), SpO2 100 %.  General appearance :Awake, alert, not in any distress. Speech Clear. Not toxic Looking Eyes:, pupils equally reactive to light and accomodation,no scleral icterus.Pink conjunctiva HEENT: Atraumatic and Normocephalic Neck: supple, no JVD. No cervical lymphadenopathy. No thyromegaly Resp:Good air entry bilaterally, no added sounds  CVS: S1 S2 regular, no murmurs.  GI: Bowel sounds present, Non tender and not distended with no gaurding, rigidity or rebound.No organomegaly Extremities: B/L Lower Ext shows no edema, both legs are warm to touch Neurology:  speech clear,Non focal, sensation is grossly intact. Psychiatric: Normal judgment and insight. Alert and oriented x 3. Normal mood. Musculoskeletal:gait appears to be normal.No digital cyanosis Skin:No Rash,  warm and dry Wounds:N/A  LABS ON ADMISSION:  I have personally reviewed following labs and imaging studies  CBC:  Recent Labs Lab 12/09/16 0524  WBC 19.6*  NEUTROABS 17.4*  HGB 14.5  HCT 45.8  MCV 97.0  PLT [id number]    Basic Metabolic Panel:  Recent Labs Lab 12/09/16 0524  NA 142  K 3.7  CL 106  CO2 23  GLUCOSE 111*  BUN 47*  CREATININE 1.57*  CALCIUM 9.0    GFR: Estimated Creatinine Clearance: 21.9 mL/min (by C-G formula based on SCr of 1.57 mg/dL (H)).  Liver Function Tests:  Recent Labs Lab 12/09/16 0524  AST 23  ALT 16  ALKPHOS 53  BILITOT 0.6  PROT 6.6  ALBUMIN 3.4*   No results for input(s): LIPASE, AMYLASE in the last 168 hours. No results for input(s): AMMONIA in the last 168 hours.  Coagulation Profile: No results for input(s): INR, PROTIME in the last 168 hours.  Cardiac Enzymes: No results for input(s): CKTOTAL, CKMB, CKMBINDEX, TROPONINI in the last 168 hours.  BNP (last 3 results) No results for input(s): PROBNP in the last 8760 hours.  HbA1C: No results for input(s): HGBA1C in the last 72 hours.  CBG: No results for  input(s): GLUCAP in the last 168 hours.  Lipid Profile: No results for input(s): CHOL, HDL, LDLCALC, TRIG, CHOLHDL, LDLDIRECT in the last 72 hours.  Thyroid Function Tests: No results for input(s): TSH, T4TOTAL, FREET4, T3FREE, THYROIDAB in the last 72 hours.  Anemia Panel: No results for input(s): VITAMINB12, FOLATE, FERRITIN, TIBC, IRON, RETICCTPCT in the last 72 hours.  Urine analysis:    Component Value Date/Time   COLORURINE YELLOW 12/09/2016 0650   APPEARANCEUR TURBID (A) 12/09/2016 0650   LABSPEC 1.020 12/09/2016 0650   PHURINE 7.0 12/09/2016 0650   GLUCOSEU NEGATIVE 12/09/2016 0650   HGBUR MODERATE (A) 12/09/2016 0650   HGBUR negative 02/25/2008 0837   BILIRUBINUR NEGATIVE 12/09/2016 0650   BILIRUBINUR negative 03/24/2014 1049   KETONESUR NEGATIVE 12/09/2016 0650   PROTEINUR 100 (A) 12/09/2016 0650   UROBILINOGEN 0.2 03/24/2014 1049   UROBILINOGEN negative 02/25/2008 0837   NITRITE POSITIVE (A) 12/09/2016 0650   LEUKOCYTESUR MODERATE (A) 12/09/2016 0650    Sepsis Labs: Lactic Acid, Venous    Component Value Date/Time   LATICACIDVEN 2.9 ([place]) 12/09/2016 0557     Microbiology: No results found for this or any previous visit (from the past 240 hour(s)).    RADIOLOGIC STUDIES ON ADMISSION: No results found.   EKG:  Not done  ASSESSMENT AND PLAN: Present on Admission: . Systemic inflammatory response syndrome likely secondary to Gastroenteritis: Appears nontoxic appearing-we will continue Rocephin, add Flagyl, continue IV fluid hydration and admitted to a MedSurg unit. Although she could have a viral gastroenteritis, she is an immunocompromised-hence will be covered with both Rocephin and Flagyl. Stool C. difficile PCR, GI pathogen panel have been ordered and will need to be followed. Note-patient's blood pressure appears to be soft-she is on chronic prednisone-for now I will place her on IV hydrocortisone, I suspect that this can be switched to oral prednisone  in the next 24 hours.  Marland Kitchen AKI (acute kidney injury): Mild prerenal azotemia due to hemodynamic mediated acute kidney injury in a setting of diarrhea/vomiting and ACE inhibitor use. Hold ACE inhibitor, hydrate and recheck electrolytes in the morning.  . Asymptomatic bacteriuria: Patient's symptoms are mostly compatible with gastroenteritis rather than UTI, in any event she will be covered with Rocephin.  . Rheumatoid arthritis:  On biologic agents (monthly infusions), Plaquenil and chronic prednisone 20 mg daily. See above regarding steroids-continue Plaquenil-old biologic agents. She has intractable pain in her knees-continue tramadol, use IV morphine for breakthrough pain.  . Primary osteoarthritis of both knees: Has significant pain in her knees-at baseline-see above regarding pain  . GERD (gastroesophageal reflux disease): Continue Pepcid  . Essential hypertension: Blood pressure soft-hold ACE inhibitor-see above regarding IV steroids  . Leg edema: Has bilateral chronic lower extremity edema- follow  . Allergic rhinitis: Continue Claritin  Further plan will depend as patient's clinical course evolves and further radiologic and laboratory data become available. Patient will be monitored closely.  Above noted plan was discussed with patient/spouse face to face at bedside, they were in agreement.   CONSULTS: None  DVT Prophylaxis: Prophylactic Lovenox   Code Status: Full Code  Disposition Plan:  Discharge back home in 1-2 days, depending on clinical course  Admission status: Inpatient going to medical floor  Total time spent  55 minutes.Greater than 50% of this time was spent in counseling, explanation of diagnosis, planning of further management, and coordination of care.  [facility] Pager [phone]  If 7PM-7AM, please contact night-coverage [email or web address] Password [email or web address] 12/09/2016, 8:16 AM

## 2016-12-09 NOTE — ED Triage Notes (Signed)
Brought by ems from home.  Reports having n/v/d since 2100 last night.  Reports atleast 10 episodes of each.  Hx of mobility issues but worse this morning than usual from being weak.  500cc NS bolus and zofran 4mg  given en route.  CBG-135.

## 2016-12-10 DIAGNOSIS — J301 Allergic rhinitis due to pollen: Secondary | ICD-10-CM

## 2016-12-10 LAB — GASTROINTESTINAL PANEL BY PCR, STOOL (REPLACES STOOL CULTURE)
ADENOVIRUS F40/41: NOT DETECTED
ASTROVIRUS: NOT DETECTED
CAMPYLOBACTER SPECIES: NOT DETECTED
CYCLOSPORA CAYETANENSIS: NOT DETECTED
Cryptosporidium: NOT DETECTED
ENTEROPATHOGENIC E COLI (EPEC): NOT DETECTED
ENTEROTOXIGENIC E COLI (ETEC): NOT DETECTED
Entamoeba histolytica: NOT DETECTED
Enteroaggregative E coli (EAEC): NOT DETECTED
Giardia lamblia: NOT DETECTED
NOROVIRUS GI/GII: DETECTED — AB
PLESIMONAS SHIGELLOIDES: NOT DETECTED
Rotavirus A: NOT DETECTED
Salmonella species: NOT DETECTED
Sapovirus (I, II, IV, and V): NOT DETECTED
Shiga like toxin producing E coli (STEC): NOT DETECTED
Shigella/Enteroinvasive E coli (EIEC): NOT DETECTED
VIBRIO CHOLERAE: NOT DETECTED
Vibrio species: NOT DETECTED
Yersinia enterocolitica: NOT DETECTED

## 2016-12-10 LAB — CBC
HCT: 39.1 % (ref 36.0–46.0)
Hemoglobin: 11.8 g/dL — ABNORMAL LOW (ref 12.0–15.0)
MCH: 29.7 pg (ref 26.0–34.0)
MCHC: 30.2 g/dL (ref 30.0–36.0)
MCV: 98.5 fL (ref 78.0–100.0)
PLATELETS: 189 10*3/uL (ref 150–400)
RBC: 3.97 MIL/uL (ref 3.87–5.11)
RDW: 15.2 % (ref 11.5–15.5)
WBC: 9.5 10*3/uL (ref 4.0–10.5)

## 2016-12-10 LAB — BASIC METABOLIC PANEL
Anion gap: 3 — ABNORMAL LOW (ref 5–15)
BUN: 26 mg/dL — ABNORMAL HIGH (ref 6–20)
CALCIUM: 8.7 mg/dL — AB (ref 8.9–10.3)
CO2: 24 mmol/L (ref 22–32)
CREATININE: 1.1 mg/dL — AB (ref 0.44–1.00)
Chloride: 117 mmol/L — ABNORMAL HIGH (ref 101–111)
GFR calc non Af Amer: 45 mL/min — ABNORMAL LOW (ref >60)
GFR, EST AFRICAN AMERICAN: 52 mL/min — AB (ref >60)
Glucose, Bld: 92 mg/dL (ref 65–99)
Potassium: 3.1 mmol/L — ABNORMAL LOW (ref 3.5–5.1)
SODIUM: 144 mmol/L (ref 135–145)

## 2016-12-10 MED ORDER — PREDNISONE 20 MG PO TABS
20.0000 mg | ORAL_TABLET | Freq: Every day | ORAL | Status: DC
  Administered 2016-12-11 – 2016-12-12 (×2): 20 mg via ORAL
  Filled 2016-12-10 (×2): qty 1

## 2016-12-10 MED ORDER — POTASSIUM CHLORIDE CRYS ER 20 MEQ PO TBCR
40.0000 meq | EXTENDED_RELEASE_TABLET | Freq: Once | ORAL | Status: AC
Start: 2016-12-10 — End: 2016-12-10
  Administered 2016-12-10: 40 meq via ORAL
  Filled 2016-12-10: qty 2

## 2016-12-10 NOTE — Progress Notes (Signed)
PROGRESS NOTE        PATIENT DETAILS Name: Gloria Warren Age: 83 y.o. Sex: female Date of Birth: 06/09/1931 Admit Date: 12/09/2016 Admitting Physician Evalee Mutton Kristeen Mans, MD [id number] J Burns, MD  Brief Narrative: Patient is a 83 y.o. female with medical history significant of rheumatoid arthritis on chronic prednisone (20 mg daily) and biological infusions monthly (previously on Remicade infusion-and apparently just started a new biologic last week) presented to the ED for evaluation of nausea, vomiting and diarrhea.  Subjective: Diarrhea has improved and slowed down quite a lot. No further vomiting.  Assessment/Plan: Systemic inflammatory response syndrome secondary to colitis: SIRS pathophysiology has resolved, stool C. difficile PCR positive but toxin negative. Although no antibiotic exposure, patient is severely immunocompromised. Stool GI pathogen panel pending. Since diarrhea continues, would continue oral vancomycin as presumed C. Difficile colitis-until other culture data/GI pathogen panel is available. We will stop Rocephin and Flagyl for now.  Acute kidney injury: Secondary to GI loss, significantly improved, creatinine close to usual baseline  Hypokalemia: Replete and recheck. Likely secondary to GI loss  Asymptomatic bacteriuria: Her symptoms are mostly suggestive of GI infection rather than urinary infection-monitor off antibiotics (did receive Rocephin x 2 doses).  History of rheumatoid arthritis: Stop hydrocortisone as blood pressure now more stable-restart home dosing of prednisone. Continue Plaquenil. Resume biologic agents at the discretion of primary rheumatologists upon discharge.  GERD: Continue PPI  Hypertension: Blood pressure controlled-continue to hold ACE inhibitor-  Primary osteoarthritis of both knees: Continue as needed tramadol and other supportive measures.  Chronic leg edema: Has chronic bilateral lower extremity edema  that appears stable-stop IV fluids  Allergic rhinitis: Continue Claritin  DVT Prophylaxis: Prophylactic Lovenox   Code Status: Full code   Family Communication: None at bedside  Disposition Plan: Remain inpatient-home in 1-2 days-once diarrhea better  Antimicrobial agents: Anti-infectives    Start     Dose/Rate Route Frequency Ordered Stop   12/10/16 0700  cefTRIAXone (ROCEPHIN) 2 g in dextrose 5 % 50 mL IVPB     2 g 100 mL/hr over 30 Minutes Intravenous Every 24 hours 12/09/16 0923     12/09/16 2200  vancomycin (VANCOCIN) 50 mg/mL oral solution 125 mg     125 mg Oral 4 times daily 12/09/16 2050 12/23/16 2159   12/09/16 1000  hydroxychloroquine (PLAQUENIL) tablet 200 mg     200 mg Oral Daily 12/09/16 0923     12/09/16 1000  cefTRIAXone (ROCEPHIN) 1 g in dextrose 5 % 50 mL IVPB     1 g 100 mL/hr over 30 Minutes Intravenous  Once 12/09/16 0936 12/09/16 1150   12/09/16 0830  metroNIDAZOLE (FLAGYL) IVPB 500 mg  Status:  Discontinued     500 mg 100 mL/hr over 60 Minutes Intravenous Every 8 hours 12/09/16 0809 12/10/16 0834   12/09/16 0715  cefTRIAXone (ROCEPHIN) 1 g in dextrose 5 % 50 mL IVPB     1 g 100 mL/hr over 30 Minutes Intravenous  Once 12/09/16 0706 12/09/16 0753      Procedures: None  CONSULTS:  None  Time spent: 25 minutes-Greater than 50% of this time was spent in counseling, explanation of diagnosis, planning of further management, and coordination of care.  MEDICATIONS: Scheduled Meds: . calcium carbonate  1,250 mg Oral BID WC  . cefTRIAXone (ROCEPHIN)  IV  2 g Intravenous Q24H  .  cholecalciferol  1,000 Units Oral Daily  . enoxaparin (LOVENOX) injection  30 mg Subcutaneous Q24H  . famotidine  20 mg Oral QHS  . hydrocortisone sod succinate (SOLU-CORTEF) inj  50 mg Intravenous Q12H  . hydroxychloroquine  200 mg Oral Daily  . loratadine  10 mg Oral Daily  . vancomycin  125 mg Oral QID   Continuous Infusions: . sodium chloride 100 mL/hr at 12/09/16 2322    PRN Meds:.acetaminophen **OR** acetaminophen, albuterol, morphine injection, ondansetron **OR** ondansetron (ZOFRAN) IV, traMADol   PHYSICAL EXAM: Vital signs: Vitals:   12/09/16 0927 12/09/16 1326 12/09/16 2043 12/10/16 0609  BP: 105/61 (!) 106/56 (!) 127/57 136/62  Pulse: 95 99 94 71  Resp: 15  19 18   Temp: 98.5 F (36.9 C) 99 F (37.2 C) 98.8 F (37.1 C) 98.1 F (36.7 C)  TempSrc: Oral Oral Oral   SpO2: 100% 98% 99% 100%  Weight:      Height:       Filed Weights   12/09/16 0428 12/09/16 0923  Weight: 59.4 kg (131 lb) 59.4 kg (131 lb)   Body mass index is 25.58 kg/m.   General appearance :Awake, alert, not in any distress. Speech Clear. Not toxic Looking Eyes:, pupils equally reactive to light and accomodation,no scleral icterus.Pink conjunctiva HEENT: Atraumatic and Normocephalic Neck: supple, no JVD. No cervical lymphadenopathy. No thyromegaly Resp:Good air entry bilaterally, no added sounds  CVS: S1 S2 regular, no murmurs.  GI: Bowel sounds present, Non tender and not distended with no gaurding, rigidity or rebound.No organomegaly Extremities: B/L Lower Ext shows + edema, both legs are warm to touch Neurology:  speech clear,Non focal, sensation is grossly intact. Psychiatric: Normal judgment and insight. Alert and oriented x 3. Normal mood. Musculoskeletal:No digital cyanosis Skin:No Rash, warm and dry Wounds:N/A  I have personally reviewed following labs and imaging studies  LABORATORY DATA: CBC:  Recent Labs Lab 12/09/16 0524 12/09/16 1109 12/10/16 0531  WBC 19.6* 17.2* 9.5  NEUTROABS 17.4*  --   --   HGB 14.5 13.0 11.8*  HCT 45.8 42.0 39.1  MCV 97.0 97.2 98.5  PLT 281 232 [id number]    Basic Metabolic Panel:  Recent Labs Lab 12/09/16 0524 12/09/16 1109 12/10/16 0531  NA 142  --  144  K 3.7  --  3.1*  CL 106  --  117*  CO2 23  --  24  GLUCOSE 111*  --  92  BUN 47*  --  26*  CREATININE 1.57* 1.37* 1.10*  CALCIUM 9.0  --  8.7*     GFR: Estimated Creatinine Clearance: 31.3 mL/min (by C-G formula based on SCr of 1.1 mg/dL (H)).  Liver Function Tests:  Recent Labs Lab 12/09/16 0524  AST 23  ALT 16  ALKPHOS 53  BILITOT 0.6  PROT 6.6  ALBUMIN 3.4*   No results for input(s): LIPASE, AMYLASE in the last 168 hours. No results for input(s): AMMONIA in the last 168 hours.  Coagulation Profile: No results for input(s): INR, PROTIME in the last 168 hours.  Cardiac Enzymes: No results for input(s): CKTOTAL, CKMB, CKMBINDEX, TROPONINI in the last 168 hours.  BNP (last 3 results) No results for input(s): PROBNP in the last 8760 hours.  HbA1C: No results for input(s): HGBA1C in the last 72 hours.  CBG: No results for input(s): GLUCAP in the last 168 hours.  Lipid Profile: No results for input(s): CHOL, HDL, LDLCALC, TRIG, CHOLHDL, LDLDIRECT in the last 72 hours.  Thyroid Function Tests: No results  for input(s): TSH, T4TOTAL, FREET4, T3FREE, THYROIDAB in the last 72 hours.  Anemia Panel: No results for input(s): VITAMINB12, FOLATE, FERRITIN, TIBC, IRON, RETICCTPCT in the last 72 hours.  Urine analysis:    Component Value Date/Time   COLORURINE YELLOW 12/09/2016 0650   APPEARANCEUR TURBID (A) 12/09/2016 0650   LABSPEC 1.020 12/09/2016 0650   PHURINE 7.0 12/09/2016 0650   GLUCOSEU NEGATIVE 12/09/2016 0650   HGBUR MODERATE (A) 12/09/2016 0650   HGBUR negative 02/25/2008 0837   BILIRUBINUR NEGATIVE 12/09/2016 0650   BILIRUBINUR negative 03/24/2014 1049   KETONESUR NEGATIVE 12/09/2016 0650   PROTEINUR 100 (A) 12/09/2016 0650   UROBILINOGEN 0.2 03/24/2014 1049   UROBILINOGEN negative 02/25/2008 0837   NITRITE POSITIVE (A) 12/09/2016 0650   LEUKOCYTESUR MODERATE (A) 12/09/2016 0650    Sepsis Labs: Lactic Acid, Venous    Component Value Date/Time   LATICACIDVEN 2.0 (HH) 12/09/2016 1109    MICROBIOLOGY: Recent Results (from the past 240 hour(s))  C difficile quick scan w PCR reflex      Status: Abnormal   Collection Time: 12/09/16  8:08 AM  Result Value Ref Range Status   C Diff antigen POSITIVE (A) NEGATIVE Final   C Diff toxin NEGATIVE NEGATIVE Final   C Diff interpretation Results are indeterminate. See PCR results.  Final  Clostridium Difficile by PCR     Status: Abnormal   Collection Time: 12/09/16  8:08 AM  Result Value Ref Range Status   Toxigenic C Difficile by pcr POSITIVE (A) NEGATIVE Final    Comment: Positive for toxigenic C. difficile with little to no toxin production. Only treat if clinical presentation suggests symptomatic illness. CRITICAL RESULT CALLED TO, READ BACK BY AND VERIFIED WITH: C. LITTLE, 12/09/16 AT 2026 BY J FUDESCO     RADIOLOGY STUDIES/RESULTS: No results found.   LOS: 1 day   Oren Binet, MD  [facility] Pager:[phone]  If 7PM-7AM, please contact night-coverage [email or web address] Password [email or web address] 12/10/2016, 10:05 AM

## 2016-12-10 NOTE — Care Management Note (Signed)
Case Management Note  Patient Details  Name: Gloria Warren MRN: [id number] Date of Birth: 06-Nov-1931  Subjective/Objective:                    Action/Plan:  Confirmed face sheet information with patient and her husband at bedside. Discussed home health with patient and husband. At this time they are not interested in home health.   Patient has a transfer chair at home and does not want a rollator at present.  Expected Discharge Date:  12/11/16               Expected Discharge Plan:  Home/Self Care  In-House Referral:     Discharge planning Services  CM Consult  Post Acute Care Choice:  Home Health, Durable Medical Equipment Choice offered to:  Patient, Spouse  DME Arranged:    DME Agency:     HH Arranged:  Patient Refused [place] Agency:     Status of Service:  Completed, signed off  If discussed at [employer] of Stay Meetings, dates discussed:    Additional Comments:  Marilu Favre, RN 12/10/2016, 11:52 AM

## 2016-12-10 NOTE — Evaluation (Signed)
Physical Therapy Evaluation Patient Details Name: Gloria Warren MRN: [id number] DOB: 02-26-1931 Today's Date: 12/10/2016   History of Present Illness  Patient is a 83 y.o. femalewith medical history significant of rheumatoidarthritis on chronic prednisone (20 mg daily) and biological infusions monthly (previously on Remicade infusion-and apparently just started a new biologic last week) presented to the ED for evaluation of nausea, vomiting and diarrhea.  Clinical Impression  Pt admitted with above diagnosis. Pt currently with functional limitations due to the deficits listed below (see PT Problem List). Pt was able to stand at EOB with mod assist for stability as pt with pain and decr strength limiting pt.  Husband present and he states that she mostly uses transport chair and she may be slightly weaker than baseline but that she is close to baseline at present.  He feels that he will be able to continue to take care of pt at home.  Will follow acutely.  Pt will benefit from skilled PT to increase their independence and safety with mobility to allow discharge to the venue listed below.      Follow Up Recommendations Home health PT;Supervision/Assistance - 24 hour    Equipment Recommendations  None recommended by PT (unless pt would like rollator?)    Recommendations for Other Services       Precautions / Restrictions Precautions Precautions: Fall Restrictions Weight Bearing Restrictions: No      Mobility  Bed Mobility Overal bed mobility: Needs Assistance Bed Mobility: Supine to Sit;Sit to Supine     Supine to sit: Min assist Sit to supine: Min guard   General bed mobility comments: Pt took incr time to come to EOB due to pain.  Assisted with pad to scoot hips out.   Transfers Overall transfer level: Needs assistance Equipment used: 1 person hand held assist Transfers: Sit to/from Stand Sit to Stand: Mod assist         General transfer comment: Pt able to stand at  EOB with mod assist for balance with HHA of 1 with pt holding to rail on bed as well due to instability.  Does not stand fully upright with trunk and hip flexed posture.    Ambulation/Gait Ambulation/Gait assistance: Mod assist Ambulation Distance (Feet): 2 Feet Assistive device: 1 person hand held assist (Could not let go of bed rail with other hand) Gait Pattern/deviations: Decreased step length - right;Decreased step length - left;Decreased stride length;Shuffle;Antalgic;Trunk flexed;Wide base of support   Gait velocity interpretation: Below normal speed for age/gender General Gait Details: Pt only able to take 2 steps forward and back due to pain and overall instability and weakness.  Does not walk far at home per pt and husband - ambulates 5-10 feet normally.    Stairs            Wheelchair Mobility    Modified Rankin (Stroke Patients Only)       Balance Overall balance assessment: Needs assistance Sitting-balance support: No upper extremity supported;Feet supported Sitting balance-Leahy Scale: Fair     Standing balance support: Bilateral upper extremity supported;During functional activity Standing balance-Leahy Scale: Poor Standing balance comment: Requires mod assist to maintain balance staning.  Very poor postural stability.               High level balance activites: Backward walking High Level Balance Comments: mod assist for stability             Pertinent Vitals/Pain Pain Assessment: 0-10 Pain Score: 4  Pain Location: abdomen Pain  Descriptors / Indicators: Aching;Sore Pain Intervention(s): Limited activity within patient's tolerance;Monitored during session;Repositioned  VSS  Home Living Family/patient expects to be discharged to:: Private residence Living Arrangements: Spouse/significant other Available Help at Discharge: Family;Available 24 hours/day Type of Home: House Home Access: Ramped entrance     Home Layout: One level Home  Equipment: Toilet riser;Cane - single point;Transport chair;Walker - 2 wheels      Prior Function Level of Independence: Needs assistance   Gait / Transfers Assistance Needed: used transport chair last 2-3 mo and walked 5-10 feet at at time with pt holding furniture or cane.  ADL's / Homemaking Assistance Needed: sponge bathed, husband assist with bathe and dressing  Comments: Pt states RA had caused some decr mobility     Hand Dominance   Dominant Hand: Right    Extremity/Trunk Assessment   Upper Extremity Assessment Upper Extremity Assessment: Defer to OT evaluation    Lower Extremity Assessment Lower Extremity Assessment: RLE deficits/detail;LLE deficits/detail RLE Deficits / Details: grossly 3/5 LLE Deficits / Details: grossly 2+/5 LLE: Unable to fully assess due to pain    Cervical / Trunk Assessment Cervical / Trunk Assessment: Kyphotic  Communication   Communication: No difficulties  Cognition Arousal/Alertness: Awake/alert Behavior During Therapy: WFL for tasks assessed/performed Overall Cognitive Status: Within Functional Limits for tasks assessed                      General Comments General comments (skin integrity, edema, etc.): Joints "creaking" as pt moved.  Pt did have BM in diaper on arrival therefore cleaned and changed pt.  applied cream as pts bottom red.      Exercises General Exercises - Lower Extremity Ankle Circles/Pumps: AROM;Both;5 reps;Supine Long Arc Quad: AROM;5 reps;Both;Seated Heel Slides: AROM;Both;5 reps;Supine   Assessment/Plan    PT Assessment Patient needs continued PT services  PT Problem List Decreased activity tolerance;Decreased balance;Decreased mobility;Decreased knowledge of use of DME;Decreased safety awareness;Decreased knowledge of precautions;Pain;Decreased skin integrity          PT Treatment Interventions DME instruction;Gait training;Functional mobility training;Therapeutic activities;Therapeutic  exercise;Balance training;Patient/family education    PT Goals (Current goals can be found in the Care Plan section)  Acute Rehab PT Goals Patient Stated Goal: to go home PT Goal Formulation: With patient Time For Goal Achievement: 12/17/16 Potential to Achieve Goals: Good    Frequency Min 3X/week   Barriers to discharge        Co-evaluation               End of Session Equipment Utilized During Treatment: Gait belt Activity Tolerance: Patient limited by fatigue;Patient limited by pain Patient left: in bed;with call bell/phone within reach;with bed alarm set;with family/visitor present Nurse Communication: Mobility status         Time: 1033-1100 PT Time Calculation (min) (ACUTE ONLY): 27 min   Charges:   PT Evaluation $PT Eval Moderate Complexity: 1 Procedure PT Treatments $Therapeutic Activity: 8-22 mins   PT G Codes:        Denice Paradise 12-24-16, 11:16 AM  Matin Mattioli,PT Acute Rehabilitation [phone] (pager)

## 2016-12-11 DIAGNOSIS — M0579 Rheumatoid arthritis with rheumatoid factor of multiple sites without organ or systems involvement: Secondary | ICD-10-CM

## 2016-12-11 DIAGNOSIS — R6510 Systemic inflammatory response syndrome (SIRS) of non-infectious origin without acute organ dysfunction: Secondary | ICD-10-CM

## 2016-12-11 LAB — URINE CULTURE: Culture: >=100000 — AB

## 2016-12-11 MED ORDER — PHENAZOPYRIDINE HCL 100 MG PO TABS
100.0000 mg | ORAL_TABLET | Freq: Two times a day (BID) | ORAL | Status: AC
  Administered 2016-12-11 – 2016-12-12 (×2): 100 mg via ORAL
  Filled 2016-12-11 (×2): qty 1

## 2016-12-11 MED ORDER — DEXTROSE 5 % IV SOLN
1.0000 g | INTRAVENOUS | Status: DC
  Administered 2016-12-11: 1 g via INTRAVENOUS
  Filled 2016-12-11 (×2): qty 10

## 2016-12-11 NOTE — Progress Notes (Signed)
PROGRESS NOTE        PATIENT DETAILS Name: Gloria Warren Age: 83 y.o. Sex: female Date of Birth: 01-23-1931 Admit Date: 12/09/2016 Admitting Physician Evalee Mutton Kristeen Mans, MD [id number] J Burns, MD  Brief Narrative: Patient is a 83 y.o. female with medical history significant of rheumatoid arthritis on chronic prednisone (20 mg daily) and biological infusions monthly (previously on Remicade infusion-and apparently just started a new biologic last week) presented to the ED for evaluation of nausea, vomiting and diarrhea.  Subjective: Diarrhea continui has improved and slowed down quite a lot. No further vomiting.  Assessment/Plan: Systemic inflammatory response syndrome secondary to colitis: SIRS pathophysiology has resolved-suspect etiology is Norvo virus-however  stool C. difficile PCR positive but toxin negative. Although no antibiotic exposure, patient is severely immunocompromised. Spoke with ID-Dr Comer-likely Norvo virus-but given clinical profile of this patient-and ongoing diarrhea-suggests we complete course or oral Vancomycin.Continue other supportive measures, hopefully diarrhea will improve-enough for patient to go home on 1/31  Acute kidney injury: Secondary to GI loss, significantly improved, creatinine close to usual baseline  Hypokalemia: Replete and recheck. Likely secondary to GI loss  Asymptomatic bacteriuria: Her symptoms are mostly suggestive of GI infection rather than urinary infection-monitor off antibiotics (did receive Rocephin x 2 doses).  History of rheumatoid arthritis: Initially given stresshydrocortisone as blood pressure was soft- now more stable-restarted home dosing of prednisone. Continue Plaquenil. Resume biologic agents at the discretion of primary rheumatologists upon discharge.  GERD: Continue PPI  Hypertension: Blood pressure controlled-continue to hold ACE inhibitor-  Primary osteoarthritis of both knees: Continue as  needed tramadol and other supportive measures.  Chronic leg edema: Has chronic bilateral lower extremity edema that appears stable-stop IV fluids  Allergic rhinitis: Continue Claritin  DVT Prophylaxis: Prophylactic Lovenox   Code Status: Full code   Family Communication: None at bedside  Disposition Plan: Remain inpatient-home hopefully on 1/31-if diarrhea better  Antimicrobial agents: Anti-infectives    Start     Dose/Rate Route Frequency Ordered Stop   12/10/16 0700  cefTRIAXone (ROCEPHIN) 2 g in dextrose 5 % 50 mL IVPB  Status:  Discontinued     2 g 100 mL/hr over 30 Minutes Intravenous Every 24 hours 12/09/16 0923 12/10/16 1015   12/09/16 2200  vancomycin (VANCOCIN) 50 mg/mL oral solution 125 mg     125 mg Oral 4 times daily 12/09/16 2050 12/23/16 2159   12/09/16 1000  hydroxychloroquine (PLAQUENIL) tablet 200 mg     200 mg Oral Daily 12/09/16 0923     12/09/16 1000  cefTRIAXone (ROCEPHIN) 1 g in dextrose 5 % 50 mL IVPB     1 g 100 mL/hr over 30 Minutes Intravenous  Once 12/09/16 0936 12/09/16 1150   12/09/16 0830  metroNIDAZOLE (FLAGYL) IVPB 500 mg  Status:  Discontinued     500 mg 100 mL/hr over 60 Minutes Intravenous Every 8 hours 12/09/16 0809 12/10/16 0834   12/09/16 0715  cefTRIAXone (ROCEPHIN) 1 g in dextrose 5 % 50 mL IVPB     1 g 100 mL/hr over 30 Minutes Intravenous  Once 12/09/16 0706 12/09/16 0753      Procedures: None  CONSULTS:  None  Time spent: 25 minutes-Greater than 50% of this time was spent in counseling, explanation of diagnosis, planning of further management, and coordination of care.  MEDICATIONS: Scheduled Meds: . calcium carbonate  1,250  mg Oral BID WC  . cholecalciferol  1,000 Units Oral Daily  . enoxaparin (LOVENOX) injection  30 mg Subcutaneous Q24H  . famotidine  20 mg Oral QHS  . hydroxychloroquine  200 mg Oral Daily  . loratadine  10 mg Oral Daily  . predniSONE  20 mg Oral Q breakfast  . vancomycin  125 mg Oral QID    Continuous Infusions:  PRN Meds:.acetaminophen **OR** acetaminophen, albuterol, morphine injection, ondansetron **OR** ondansetron (ZOFRAN) IV, traMADol   PHYSICAL EXAM: Vital signs: Vitals:   12/10/16 0609 12/10/16 1300 12/10/16 2021 12/11/16 0616  BP: 136/62 138/68 (!) 134/58 (!) 139/56  Pulse: 71 71 85 77  Resp: 18  18 17   Temp: 98.1 F (36.7 C) 98.2 F (36.8 C) 99.1 F (37.3 C) 98.4 F (36.9 C)  TempSrc:  Oral Oral Oral  SpO2: 100%  98% 100%  Weight:      Height:       Filed Weights   12/09/16 0428 12/09/16 0923  Weight: 59.4 kg (131 lb) 59.4 kg (131 lb)   Body mass index is 25.58 kg/m.   General appearance :Awake, alert, not in any distress. Speech Clear. Not toxic Looking Eyes:, pupils equally reactive to light and accomodation,no scleral icterus.Pink conjunctiva HEENT: Atraumatic and Normocephalic Neck: supple, no JVD. No cervical lymphadenopathy. No thyromegaly Resp:Good air entry bilaterally, no added sounds  CVS: S1 S2 regular, no murmurs.  GI: Bowel sounds present, Non tender and not distended with no gaurding, rigidity or rebound.No organomegaly Extremities: B/L Lower Ext shows + edema, both legs are warm to touch Neurology:  speech clear,Non focal, sensation is grossly intact. Psychiatric: Normal judgment and insight. Alert and oriented x 3. Normal mood. Musculoskeletal:No digital cyanosis Skin:No Rash, warm and dry Wounds:N/A  I have personally reviewed following labs and imaging studies  LABORATORY DATA: CBC:  Recent Labs Lab 12/09/16 0524 12/09/16 1109 12/10/16 0531  WBC 19.6* 17.2* 9.5  NEUTROABS 17.4*  --   --   HGB 14.5 13.0 11.8*  HCT 45.8 42.0 39.1  MCV 97.0 97.2 98.5  PLT 281 232 [id number]    Basic Metabolic Panel:  Recent Labs Lab 12/09/16 0524 12/09/16 1109 12/10/16 0531  NA 142  --  144  K 3.7  --  3.1*  CL 106  --  117*  CO2 23  --  24  GLUCOSE 111*  --  92  BUN 47*  --  26*  CREATININE 1.57* 1.37* 1.10*  CALCIUM 9.0   --  8.7*    GFR: Estimated Creatinine Clearance: 31.3 mL/min (by C-G formula based on SCr of 1.1 mg/dL (H)).  Liver Function Tests:  Recent Labs Lab 12/09/16 0524  AST 23  ALT 16  ALKPHOS 53  BILITOT 0.6  PROT 6.6  ALBUMIN 3.4*   No results for input(s): LIPASE, AMYLASE in the last 168 hours. No results for input(s): AMMONIA in the last 168 hours.  Coagulation Profile: No results for input(s): INR, PROTIME in the last 168 hours.  Cardiac Enzymes: No results for input(s): CKTOTAL, CKMB, CKMBINDEX, TROPONINI in the last 168 hours.  BNP (last 3 results) No results for input(s): PROBNP in the last 8760 hours.  HbA1C: No results for input(s): HGBA1C in the last 72 hours.  CBG: No results for input(s): GLUCAP in the last 168 hours.  Lipid Profile: No results for input(s): CHOL, HDL, LDLCALC, TRIG, CHOLHDL, LDLDIRECT in the last 72 hours.  Thyroid Function Tests: No results for input(s): TSH, T4TOTAL, FREET4, T3FREE,  THYROIDAB in the last 72 hours.  Anemia Panel: No results for input(s): VITAMINB12, FOLATE, FERRITIN, TIBC, IRON, RETICCTPCT in the last 72 hours.  Urine analysis:    Component Value Date/Time   COLORURINE YELLOW 12/09/2016 0650   APPEARANCEUR TURBID (A) 12/09/2016 0650   LABSPEC 1.020 12/09/2016 0650   PHURINE 7.0 12/09/2016 0650   GLUCOSEU NEGATIVE 12/09/2016 0650   HGBUR MODERATE (A) 12/09/2016 0650   HGBUR negative 02/25/2008 0837   BILIRUBINUR NEGATIVE 12/09/2016 0650   BILIRUBINUR negative 03/24/2014 1049   KETONESUR NEGATIVE 12/09/2016 0650   PROTEINUR 100 (A) 12/09/2016 0650   UROBILINOGEN 0.2 03/24/2014 1049   UROBILINOGEN negative 02/25/2008 0837   NITRITE POSITIVE (A) 12/09/2016 0650   LEUKOCYTESUR MODERATE (A) 12/09/2016 0650    Sepsis Labs: Lactic Acid, Venous    Component Value Date/Time   LATICACIDVEN 2.0 (HH) 12/09/2016 1109    MICROBIOLOGY: Recent Results (from the past 240 hour(s))  Culture, blood (routine x 2)      Status: None (Preliminary result)   Collection Time: 12/09/16  5:49 AM  Result Value Ref Range Status   Specimen Description BLOOD RIGHT ARM  Final   Special Requests BOTTLES DRAWN AEROBIC AND ANAEROBIC 10CC  Final   Culture NO GROWTH 1 DAY  Final   Report Status PENDING  Incomplete  Culture, blood (routine x 2)     Status: None (Preliminary result)   Collection Time: 12/09/16  6:02 AM  Result Value Ref Range Status   Specimen Description BLOOD RIGHT HAND  Final   Special Requests IN PEDIATRIC BOTTLE 4CC  Final   Culture NO GROWTH 1 DAY  Final   Report Status PENDING  Incomplete  Urine culture     Status: Abnormal (Preliminary result)   Collection Time: 12/09/16  6:50 AM  Result Value Ref Range Status   Specimen Description URINE, RANDOM  Final   Special Requests NONE  Final   Culture >=100,000 COLONIES/mL PROTEUS SPECIES (A)  Final   Report Status PENDING  Incomplete  Gastrointestinal Panel by PCR , Stool     Status: Abnormal   Collection Time: 12/09/16  7:50 AM  Result Value Ref Range Status   Campylobacter species NOT DETECTED NOT DETECTED Final   Plesimonas shigelloides NOT DETECTED NOT DETECTED Final   Salmonella species NOT DETECTED NOT DETECTED Final   Yersinia enterocolitica NOT DETECTED NOT DETECTED Final   Vibrio species NOT DETECTED NOT DETECTED Final   Vibrio cholerae NOT DETECTED NOT DETECTED Final   Enteroaggregative E coli (EAEC) NOT DETECTED NOT DETECTED Final   Enteropathogenic E coli (EPEC) NOT DETECTED NOT DETECTED Final   Enterotoxigenic E coli (ETEC) NOT DETECTED NOT DETECTED Final   Shiga like toxin producing E coli (STEC) NOT DETECTED NOT DETECTED Final   Shigella/Enteroinvasive E coli (EIEC) NOT DETECTED NOT DETECTED Final   Cryptosporidium NOT DETECTED NOT DETECTED Final   Cyclospora cayetanensis NOT DETECTED NOT DETECTED Final   Entamoeba histolytica NOT DETECTED NOT DETECTED Final   Giardia lamblia NOT DETECTED NOT DETECTED Final   Adenovirus F40/41  NOT DETECTED NOT DETECTED Final   Astrovirus NOT DETECTED NOT DETECTED Final   Norovirus GI/GII DETECTED (A) NOT DETECTED Final    Comment: RESULT CALLED TO, READ BACK BY AND VERIFIED WITH: DORA AMA 12/10/16 @ 1607  MLK    Rotavirus A NOT DETECTED NOT DETECTED Final   Sapovirus (I, II, IV, and V) NOT DETECTED NOT DETECTED Final  C difficile quick scan w PCR reflex  Status: Abnormal   Collection Time: 12/09/16  8:08 AM  Result Value Ref Range Status   C Diff antigen POSITIVE (A) NEGATIVE Final   C Diff toxin NEGATIVE NEGATIVE Final   C Diff interpretation Results are indeterminate. See PCR results.  Final  Clostridium Difficile by PCR     Status: Abnormal   Collection Time: 12/09/16  8:08 AM  Result Value Ref Range Status   Toxigenic C Difficile by pcr POSITIVE (A) NEGATIVE Final    Comment: Positive for toxigenic C. difficile with little to no toxin production. Only treat if clinical presentation suggests symptomatic illness. CRITICAL RESULT CALLED TO, READ BACK BY AND VERIFIED WITH: C. LITTLE, 12/09/16 AT 2026 BY J FUDESCO     RADIOLOGY STUDIES/RESULTS: No results found.   LOS: 2 days   Oren Binet, MD  [facility] Pager:[phone]  If 7PM-7AM, please contact night-coverage [email or web address] Password [facility] 12/11/2016, 9:39 AM

## 2016-12-12 MED ORDER — VANCOMYCIN 50 MG/ML ORAL SOLUTION: 125.0000 mg | Freq: Four times a day (QID) | ORAL | 0 refills | Status: DC

## 2016-12-12 NOTE — Progress Notes (Signed)
Pt did not have a BM today, no abd pain. Feeling a lot better. Discharge instructions given to pt, verbalized understanding.  Discharged to home accompanied by spouse.

## 2016-12-12 NOTE — Care Management Note (Signed)
Case Management Note  Patient Details  Name: Gloria Warren MRN: [id number] Date of Birth: 18-Apr-1931  Subjective/Objective:                    Action/Plan: Benefit check :  VANCOMYCIN ( VANCOCIN ) 50 MG /ML ORAL SOLUTION 125 MG  DAILY FOR 12 DAYS 4 X A DAY   COVER- YES  CO-PAY- $ 66.84  TIER- 3 DRUG  PRIOR APPROVAL- NO  PHARMACY : [facility]   Expected Discharge Date:  12/11/16               Expected Discharge Plan:  Home/Self Care  In-House Referral:     Discharge planning Services  CM Consult  Post Acute Care Choice:  Home Health, Durable Medical Equipment Choice offered to:  Patient, Spouse  DME Arranged:    DME Agency:     HH Arranged:  Patient Refused [place] Agency:     Status of Service:  Completed, signed off  If discussed at [employer], dates discussed:    Additional Comments:  Marilu Favre, RN 12/12/2016, 8:33 AM

## 2016-12-12 NOTE — Progress Notes (Addendum)
Physical Therapy Treatment Patient Details Name: Gloria Warren MRN: [id number] DOB: Sep 19, 1931 Today's Date: 12/12/2016    History of Present Illness Patient is a 83 y.o. femalewith medical history significant of rheumatoidarthritis on chronic prednisone (20 mg daily) and biological infusions monthly (previously on Remicade infusion-and apparently just started a new biologic last week) presented to the ED for evaluation of nausea, vomiting and diarrhea.    PT Comments    Pt admitted with above diagnosis. Pt currently with functional limitations due to balance and endurance deficits. Pt was able to ambulate around bed with improved strength since last seen.  Going home today and pt and husband are comfortable with pts progress.  Husband does want HH f/u so pt can improve her strength and endurance.  Pt unsure but considering it.  Pt will benefit from skilled PT to increase their independence and safety with mobility to allow discharge to the venue listed below.    Follow Up Recommendations  Home health PT;Supervision/Assistance - 24 hour     Equipment Recommendations  None recommended by PT    Recommendations for Other Services       Precautions / Restrictions Precautions Precautions: Fall Restrictions Weight Bearing Restrictions: No    Mobility  Bed Mobility Overal bed mobility: Needs Assistance Bed Mobility: Supine to Sit;Sit to Supine     Supine to sit: Supervision     General bed mobility comments: Pt took incr time to come to EOB but did without help.   Transfers Overall transfer level: Needs assistance Equipment used: Rolling walker (2 wheeled) Transfers: Sit to/from Stand Sit to Stand: Min assist;+2 physical assistance         General transfer comment: Pt able to stand at EOB with min assist for balance with RW.  Able to  stand fully upright with trunk and hip flexed posture slightly which pt and husband state are baseline.  No steadying assist needed once  up.     Ambulation/Gait Ambulation/Gait assistance: Mod assist;+2 safety/equipment Ambulation Distance (Feet): 20 Feet Assistive device: Rolling walker (2 wheeled) Gait Pattern/deviations: Decreased step length - right;Decreased step length - left;Decreased stride length;Shuffle;Antalgic;Trunk flexed;Wide base of support   Gait velocity interpretation: Below normal speed for age/gender General Gait Details: Pt was able to walk around bed with RW.  Pt does show signs of fatigue after only walking 15 feet needing mod assist due to instability and weakness as she fatigued with posterior lean.  Does not walk far at home per pt and husband - ambulates 5-10 feet normally.  Husband does express desire for pt to walk more in the halls at home to incr her stamina.       Stairs            Wheelchair Mobility    Modified Rankin (Stroke Patients Only)       Balance Overall balance assessment: Needs assistance Sitting-balance support: No upper extremity supported;Feet supported Sitting balance-Leahy Scale: Fair     Standing balance support: Bilateral upper extremity supported;During functional activity Standing balance-Leahy Scale: Poor Standing balance comment: Requires min assist to maintain balance static standing.  Postural stability is improving.               High level balance activites: Direction changes;Turns;Backward walking High Level Balance Comments: mod assist for stability as she fatigued.    Cognition Arousal/Alertness: Awake/alert Behavior During Therapy: WFL for tasks assessed/performed Overall Cognitive Status: Within Functional Limits for tasks assessed  Exercises      General Comments General comments (skin integrity, edema, etc.): Joints "creaking" as pt moved.        Pertinent Vitals/Pain Pain Assessment: No/denies pain  VSS  Home Living                      Prior Function            PT Goals (current  goals can now be found in the care plan section) Acute Rehab PT Goals Patient Stated Goal: to go home Progress towards PT goals: Progressing toward goals    Frequency    Min 3X/week      PT Plan Current plan remains appropriate    Co-evaluation             End of Session Equipment Utilized During Treatment: Gait belt Activity Tolerance: Patient limited by fatigue Patient left: in bed;with call bell/phone within reach;with family/visitor present     Time: [id number] PT Time Calculation (min) (ACUTE ONLY): 15 min  Charges:  $Gait Training: 8-22 mins                    G Codes:      Godfrey Pick Jaydan Meidinger Dec 30, 2016, 11:06 AM Amanda [facility] Acute Rehabilitation [phone] (pager)

## 2016-12-12 NOTE — Progress Notes (Addendum)
Feels much better today No bowel movement since yesterday evening She denies any dysuria Long discussion with patient-explained that she probably had norvo virus, but also had positive C. difficile PCR-at this time she wants to continue oral vancomycin. Stable for discharge-see discharge summary for details

## 2016-12-12 NOTE — Discharge Summary (Signed)
PATIENT DETAILS Name: Gloria Warren Age: 83 y.o. Sex: female Date of Birth: 01-01-1931 MRN: [id number]. Admitting Physician: Jonetta Osgood, MD [id number] J Burns, MD  Admit Date: 12/09/2016 Discharge date: 12/12/2016  Recommendations for Outpatient Follow-up:  1. Follow up with PCP in 1-2 weeks 2. Please obtain BMP/CBC in one week 3. Please follow up on the following pending results:  Admitted From:  Home  Disposition: Home (refused HHPT)   Home Health: No  Equipment/Devices: None  Discharge Condition: Stable  CODE STATUS: FULL CODE  Diet recommendation:  Heart Healthy   Brief Summary: See H&P, Labs, Consult and Test reports for all details in brief-Patient is a 83 y.o. femalewith medical history significant of rheumatoidarthritis on chronic prednisone (20 mg daily) and biological infusions monthly (previously on Remicade infusion-and apparently just started a new biologic last week) presented to the ED for evaluation of nausea, vomiting and diarrhea. Patient was subsequently admitted for further evaluation and treatment  Brief Hospital Course: Systemic inflammatory response syndrome secondary to colitis: SIRS pathophysiology has resolved-suspect etiology is Norvo virus-however  stool C. difficile PCR positive but toxin negative. Although no antibiotic exposure, patient is severely immunocompromised. Spoke with ID-Dr Comer-likely Norvo virus-but given clinical profile of this patient-and ongoing diarrhea-suggests we complete course or oral Vancomycin.subsequently have spoken with the patient, she also at this time does desire to continue treatment with oral vancomycin. Her diarrhea has significantly improved-in fact she has not had a bowel movement since last evening. She is stable for discharge today.   Acute kidney injury: Secondary to GI loss, significantly improved, creatinine close to usual baseline  Hypokalemia: Replete and recheck. Likely secondary to  GI loss  Asymptomatic bacteriuria: Her symptoms are mostly suggestive of GI infection rather than urinary infection-monitor off antibiotics (did receive Rocephin x 3 doses). Her urine culture was positive for Proteus that was sensitive to Rocephin  History of rheumatoid arthritis: Initially given stress hydrocortisone as blood pressure was soft- now more stable-restarted home dosing of prednisone. Continue Plaquenil. Resume biologic agents at the discretion of primary rheumatologists upon discharge.  GERD: Continue PPI  Hypertension: Blood pressure resume ACE inhibitor on discharge  Primary osteoarthritis of both knees: Continue as needed tramadol and other supportive measures.  Chronic leg edema: Has chronic bilateral lower extremity edema that appears stable  Allergic rhinitis: Continue usual dosing of antihistamine  Procedures/Studies: None  Discharge Diagnoses:  Active Problems:   Essential hypertension   Allergic rhinitis   Rheumatoid arthritis (HCC)   Leg edema   GERD (gastroesophageal reflux disease)   Primary osteoarthritis of both knees   Gastroenteritis   SIRS (systemic inflammatory response syndrome) (HCC)   AKI (acute kidney injury) [facility]   Discharge Instructions:  Activity:  As tolerated with Full fall precautions use walker/cane & assistance as needed  Discharge Instructions    Call MD for:  persistant nausea and vomiting    Complete by:  As directed    Call MD for:  severe uncontrolled pain    Complete by:  As directed    Diet - low sodium heart healthy    Complete by:  As directed    Increase activity slowly    Complete by:  As directed      Allergies as of 12/12/2016      Reactions   Arava [leflunomide]    Liver & kidney dysfunction   Sulfonamide Derivatives    Itching & rash   Remicade [infliximab] Hives, Itching  Medication List    TAKE these medications   acetaminophen 325 MG tablet Commonly known as:  TYLENOL Take 650 mg by  mouth every 6 (six) hours as needed for mild pain.   ALLEGRA PO Take 180 mg by mouth at bedtime.   benazepril 10 MG tablet Commonly known as:  LOTENSIN TAKE 1 TABLET(10 MG) BY MOUTH DAILY   CALTRATE 600 PO Take 600 mg by mouth 2 (two) times daily.   chlorhexidine 0.12 % solution Commonly known as:  PERIDEX 1 mL by Mouth Rinse route daily.   cholecalciferol 1000 units tablet Commonly known as:  VITAMIN D Take 1,000 Units by mouth daily.   CIMZIA [place] Inject 1 each into the skin every 14 (fourteen) days. Pt's on a taper dose. ( every 2 weeks till patient get to 1 injection every monthO   diclofenac sodium 1 % Gel Commonly known as:  VOLTAREN APP 1 GRAM EXT AA TID PRF PAIN   diphenhydrAMINE 25 mg capsule Commonly known as:  BENADRYL Take 25 mg by mouth once. One hour Prior to infusion   fluocinonide ointment 0.05 % Commonly known as:  LIDEX   furosemide 20 MG tablet Commonly known as:  LASIX Take 1 tablet (20 mg total) by mouth daily.   hydroxychloroquine 200 MG tablet Commonly known as:  PLAQUENIL Take 200 mg by mouth 2 (two) times daily.   predniSONE 20 MG tablet Commonly known as:  DELTASONE Take 20 mg by mouth daily with breakfast.   ranitidine 150 MG tablet Commonly known as:  ZANTAC Take 1 tablet (150 mg total) by mouth at bedtime. --- office visit needed for further refills.   vancomycin 50 mg/mL oral solution Commonly known as:  VANCOCIN Take 2.5 mLs (125 mg total) by mouth 4 (four) times daily.   VISION-VITE PRESERVE PO Take by mouth.      Follow-up Information    Binnie Rail, MD. Schedule an appointment as soon as possible for a visit in 1 week(s).   Specialty:  Internal Medicine Contact information: [place] [id number] [phone]          Allergies  Allergen Reactions  . Arava [Leflunomide]     Liver & kidney dysfunction  . Sulfonamide Derivatives     Itching & rash  . Remicade [Infliximab] Hives and Itching    Consultations:   None   Other Procedures/Studies:  No results found.   TODAY-DAY OF DISCHARGE:  Subjective:   Gloria Warren today has no headache,no chest abdominal pain,no new weakness tingling or numbness, feels much better wants to go home today.   Objective:   Blood pressure 125/72, pulse 78, temperature 97.3 F (36.3 C), temperature source Oral, resp. rate 17, height 5' (1.524 m), weight 59.4 kg (131 lb), SpO2 99 %.  Intake/Output Summary (Last 24 hours) at 12/12/16 1117 Last data filed at 12/12/16 0944  Gross per 24 hour  Intake              681 ml  Output              200 ml  Net              481 ml   Filed Weights   12/09/16 0428 12/09/16 0923  Weight: 59.4 kg (131 lb) 59.4 kg (131 lb)    Exam: Awake Alert, Oriented *3, No new F.N deficits, Normal affect [place].AT,PERRAL Supple Neck,No JVD, No cervical lymphadenopathy appriciated.  Symmetrical Chest wall movement, Good  air movement bilaterally, CTAB RRR,No Gallops,Rubs or new Murmurs, No Parasternal Heave +ve B.Sounds, Abd Soft, Non tender, No organomegaly appriciated, No rebound -guarding or rigidity. No Cyanosis, Clubbing or edema, No new Rash or bruise   PERTINENT RADIOLOGIC STUDIES: No results found.   PERTINENT LAB RESULTS: CBC:  Recent Labs  12/10/16 0531  WBC 9.5  HGB 11.8*  HCT 39.1  PLT 189   CMET CMP     Component Value Date/Time   NA 144 12/10/2016 0531   K 3.1 (L) 12/10/2016 0531   CL 117 (H) 12/10/2016 0531   CO2 24 12/10/2016 0531   GLUCOSE 92 12/10/2016 0531   BUN 26 (H) 12/10/2016 0531   CREATININE 1.10 (H) 12/10/2016 0531   CALCIUM 8.7 (L) 12/10/2016 0531   PROT 6.6 12/09/2016 0524   ALBUMIN 3.4 (L) 12/09/2016 0524   AST 23 12/09/2016 0524   ALT 16 12/09/2016 0524   ALKPHOS 53 12/09/2016 0524   BILITOT 0.6 12/09/2016 0524   GFRNONAA 45 (L) 12/10/2016 0531   GFRAA 52 (L) 12/10/2016 0531    GFR Estimated Creatinine Clearance: 31.3 mL/min (by C-G formula based on  SCr of 1.1 mg/dL (H)). No results for input(s): LIPASE, AMYLASE in the last 72 hours. No results for input(s): CKTOTAL, CKMB, CKMBINDEX, TROPONINI in the last 72 hours. Invalid input(s): POCBNP No results for input(s): DDIMER in the last 72 hours. No results for input(s): HGBA1C in the last 72 hours. No results for input(s): CHOL, HDL, LDLCALC, TRIG, CHOLHDL, LDLDIRECT in the last 72 hours. No results for input(s): TSH, T4TOTAL, T3FREE, THYROIDAB in the last 72 hours.  Invalid input(s): FREET3 No results for input(s): VITAMINB12, FOLATE, FERRITIN, TIBC, IRON, RETICCTPCT in the last 72 hours. Coags: No results for input(s): INR in the last 72 hours.  Invalid input(s): PT Microbiology: Recent Results (from the past 240 hour(s))  Culture, blood (routine x 2)     Status: None (Preliminary result)   Collection Time: 12/09/16  5:49 AM  Result Value Ref Range Status   Specimen Description BLOOD RIGHT ARM  Final   Special Requests BOTTLES DRAWN AEROBIC AND ANAEROBIC 10CC  Final   Culture NO GROWTH 2 DAYS  Final   Report Status PENDING  Incomplete  Culture, blood (routine x 2)     Status: None (Preliminary result)   Collection Time: 12/09/16  6:02 AM  Result Value Ref Range Status   Specimen Description BLOOD RIGHT HAND  Final   Special Requests IN PEDIATRIC BOTTLE 4CC  Final   Culture NO GROWTH 2 DAYS  Final   Report Status PENDING  Incomplete  Urine culture     Status: Abnormal   Collection Time: 12/09/16  6:50 AM  Result Value Ref Range Status   Specimen Description URINE, RANDOM  Final   Special Requests NONE  Final   Culture >=100,000 COLONIES/mL PROTEUS SPECIES (A)  Final   Report Status 12/11/2016 FINAL  Final   Organism ID, Bacteria PROTEUS SPECIES (A)  Final      Susceptibility   Proteus species - MIC*    AMPICILLIN >=32 RESISTANT Resistant     CEFAZOLIN >=64 RESISTANT Resistant     CEFTRIAXONE <=1 SENSITIVE Sensitive     CIPROFLOXACIN <=0.25 SENSITIVE Sensitive      GENTAMICIN <=1 SENSITIVE Sensitive     IMIPENEM 4 SENSITIVE Sensitive     NITROFURANTOIN 128 RESISTANT Resistant     TRIMETH/SULFA <=20 SENSITIVE Sensitive     AMPICILLIN/SULBACTAM 16 INTERMEDIATE Intermediate  PIP/TAZO <=4 SENSITIVE Sensitive     * >=100,000 COLONIES/mL PROTEUS SPECIES  Gastrointestinal Panel by PCR , Stool     Status: Abnormal   Collection Time: 12/09/16  7:50 AM  Result Value Ref Range Status   Campylobacter species NOT DETECTED NOT DETECTED Final   Plesimonas shigelloides NOT DETECTED NOT DETECTED Final   Salmonella species NOT DETECTED NOT DETECTED Final   Yersinia enterocolitica NOT DETECTED NOT DETECTED Final   Vibrio species NOT DETECTED NOT DETECTED Final   Vibrio cholerae NOT DETECTED NOT DETECTED Final   Enteroaggregative E coli (EAEC) NOT DETECTED NOT DETECTED Final   Enteropathogenic E coli (EPEC) NOT DETECTED NOT DETECTED Final   Enterotoxigenic E coli (ETEC) NOT DETECTED NOT DETECTED Final   Shiga like toxin producing E coli (STEC) NOT DETECTED NOT DETECTED Final   Shigella/Enteroinvasive E coli (EIEC) NOT DETECTED NOT DETECTED Final   Cryptosporidium NOT DETECTED NOT DETECTED Final   Cyclospora cayetanensis NOT DETECTED NOT DETECTED Final   Entamoeba histolytica NOT DETECTED NOT DETECTED Final   Giardia lamblia NOT DETECTED NOT DETECTED Final   Adenovirus F40/41 NOT DETECTED NOT DETECTED Final   Astrovirus NOT DETECTED NOT DETECTED Final   Norovirus GI/GII DETECTED (A) NOT DETECTED Final    Comment: RESULT CALLED TO, READ BACK BY AND VERIFIED WITH: DORA AMA 12/10/16 @ 1607  MLK    Rotavirus A NOT DETECTED NOT DETECTED Final   Sapovirus (I, II, IV, and V) NOT DETECTED NOT DETECTED Final  C difficile quick scan w PCR reflex     Status: Abnormal   Collection Time: 12/09/16  8:08 AM  Result Value Ref Range Status   C Diff antigen POSITIVE (A) NEGATIVE Final   C Diff toxin NEGATIVE NEGATIVE Final   C Diff interpretation Results are indeterminate.  See PCR results.  Final  Clostridium Difficile by PCR     Status: Abnormal   Collection Time: 12/09/16  8:08 AM  Result Value Ref Range Status   Toxigenic C Difficile by pcr POSITIVE (A) NEGATIVE Final    Comment: Positive for toxigenic C. difficile with little to no toxin production. Only treat if clinical presentation suggests symptomatic illness. CRITICAL RESULT CALLED TO, READ BACK BY AND VERIFIED WITH: C. LITTLE, 12/09/16 AT 2026 BY J FUDESCO     FURTHER DISCHARGE INSTRUCTIONS:  Get Medicines reviewed and adjusted: Please take all your medications with you for your next visit with your Primary MD  Laboratory/radiological data: Please request your Primary MD to go over all hospital tests and procedure/radiological results at the follow up, please ask your Primary MD to get all Hospital records sent to his/her office.  In some cases, they will be blood work, cultures and biopsy results pending at the time of your discharge. Please request that your primary care M.D. goes through all the records of your hospital data and follows up on these results.  Also Note the following: If you experience worsening of your admission symptoms, develop shortness of breath, life threatening emergency, suicidal or homicidal thoughts you must seek medical attention immediately by calling 911 or calling your MD immediately  if symptoms less severe.  You must read complete instructions/literature along with all the possible adverse reactions/side effects for all the Medicines you take and that have been prescribed to you. Take any new Medicines after you have completely understood and accpet all the possible adverse reactions/side effects.   Do not drive when taking Pain medications or sleeping medications (Benzodaizepines)  Do not  take more than prescribed Pain, Sleep and Anxiety Medications. It is not advisable to combine anxiety,sleep and pain medications without talking with your primary care  practitioner  Special Instructions: If you have smoked or chewed Tobacco  in the last 2 yrs please stop smoking, stop any regular Alcohol  and or any Recreational drug use.  Wear Seat belts while driving.  Please note: You were cared for by a hospitalist during your hospital stay. Once you are discharged, your primary care physician will handle any further medical issues. Please note that NO REFILLS for any discharge medications will be authorized once you are discharged, as it is imperative that you return to your primary care physician (or establish a relationship with a primary care physician if you do not have one) for your post hospital discharge needs so that they can reassess your need for medications and monitor your lab values.  Total Time spent coordinating discharge including counseling, education and face to face time equals  45 minutes.  SignedOren Binet 12/12/2016 11:17 AM

## 2016-12-14 LAB — CULTURE, BLOOD (ROUTINE X 2)
CULTURE: NO GROWTH
Culture: NO GROWTH

## 2017-01-01 DIAGNOSIS — M0579 Rheumatoid arthritis with rheumatoid factor of multiple sites without organ or systems involvement: Secondary | ICD-10-CM | POA: Diagnosis not present

## 2017-01-03 ENCOUNTER — Telehealth: Payer: Self-pay | Admitting: Internal Medicine

## 2017-01-03 NOTE — Telephone Encounter (Signed)
Called patient to schedule awv. Patient stated that she will give office a call to schedule an appt.

## 2017-01-15 DIAGNOSIS — M0579 Rheumatoid arthritis with rheumatoid factor of multiple sites without organ or systems involvement: Secondary | ICD-10-CM | POA: Diagnosis not present

## 2017-01-22 DIAGNOSIS — M150 Primary generalized (osteo)arthritis: Secondary | ICD-10-CM | POA: Diagnosis not present

## 2017-01-22 DIAGNOSIS — Z6825 Body mass index (BMI) 25.0-25.9, adult: Secondary | ICD-10-CM | POA: Diagnosis not present

## 2017-01-22 DIAGNOSIS — M0579 Rheumatoid arthritis with rheumatoid factor of multiple sites without organ or systems involvement: Secondary | ICD-10-CM | POA: Diagnosis not present

## 2017-01-22 LAB — CBC AND DIFFERENTIAL
HEMATOCRIT: 43 % (ref 36–46)
HEMOGLOBIN: 14.2 g/dL (ref 12.0–16.0)
Platelets: 273 10*3/uL (ref 150–399)
WBC: 8.6 10*3/mL

## 2017-01-22 LAB — BASIC METABOLIC PANEL
BUN: 35 mg/dL — AB (ref 4–21)
Creatinine: 1.2 mg/dL — AB (ref 0.5–1.1)
GLUCOSE: 124 mg/dL
Potassium: 4.5 mmol/L (ref 3.4–5.3)
SODIUM: 139 mmol/L (ref 137–147)

## 2017-01-22 LAB — HEPATIC FUNCTION PANEL
ALT: 14 U/L (ref 7–35)
AST: 12 U/L — AB (ref 13–35)
Alkaline Phosphatase: 63 U/L (ref 25–125)
BILIRUBIN, TOTAL: 0.4 mg/dL

## 2017-01-24 ENCOUNTER — Encounter: Payer: Self-pay | Admitting: Internal Medicine

## 2017-02-13 DIAGNOSIS — M0579 Rheumatoid arthritis with rheumatoid factor of multiple sites without organ or systems involvement: Secondary | ICD-10-CM | POA: Diagnosis not present

## 2017-02-14 ENCOUNTER — Other Ambulatory Visit: Payer: Self-pay | Admitting: Internal Medicine

## 2017-02-14 DIAGNOSIS — H524 Presbyopia: Secondary | ICD-10-CM | POA: Diagnosis not present

## 2017-02-14 DIAGNOSIS — Z79899 Other long term (current) drug therapy: Secondary | ICD-10-CM | POA: Diagnosis not present

## 2017-02-14 DIAGNOSIS — H353131 Nonexudative age-related macular degeneration, bilateral, early dry stage: Secondary | ICD-10-CM | POA: Diagnosis not present

## 2017-02-14 DIAGNOSIS — H26493 Other secondary cataract, bilateral: Secondary | ICD-10-CM | POA: Diagnosis not present

## 2017-02-14 DIAGNOSIS — H40013 Open angle with borderline findings, low risk, bilateral: Secondary | ICD-10-CM | POA: Diagnosis not present

## 2017-02-18 ENCOUNTER — Telehealth: Payer: Self-pay | Admitting: Internal Medicine

## 2017-02-18 MED ORDER — RANITIDINE HCL 150 MG PO TABS: 150.0000 mg | ORAL_TABLET | Freq: Every day | ORAL | 0 refills | Status: DC

## 2017-02-18 NOTE — Telephone Encounter (Signed)
Patient has scheduled medication fu for 4/25.  Is requesting script for ranitidine to be sent to [facility] at [address].

## 2017-02-19 MED ORDER — RANITIDINE HCL 150 MG PO TABS: 150.0000 mg | ORAL_TABLET | Freq: Every day | ORAL | 0 refills | Status: DC

## 2017-02-19 NOTE — Addendum Note (Signed)
Addended by: Terence Lux B on: 02/19/2017 11:35 AM   Modules accepted: Orders

## 2017-02-19 NOTE — Telephone Encounter (Signed)
Rx was sent to wrong pharmacy please resend to [facility] on [address].   Please cancel one at [facility]

## 2017-02-19 NOTE — Telephone Encounter (Signed)
RX resent to [facility].  RX sent to [facility] has been cancelled.

## 2017-03-05 ENCOUNTER — Ambulatory Visit: Payer: Medicare Other | Admitting: Internal Medicine

## 2017-03-06 ENCOUNTER — Encounter: Payer: Self-pay | Admitting: Internal Medicine

## 2017-03-06 ENCOUNTER — Ambulatory Visit (INDEPENDENT_AMBULATORY_CARE_PROVIDER_SITE_OTHER): Payer: Medicare Other | Admitting: Internal Medicine

## 2017-03-06 VITALS — BP 140/78 | HR 106 | Temp 97.3°F | Resp 16 | Wt 135.0 lb

## 2017-03-06 DIAGNOSIS — R600 Localized edema: Secondary | ICD-10-CM

## 2017-03-06 DIAGNOSIS — K219 Gastro-esophageal reflux disease without esophagitis: Secondary | ICD-10-CM | POA: Diagnosis not present

## 2017-03-06 DIAGNOSIS — I10 Essential (primary) hypertension: Secondary | ICD-10-CM | POA: Diagnosis not present

## 2017-03-06 MED ORDER — BENAZEPRIL HCL 10 MG PO TABS: ORAL_TABLET | ORAL | 1 refills | Status: DC

## 2017-03-06 MED ORDER — PROMETHAZINE HCL 12.5 MG PO TABS: 12.5000 mg | ORAL_TABLET | Freq: Three times a day (TID) | ORAL | 3 refills | Status: DC | PRN

## 2017-03-06 MED ORDER — FUROSEMIDE 20 MG PO TABS: 20.0000 mg | ORAL_TABLET | Freq: Every day | ORAL | 1 refills | Status: DC

## 2017-03-06 NOTE — Assessment & Plan Note (Signed)
BP well controlled Current regimen effective and well tolerated Continue current medications at current doses  

## 2017-03-06 NOTE — Patient Instructions (Addendum)
Increase the furosemide to 20 mg daily.   As you decrease the steroids hopefully you will need less of the water pill.   A prescription for phenergan (an anti-nausea medication) was sent to your pharmacy.     Follow u pin 6 months

## 2017-03-06 NOTE — Assessment & Plan Note (Signed)
Continue daily zantac - increase to BID it GERD persists

## 2017-03-06 NOTE — Progress Notes (Signed)
Subjective:    Patient ID: Gloria Warren, female    DOB: [birth year], 83 y.o.   MRN: [id number]  HPI The patient is here for follow up.  Leg edema:  She continues to have leg edema.  It is better with elevation.  The legs do not bother her much - she denies pain.  She did not tolerate compression socks.   Hypertension: She is taking her medication daily. She is compliant with a low sodium diet.  She denies chest pain, palpitations, shortness of breath and regular headaches. She is not exercising regularly.  She does monitor her blood pressure at home and it is well controlled.    Nausea:  She has nausea some mornings - not every morning.  Sometimes it takes hours to go away.  She denies GERD in the morning.  She has taken an anti-nausea medication and it did help.    GERD:  She is taking her medication daily as prescribed.  She has GERD symptoms at night sometimes, but not nightly.  She denies eating late at night.  There have been no changes in her medications.    Medications and allergies reviewed with patient and updated if appropriate.  Patient Active Problem List   Diagnosis Date Noted  . Gastroenteritis 12/09/2016  . SIRS (systemic inflammatory response syndrome) ([place]) 12/09/2016  . AKI (acute kidney injury) ([place]) 12/09/2016  . Osteoarthritis of both feet 09/25/2016  . Raynaud disease 09/25/2016  . High risk medication use 09/24/2016  . Primary osteoarthritis of both knees 09/24/2016  . Primary osteoarthritis of both hands 09/24/2016  . Chronic kidney disease (CKD) stage G3b 09/24/2016  . Leg edema 11/28/2015  . GERD (gastroesophageal reflux disease) 11/28/2015  . Dysuria 03/25/2014  . Psoriasis 03/11/2013  . SKIN CANCER, HX OF 06/26/2010  . Allergic rhinitis 06/23/2009  . Hyperglycemia 06/23/2009  . Rheumatoid arthritis ([place]) 02/25/2008  . Hyperlipidemia 11/17/2007  . Essential hypertension 11/17/2007    Current Outpatient Prescriptions on File Prior to Visit    Medication Sig Dispense Refill  . acetaminophen (TYLENOL) 325 MG tablet Take 650 mg by mouth every 6 (six) hours as needed for mild pain.     . benazepril (LOTENSIN) 10 MG tablet TAKE 1 TABLET(10 MG) BY MOUTH DAILY 90 tablet 1  . Calcium Carbonate (CALTRATE 600 PO) Take 600 mg by mouth 2 (two) times daily.      . Certolizumab Pegol (CIMZIA [place]) Inject 1 each into the skin every 14 (fourteen) days. Pt's on a taper dose. ( every 2 weeks till patient get to 1 injection every monthO    . chlorhexidine (PERIDEX) 0.12 % solution 1 mL by Mouth Rinse route daily.    . cholecalciferol (VITAMIN D) 1000 units tablet Take 1,000 Units by mouth daily.    . diclofenac sodium (VOLTAREN) 1 % GEL APP 1 GRAM EXT AA TID PRF PAIN  2  . diphenhydrAMINE (BENADRYL) 25 mg capsule Take 25 mg by mouth once. One hour Prior to infusion    . Fexofenadine HCl (ALLEGRA PO) Take 180 mg by mouth at bedtime.     . fluocinonide ointment (LIDEX) 0.05 %     . furosemide (LASIX) 20 MG tablet Take 1 tablet (20 mg total) by mouth daily. 90 tablet 1  . hydroxychloroquine (PLAQUENIL) 200 MG tablet Take 200 mg by mouth 2 (two) times daily.   0  . Multiple Vitamins-Minerals (VISION-VITE PRESERVE PO) Take by mouth.    . predniSONE (DELTASONE) 20 MG  tablet Take 12 mg by mouth daily with breakfast.     . ranitidine (ZANTAC) 150 MG tablet Take 1 tablet (150 mg total) by mouth at bedtime. 90 tablet 0  . vancomycin (VANCOCIN) 50 mg/mL oral solution Take 2.5 mLs (125 mg total) by mouth 4 (four) times daily. 110 mL 0   No current facility-administered medications on file prior to visit.     Past Medical History:  Diagnosis Date  . Allergic rhinitis   . Decreased GFR   . Endometriosis   . History of skin cancer    squamous & basal cell ;Dr Denna Haggard  . Hyperlipidemia   . Hypertension   . Osteoarthritis   . Psoriasis   . Raynaud disease   . Rheumatoid arthritis(714.0)    Dr. Estanislado Pandy  . UTI (lower urinary tract infection) 5/15    Proteus , R to Nitrofurantoin & Penicillin    Past Surgical History:  Procedure Laterality Date  . APPENDECTOMY     age 85  . CATARACT EXTRACTION Bilateral   . COLONOSCOPY     Neg 2; [place] , [place]  . G 2  P 2    . TONSILLECTOMY    . TOTAL ABDOMINAL HYSTERECTOMY     age 25 for endometriosis (no BSO)    Social History   Social History  . Marital status: Married    Spouse name: N/A  . Number of children: N/A  . Years of education: N/A   Social History Main Topics  . Smoking status: Never Smoker  . Smokeless tobacco: Never Used  . Alcohol use 0.0 oz/week     Comment:  rarely  . Drug use: No  . Sexual activity: Yes    Partners: Male    Birth control/ protection: Surgical     Comment: TAH   Other Topics Concern  . Not on file   Social History Narrative  . No narrative on file    Family History  Problem Relation Age of Onset  . Breast cancer Mother 51  . Hypertension Mother   . Gout Mother   . Hypertension Father   . Heart attack Father 2  . Stroke Sister     doing well  . Diabetes Maternal Aunt   . Hypertension Son   . Stroke Maternal Grandmother     late 79s    Review of Systems  Constitutional: Positive for chills. Negative for fever.  Respiratory: Negative for cough, shortness of breath and wheezing.   Cardiovascular: Positive for leg swelling. Negative for chest pain and palpitations.  Neurological: Negative for light-headedness and headaches.   Constitutional: Positive for chills (at night). Negative for fatigue and fever.  Respiratory: Negative for cough, shortness of breath and wheezing.   Cardiovascular: Positive for leg swelling. Negative for chest pain and palpitations.  Gastrointestinal: Negative for abdominal pain.  Neurological: Negative for light-headedness and headaches.     Objective:   Vitals:   03/06/17 1545  BP: 140/78  Pulse: (!) 106  Resp: 16  Temp: 97.3 F (36.3 C)   Wt Readings from Last 3 Encounters:  03/06/17 135 lb  (61.2 kg)  12/09/16 131 lb (59.4 kg)  10/30/16 134 lb (60.8 kg)   Body mass index is 26.37 kg/m.   Physical Exam    Constitutional: Appears well-developed and well-nourished. No distress.  HENT:  Head: Normocephalic and atraumatic.  Neck: Neck supple. No tracheal deviation present. No thyromegaly present.  No cervical lymphadenopathy Cardiovascular: Normal rate, regular rhythm and normal  heart sounds.   No murmur heard. No carotid bruit .  3+ b/l LE edema, pockets of fluid in both legs - no breaks in skin Pulmonary/Chest: Effort normal and breath sounds normal. No respiratory distress. No has no wheezes. No rales.  Skin: Skin is warm and dry. Not diaphoretic.  Psychiatric: Normal mood and affect. Behavior is normal.      Assessment & Plan:    See Problem List for Assessment and Plan of chronic medical problems.

## 2017-03-06 NOTE — Assessment & Plan Note (Signed)
Swelling significant and pockets of fluid in both legs Increase lasix to 20 mg daily

## 2017-03-06 NOTE — Progress Notes (Signed)
Pre visit review using our clinic review tool, if applicable. No additional management support is needed unless otherwise documented below in the visit note. 

## 2017-03-14 DIAGNOSIS — M0579 Rheumatoid arthritis with rheumatoid factor of multiple sites without organ or systems involvement: Secondary | ICD-10-CM | POA: Diagnosis not present

## 2017-04-24 DIAGNOSIS — M150 Primary generalized (osteo)arthritis: Secondary | ICD-10-CM | POA: Diagnosis not present

## 2017-04-24 DIAGNOSIS — M0579 Rheumatoid arthritis with rheumatoid factor of multiple sites without organ or systems involvement: Secondary | ICD-10-CM | POA: Diagnosis not present

## 2017-04-30 DIAGNOSIS — M0579 Rheumatoid arthritis with rheumatoid factor of multiple sites without organ or systems involvement: Secondary | ICD-10-CM | POA: Diagnosis not present

## 2017-05-22 ENCOUNTER — Other Ambulatory Visit: Payer: Self-pay | Admitting: Internal Medicine

## 2017-05-28 DIAGNOSIS — M0579 Rheumatoid arthritis with rheumatoid factor of multiple sites without organ or systems involvement: Secondary | ICD-10-CM | POA: Diagnosis not present

## 2017-06-25 ENCOUNTER — Ambulatory Visit (INDEPENDENT_AMBULATORY_CARE_PROVIDER_SITE_OTHER): Payer: Medicare Other | Admitting: Internal Medicine

## 2017-06-25 ENCOUNTER — Encounter: Payer: Self-pay | Admitting: Internal Medicine

## 2017-06-25 VITALS — BP 140/72 | HR 96 | Temp 98.6°F | Resp 16

## 2017-06-25 DIAGNOSIS — I10 Essential (primary) hypertension: Secondary | ICD-10-CM

## 2017-06-25 DIAGNOSIS — R600 Localized edema: Secondary | ICD-10-CM | POA: Diagnosis not present

## 2017-06-25 DIAGNOSIS — N183 Chronic kidney disease, stage 3 (moderate): Secondary | ICD-10-CM | POA: Diagnosis not present

## 2017-06-25 DIAGNOSIS — N1832 Chronic kidney disease, stage 3b: Secondary | ICD-10-CM

## 2017-06-25 DIAGNOSIS — R7303 Prediabetes: Secondary | ICD-10-CM | POA: Diagnosis not present

## 2017-06-25 MED ORDER — FUROSEMIDE 40 MG PO TABS: 40.0000 mg | ORAL_TABLET | Freq: Every day | ORAL | 1 refills | Status: DC

## 2017-06-25 NOTE — Progress Notes (Signed)
Subjective:    Patient ID: Gloria Warren, female    DOB: November 02, 1931, 83 y.o.   MRN: [id number]  HPI She is here for an acute visit.   Leg swelling: In April we increased her lasix to 20 mg daily.  Previously she was taking it every other day. She had 3+ pitting edema in her legs at that time. She states the increase in dose has not helped. She elevates her legs.  She is compliant with a low sodium diet.  The swelling is uncomfortable.  She has pockets of fluid.  She is currently not able to exercise due to her severe rheumatoid arthritis. She denies chest pain, palpitations and shortness of breath.   Hypertension: She is taking her medication daily. She is compliant with a low sodium diet.  She denies chest pain, palpitations, shortness of breath and regular headaches. She is not exercising regularly.  She does monitor her blood pressure at home - 120/68 yesterday.    She had blood work two months ago - her kidney function was stable.    Prediabetes: She is compliant with a low sugar/carbohydrate diet. She is not able to exercise regularly.  Medications and allergies reviewed with patient and updated if appropriate.  Patient Active Problem List   Diagnosis Date Noted  . Osteoarthritis of both feet 09/25/2016  . Raynaud disease 09/25/2016  . High risk medication use 09/24/2016  . Primary osteoarthritis of both knees 09/24/2016  . Primary osteoarthritis of both hands 09/24/2016  . Chronic kidney disease (CKD) stage G3b 09/24/2016  . Leg edema 11/28/2015  . GERD (gastroesophageal reflux disease) 11/28/2015  . Psoriasis 03/11/2013  . SKIN CANCER, HX OF 06/26/2010  . Allergic rhinitis 06/23/2009  . Hyperglycemia 06/23/2009  . Rheumatoid arthritis ([place]) 02/25/2008  . Hyperlipidemia 11/17/2007  . Essential hypertension 11/17/2007    Current Outpatient Prescriptions on File Prior to Visit  Medication Sig Dispense Refill  . acetaminophen (TYLENOL) 325 MG tablet Take 650 mg by  mouth every 6 (six) hours as needed for mild pain.     . benazepril (LOTENSIN) 10 MG tablet TAKE 1 TABLET(10 MG) BY MOUTH DAILY 90 tablet 1  . Calcium Carbonate (CALTRATE 600 PO) Take 600 mg by mouth 2 (two) times daily.      . Certolizumab Pegol (CIMZIA [place]) Inject 1 each into the skin every 14 (fourteen) days. Pt's on a taper dose. ( every 2 weeks till patient get to 1 injection every monthO    . chlorhexidine (PERIDEX) 0.12 % solution 1 mL by Mouth Rinse route daily.    . cholecalciferol (VITAMIN D) 1000 units tablet Take 1,000 Units by mouth daily.    . diclofenac sodium (VOLTAREN) 1 % GEL APP 1 GRAM EXT AA TID PRF PAIN  2  . diphenhydrAMINE (BENADRYL) 25 mg capsule Take 25 mg by mouth once. One hour Prior to infusion    . Fexofenadine HCl (ALLEGRA PO) Take 180 mg by mouth at bedtime.     . fluocinonide ointment (LIDEX) 0.05 %     . furosemide (LASIX) 20 MG tablet Take 1 tablet (20 mg total) by mouth daily. 90 tablet 1  . hydroxychloroquine (PLAQUENIL) 200 MG tablet Take 200 mg by mouth 2 (two) times daily.   0  . Multiple Vitamins-Minerals (VISION-VITE PRESERVE PO) Take by mouth.    . predniSONE (DELTASONE) 20 MG tablet Take 12 mg by mouth daily with breakfast.     . promethazine (PHENERGAN) 12.5 MG tablet Take  1 tablet (12.5 mg total) by mouth every 8 (eight) hours as needed for nausea or vomiting. 30 tablet 3  . ranitidine (ZANTAC) 150 MG tablet TAKE 1 TABLET(150 MG) BY MOUTH AT BEDTIME 90 tablet 1   No current facility-administered medications on file prior to visit.     Past Medical History:  Diagnosis Date  . Allergic rhinitis   . Decreased GFR   . Endometriosis   . History of skin cancer    squamous & basal cell ;Dr Denna Haggard  . Hyperlipidemia   . Hypertension   . Osteoarthritis   . Psoriasis   . Raynaud disease   . Rheumatoid arthritis(714.0)    Dr. Estanislado Pandy  . UTI (lower urinary tract infection) 5/15   Proteus , R to Nitrofurantoin & Penicillin    Past Surgical  History:  Procedure Laterality Date  . APPENDECTOMY     age 60  . CATARACT EXTRACTION Bilateral   . COLONOSCOPY     Neg 2; [place] ,   . G 2  P 2    . TONSILLECTOMY    . TOTAL ABDOMINAL HYSTERECTOMY     age 72 for endometriosis (no BSO)    Social History   Social History  . Marital status: Married    Spouse name: N/A  . Number of children: N/A  . Years of education: N/A   Social History Main Topics  . Smoking status: Never Smoker  . Smokeless tobacco: Never Used  . Alcohol use 0.0 oz/week     Comment:  rarely  . Drug use: No  . Sexual activity: Yes    Partners: Male    Birth control/ protection: Surgical     Comment: TAH   Other Topics Concern  . Not on file   Social History Narrative  . No narrative on file    Family History  Problem Relation Age of Onset  . Breast cancer Mother 100  . Hypertension Mother   . Gout Mother   . Hypertension Father   . Heart attack Father 73  . Stroke Sister        doing well  . Diabetes Maternal Aunt   . Hypertension Son   . Stroke Maternal Grandmother        late 64s    Review of Systems  Constitutional: Positive for chills (at night, occasional, but chronic). Negative for fever.  Respiratory: Positive for wheezing (mild, in morning). Negative for cough and shortness of breath.   Cardiovascular: Positive for leg swelling. Negative for chest pain and palpitations.  Neurological: Positive for headaches (intermittent). Negative for light-headedness.       Objective:   Vitals:   06/25/17 1533 06/25/17 1534  BP: (!) 146/84 140/72  Pulse: 96   Resp: 16   Temp: 98.6 F (37 C)   SpO2: 96%    There were no vitals filed for this visit. There is no height or weight on file to calculate BMI.  Wt Readings from Last 3 Encounters:  03/06/17 135 lb (61.2 kg)  12/09/16 131 lb (59.4 kg)  10/30/16 134 lb (60.8 kg)     Physical Exam  Constitutional: She appears well-developed and well-nourished. No distress.  HENT:    Head: Normocephalic and atraumatic.  Eyes: Conjunctivae are normal.  Neck: Neck supple. No tracheal deviation present. No thyromegaly present.  Cardiovascular: Normal rate, regular rhythm and normal heart sounds.   No murmur heard. Pulmonary/Chest: Effort normal and breath sounds normal. No respiratory distress. She has no  wheezes. She has no rales.  Musculoskeletal: She exhibits edema (2 + pitting edema half way up upper leg b/l,).  Lymphadenopathy:    She has no cervical adenopathy.  Skin: Skin is warm and dry. She is not diaphoretic. No erythema.          Assessment & Plan:   See Problem List for Assessment and Plan of chronic medical problems.

## 2017-06-25 NOTE — Patient Instructions (Addendum)
Have blood work done early next week.   Test(s) ordered today. Your results will be released to [place] (or called to you) after review, usually within 72hours after test completion. If any changes need to be made, you will be notified at that same time.   Medications reviewed and updated.  Changes include increasing lasix to 40 mg daily.  Your prescription(s) have been submitted to your pharmacy. Please take as directed and contact our office if you believe you are having problem(s) with the medication(s).  A referral was ordered for a kidney doctor and someone will call you to schedule that appointment.   An Echo or ultrasound of your heart was ordered. Someone will call you to schedule that.    Please followup in 2 months

## 2017-06-25 NOTE — Assessment & Plan Note (Signed)
H/o prediabetes Check a1c

## 2017-06-25 NOTE — Assessment & Plan Note (Signed)
Blood pressure well-controlled at home, slightly elevated here today Continue current medications, but we will be increasing Lasix for leg edema CMP early next week Continue to monitor blood pressure at home

## 2017-06-25 NOTE — Assessment & Plan Note (Signed)
CMP early next week Kidney function has been stable, but we will be increasing her Lasix because of increased leg edema She is having difficulty with fluid balance We will refer to nephrology

## 2017-06-25 NOTE — Assessment & Plan Note (Signed)
Currently taking Lasix 20 mg daily and she has significant bilateral lower extremity edema Will increase to 40 mg daily No symptoms or signs of heart failure. Has not had an echocardiogram and I will order one today to evaluate her ejection fracture Check CBC, TSH She does have decreased kidney function and because she is having problems with her fluid balance I will refer to nephrology for their assistance and help [place] closely monitor her kidney function Follow-up in 2 months, advised if there is no improvement in the next couple of weeks

## 2017-07-03 ENCOUNTER — Other Ambulatory Visit (INDEPENDENT_AMBULATORY_CARE_PROVIDER_SITE_OTHER): Payer: Medicare Other

## 2017-07-03 DIAGNOSIS — N183 Chronic kidney disease, stage 3 (moderate): Secondary | ICD-10-CM

## 2017-07-03 DIAGNOSIS — R7303 Prediabetes: Secondary | ICD-10-CM

## 2017-07-03 DIAGNOSIS — R600 Localized edema: Secondary | ICD-10-CM | POA: Diagnosis not present

## 2017-07-03 DIAGNOSIS — N1832 Chronic kidney disease, stage 3b: Secondary | ICD-10-CM

## 2017-07-03 DIAGNOSIS — I10 Essential (primary) hypertension: Secondary | ICD-10-CM | POA: Diagnosis not present

## 2017-07-03 LAB — HEMOGLOBIN A1C: Hgb A1c MFr Bld: 5.4 % (ref 4.6–6.5)

## 2017-07-03 LAB — CBC WITH DIFFERENTIAL/PLATELET
BASOS PCT: 0.5 % (ref 0.0–3.0)
Basophils Absolute: 0.1 10*3/uL (ref 0.0–0.1)
EOS PCT: 0.9 % (ref 0.0–5.0)
Eosinophils Absolute: 0.1 10*3/uL (ref 0.0–0.7)
HEMATOCRIT: 40.3 % (ref 36.0–46.0)
HEMOGLOBIN: 13.2 g/dL (ref 12.0–15.0)
LYMPHS PCT: 13.8 % (ref 12.0–46.0)
Lymphs Abs: 1.4 10*3/uL (ref 0.7–4.0)
MCHC: 32.8 g/dL (ref 30.0–36.0)
MCV: 100 fl (ref 78.0–100.0)
MONOS PCT: 7.5 % (ref 3.0–12.0)
Monocytes Absolute: 0.7 10*3/uL (ref 0.1–1.0)
NEUTROS ABS: 7.7 10*3/uL (ref 1.4–7.7)
Neutrophils Relative %: 77.3 % — ABNORMAL HIGH (ref 43.0–77.0)
PLATELETS: 297 10*3/uL (ref 150.0–400.0)
RBC: 4.03 Mil/uL (ref 3.87–5.11)
RDW: 13.3 % (ref 11.5–15.5)
WBC: 10 10*3/uL (ref 4.0–10.5)

## 2017-07-03 LAB — COMPREHENSIVE METABOLIC PANEL
ALT: 12 U/L (ref 0–35)
AST: 16 U/L (ref 0–37)
Albumin: 3.8 g/dL (ref 3.5–5.2)
Alkaline Phosphatase: 50 U/L (ref 39–117)
BILIRUBIN TOTAL: 0.6 mg/dL (ref 0.2–1.2)
BUN: 45 mg/dL — ABNORMAL HIGH (ref 6–23)
CALCIUM: 9.5 mg/dL (ref 8.4–10.5)
CHLORIDE: 101 meq/L (ref 96–112)
CO2: 31 meq/L (ref 19–32)
Creatinine, Ser: 1.64 mg/dL — ABNORMAL HIGH (ref 0.40–1.20)
GFR: 31.71 mL/min — AB (ref >60.00)
GLUCOSE: 111 mg/dL — AB (ref 70–99)
Potassium: 4.3 mEq/L (ref 3.5–5.1)
Sodium: 139 mEq/L (ref 135–145)
Total Protein: 6.8 g/dL (ref 6.0–8.3)

## 2017-07-04 ENCOUNTER — Encounter: Payer: Self-pay | Admitting: Internal Medicine

## 2017-07-04 LAB — TSH: TSH: 1.71 u[IU]/mL (ref 0.35–4.50)

## 2017-07-09 ENCOUNTER — Other Ambulatory Visit: Payer: Self-pay

## 2017-07-09 ENCOUNTER — Ambulatory Visit (HOSPITAL_COMMUNITY): Payer: Medicare Other | Attending: Cardiology

## 2017-07-09 DIAGNOSIS — I5030 Unspecified diastolic (congestive) heart failure: Secondary | ICD-10-CM | POA: Diagnosis not present

## 2017-07-09 DIAGNOSIS — R600 Localized edema: Secondary | ICD-10-CM

## 2017-07-11 DIAGNOSIS — H26493 Other secondary cataract, bilateral: Secondary | ICD-10-CM | POA: Diagnosis not present

## 2017-07-11 DIAGNOSIS — Z961 Presence of intraocular lens: Secondary | ICD-10-CM | POA: Diagnosis not present

## 2017-07-11 DIAGNOSIS — Z79899 Other long term (current) drug therapy: Secondary | ICD-10-CM | POA: Diagnosis not present

## 2017-07-11 DIAGNOSIS — H40013 Open angle with borderline findings, low risk, bilateral: Secondary | ICD-10-CM | POA: Diagnosis not present

## 2017-07-11 DIAGNOSIS — H353131 Nonexudative age-related macular degeneration, bilateral, early dry stage: Secondary | ICD-10-CM | POA: Diagnosis not present

## 2017-07-11 DIAGNOSIS — H524 Presbyopia: Secondary | ICD-10-CM | POA: Diagnosis not present

## 2017-07-23 DIAGNOSIS — M0579 Rheumatoid arthritis with rheumatoid factor of multiple sites without organ or systems involvement: Secondary | ICD-10-CM | POA: Diagnosis not present

## 2017-08-12 DIAGNOSIS — D631 Anemia in chronic kidney disease: Secondary | ICD-10-CM | POA: Diagnosis not present

## 2017-08-12 DIAGNOSIS — M069 Rheumatoid arthritis, unspecified: Secondary | ICD-10-CM | POA: Diagnosis not present

## 2017-08-12 DIAGNOSIS — N183 Chronic kidney disease, stage 3 (moderate): Secondary | ICD-10-CM | POA: Diagnosis not present

## 2017-08-12 DIAGNOSIS — R600 Localized edema: Secondary | ICD-10-CM | POA: Diagnosis not present

## 2017-08-13 ENCOUNTER — Ambulatory Visit: Payer: Medicare Other

## 2017-08-14 ENCOUNTER — Ambulatory Visit (INDEPENDENT_AMBULATORY_CARE_PROVIDER_SITE_OTHER): Payer: Medicare Other

## 2017-08-14 DIAGNOSIS — E663 Overweight: Secondary | ICD-10-CM | POA: Diagnosis not present

## 2017-08-14 DIAGNOSIS — M150 Primary generalized (osteo)arthritis: Secondary | ICD-10-CM | POA: Diagnosis not present

## 2017-08-14 DIAGNOSIS — Z23 Encounter for immunization: Secondary | ICD-10-CM

## 2017-08-14 DIAGNOSIS — N390 Urinary tract infection, site not specified: Secondary | ICD-10-CM | POA: Diagnosis not present

## 2017-08-14 DIAGNOSIS — N183 Chronic kidney disease, stage 3 (moderate): Secondary | ICD-10-CM | POA: Diagnosis not present

## 2017-08-14 DIAGNOSIS — M0579 Rheumatoid arthritis with rheumatoid factor of multiple sites without organ or systems involvement: Secondary | ICD-10-CM | POA: Diagnosis not present

## 2017-08-14 DIAGNOSIS — Z6828 Body mass index (BMI) 28.0-28.9, adult: Secondary | ICD-10-CM | POA: Diagnosis not present

## 2017-08-16 ENCOUNTER — Other Ambulatory Visit: Payer: Self-pay | Admitting: Nephrology

## 2017-08-16 DIAGNOSIS — N183 Chronic kidney disease, stage 3 unspecified: Secondary | ICD-10-CM

## 2017-08-16 DIAGNOSIS — R600 Localized edema: Secondary | ICD-10-CM

## 2017-08-20 ENCOUNTER — Ambulatory Visit
Admission: RE | Admit: 2017-08-20 | Discharge: 2017-08-20 | Disposition: A | Discharge status: Home / Self Care | Payer: Medicare Other | Source: Ambulatory Visit | Attending: Nephrology | Admitting: Nephrology

## 2017-08-20 DIAGNOSIS — R600 Localized edema: Secondary | ICD-10-CM

## 2017-08-20 DIAGNOSIS — N183 Chronic kidney disease, stage 3 unspecified: Secondary | ICD-10-CM

## 2017-08-28 DIAGNOSIS — N183 Chronic kidney disease, stage 3 (moderate): Secondary | ICD-10-CM | POA: Diagnosis not present

## 2017-09-04 ENCOUNTER — Encounter: Payer: Self-pay | Admitting: Internal Medicine

## 2017-09-04 ENCOUNTER — Ambulatory Visit (INDEPENDENT_AMBULATORY_CARE_PROVIDER_SITE_OTHER): Payer: Medicare Other | Admitting: Internal Medicine

## 2017-09-04 VITALS — BP 124/76 | HR 96 | Temp 98.0°F | Resp 16 | Wt 144.0 lb

## 2017-09-04 DIAGNOSIS — K219 Gastro-esophageal reflux disease without esophagitis: Secondary | ICD-10-CM

## 2017-09-04 DIAGNOSIS — N183 Chronic kidney disease, stage 3 (moderate): Secondary | ICD-10-CM

## 2017-09-04 DIAGNOSIS — R600 Localized edema: Secondary | ICD-10-CM

## 2017-09-04 DIAGNOSIS — M0579 Rheumatoid arthritis with rheumatoid factor of multiple sites without organ or systems involvement: Secondary | ICD-10-CM

## 2017-09-04 DIAGNOSIS — I10 Essential (primary) hypertension: Secondary | ICD-10-CM

## 2017-09-04 DIAGNOSIS — R7303 Prediabetes: Secondary | ICD-10-CM | POA: Diagnosis not present

## 2017-09-04 DIAGNOSIS — N1832 Chronic kidney disease, stage 3b: Secondary | ICD-10-CM

## 2017-09-04 NOTE — Assessment & Plan Note (Signed)
Taking lasix 80 mg daily in am - still with severe edema Discussed seeing vascular or changing lasix to torsemide  She will consider this but for now will continue the lasix at current dose Continue to elevate legs

## 2017-09-04 NOTE — Assessment & Plan Note (Signed)
BP well controlled Current regimen effective and well tolerated Continue current medications at current doses  

## 2017-09-04 NOTE — Assessment & Plan Note (Signed)
Stable Saw nephrology - work up non concerning Follow up with nephrology prn

## 2017-09-04 NOTE — Assessment & Plan Note (Signed)
Lab Results  Component Value Date   HGBA1C 5.4 07/03/2017   Continue low sugar/carb diet Recheck a1c in 6 months

## 2017-09-04 NOTE — Assessment & Plan Note (Addendum)
Pain is severe - no RA medication works long term - some have not worked at all Current medication is not working, may need to [employer] per Dr Amil Amen Taking hydrocodone at night only Taking plaquenil and prednisone Discussed consider hemp oil and increasing narcotics since her quality of life is so poor

## 2017-09-04 NOTE — Assessment & Plan Note (Signed)
GERD controlled Continue daily medication  

## 2017-09-04 NOTE — Patient Instructions (Signed)
  Medications reviewed and updated.  No changes recommended at this time.     Please followup in 6 months   

## 2017-09-04 NOTE — Progress Notes (Signed)
Subjective:    Patient ID: Gloria Warren, female    DOB: 11-18-1930, 83 y.o.   MRN: [id number]  HPI The patient is here for follow up.  RA:  She is following with Dr Amil Amen.  Her current medication is not helping her pain.  There are no other options - she may been to try the [place] again, which did work for a while.  Her pain is severe.  She takes a hydrocodone at night to help her sleep and she gets 4-5 hrs.  She is concerned about taking more.    Hypertension: She is taking her medication daily. She is compliant with a low sodium diet.  She denies chest pain, palpitations, edema, shortness of breath and regular headaches. She is not exercising regularly.  She does not monitor her blood pressure at home.    GERD:  She is taking her medication daily as prescribed.  She denies any GERD symptoms and feels her GERD is well controlled.   Leg edema:  There is no change in her edema.  She is taking 80 mg of lasix daily and she is unsure if it helps.  She did not see any change after increasing the dose.  She did see nephrology and she thought her edema was dependent.  She advised to follow up as needed.    CKD:  She did see nephrology and there were no concerning findings.  They advised follow up as needed.    Prediabetes:  She is compliant with a low sugar/carbohydrate diet.  she has to force herself to eat sometimes.  She is not exercising regularly.   Medications and allergies reviewed with patient and updated if appropriate.  Patient Active Problem List   Diagnosis Date Noted  . Osteoarthritis of both feet 09/25/2016  . Raynaud disease 09/25/2016  . High risk medication use 09/24/2016  . Primary osteoarthritis of both knees 09/24/2016  . Primary osteoarthritis of both hands 09/24/2016  . Chronic kidney disease (CKD) stage G3b 09/24/2016  . Leg edema 11/28/2015  . GERD (gastroesophageal reflux disease) 11/28/2015  . Psoriasis 03/11/2013  . SKIN CANCER, HX OF 06/26/2010  .  Allergic rhinitis 06/23/2009  . Prediabetes 06/23/2009  . Rheumatoid arthritis ([place]) 02/25/2008  . Hyperlipidemia 11/17/2007  . Essential hypertension 11/17/2007    Current Outpatient Prescriptions on File Prior to Visit  Medication Sig Dispense Refill  . acetaminophen (TYLENOL) 325 MG tablet Take 650 mg by mouth every 6 (six) hours as needed for mild pain.     . benazepril (LOTENSIN) 10 MG tablet TAKE 1 TABLET(10 MG) BY MOUTH DAILY 90 tablet 1  . Calcium Carbonate (CALTRATE 600 PO) Take 600 mg by mouth 2 (two) times daily.      . Certolizumab Pegol (CIMZIA [place]) Inject 1 each into the skin every 14 (fourteen) days. Pt's on a taper dose. ( every 2 weeks till patient get to 1 injection every monthO    . chlorhexidine (PERIDEX) 0.12 % solution 1 mL by Mouth Rinse route daily.    . cholecalciferol (VITAMIN D) 1000 units tablet Take 1,000 Units by mouth daily.    . diclofenac sodium (VOLTAREN) 1 % GEL APP 1 GRAM EXT AA TID PRF PAIN  2  . diphenhydrAMINE (BENADRYL) 25 mg capsule Take 25 mg by mouth once. One hour Prior to infusion    . Fexofenadine HCl (ALLEGRA PO) Take 180 mg by mouth at bedtime.     . fluocinonide ointment (LIDEX) 0.05 %     .  furosemide (LASIX) 40 MG tablet Take 1 tablet (40 mg total) by mouth daily. (Patient taking differently: Take 80 mg by mouth daily. ) 90 tablet 1  . hydroxychloroquine (PLAQUENIL) 200 MG tablet Take 200 mg by mouth 2 (two) times daily.   0  . Multiple Vitamins-Minerals (VISION-VITE PRESERVE PO) Take by mouth.    . predniSONE (DELTASONE) 20 MG tablet Take 10 mg by mouth daily with breakfast.     . promethazine (PHENERGAN) 12.5 MG tablet Take 1 tablet (12.5 mg total) by mouth every 8 (eight) hours as needed for nausea or vomiting. 30 tablet 3  . ranitidine (ZANTAC) 150 MG tablet TAKE 1 TABLET(150 MG) BY MOUTH AT BEDTIME 90 tablet 1   No current facility-administered medications on file prior to visit.     Past Medical History:  Diagnosis Date  .  Allergic rhinitis   . Decreased GFR   . Endometriosis   . History of skin cancer    squamous & basal cell ;Dr Denna Haggard  . Hyperlipidemia   . Hypertension   . Osteoarthritis   . Psoriasis   . Raynaud disease   . Rheumatoid arthritis(714.0)    Dr. Estanislado Pandy  . UTI (lower urinary tract infection) 5/15   Proteus , R to Nitrofurantoin & Penicillin    Past Surgical History:  Procedure Laterality Date  . APPENDECTOMY     age 40  . CATARACT EXTRACTION Bilateral   . COLONOSCOPY     Neg 2; [place] , [place]  . G 2  P 2    . TONSILLECTOMY    . TOTAL ABDOMINAL HYSTERECTOMY     age 40 for endometriosis (no BSO)    Social History   Social History  . Marital status: Married    Spouse name: N/A  . Number of children: N/A  . Years of education: N/A   Social History Main Topics  . Smoking status: Never Smoker  . Smokeless tobacco: Never Used  . Alcohol use 0.0 oz/week     Comment:  rarely  . Drug use: No  . Sexual activity: Yes    Partners: Male    Birth control/ protection: Surgical     Comment: TAH   Other Topics Concern  . None   Social History Narrative  . None    Family History  Problem Relation Age of Onset  . Breast cancer Mother 45  . Hypertension Mother   . Gout Mother   . Hypertension Father   . Heart attack Father 31  . Stroke Sister        doing well  . Diabetes Maternal Aunt   . Hypertension Son   . Stroke Maternal Grandmother        late 48s    Review of Systems  Constitutional: Positive for appetite change (decreased) and chills (at night). Negative for fever.  Respiratory: Negative for cough, shortness of breath and wheezing.   Cardiovascular: Positive for leg swelling. Negative for chest pain and palpitations.  Neurological: Positive for light-headedness and headaches (intermittent).       Objective:   Vitals:   09/04/17 1340  BP: 124/76  Pulse: 96  Resp: 16  Temp: 98 F (36.7 C)  SpO2: 96%   Wt Readings from Last 3 Encounters:    09/04/17 144 lb (65.3 kg)  03/06/17 135 lb (61.2 kg)  12/09/16 131 lb (59.4 kg)   Body mass index is 28.12 kg/m.   Physical Exam    Constitutional: chronically ill appearing, cushingoid  appearing. No distress.  HENT:  Head: Normocephalic and atraumatic.  Neck: Neck supple. No tracheal deviation present. No thyromegaly present.  No cervical lymphadenopathy Cardiovascular: Normal rate, regular rhythm and normal heart sounds.   No murmur heard. No carotid bruit .  2 + pitting edema b/l LE, b/l feet cool to touch Pulmonary/Chest: Effort normal and breath sounds normal. No respiratory distress. No has no wheezes. No rales.  Skin: Skin is warm and dry. Not diaphoretic.  Psychiatric: Normal mood and affect. Behavior is normal.      Assessment & Plan:    See Problem List for Assessment and Plan of chronic medical problems.

## 2017-09-12 ENCOUNTER — Other Ambulatory Visit: Payer: Self-pay | Admitting: Internal Medicine

## 2017-09-17 DIAGNOSIS — M0579 Rheumatoid arthritis with rheumatoid factor of multiple sites without organ or systems involvement: Secondary | ICD-10-CM | POA: Diagnosis not present

## 2017-09-17 DIAGNOSIS — Z79899 Other long term (current) drug therapy: Secondary | ICD-10-CM | POA: Diagnosis not present

## 2017-10-16 DIAGNOSIS — R600 Localized edema: Secondary | ICD-10-CM | POA: Diagnosis not present

## 2017-10-16 DIAGNOSIS — M150 Primary generalized (osteo)arthritis: Secondary | ICD-10-CM | POA: Diagnosis not present

## 2017-10-16 DIAGNOSIS — M25561 Pain in right knee: Secondary | ICD-10-CM | POA: Diagnosis not present

## 2017-10-16 DIAGNOSIS — Z6828 Body mass index (BMI) 28.0-28.9, adult: Secondary | ICD-10-CM | POA: Diagnosis not present

## 2017-10-16 DIAGNOSIS — M25562 Pain in left knee: Secondary | ICD-10-CM | POA: Diagnosis not present

## 2017-10-16 DIAGNOSIS — M0579 Rheumatoid arthritis with rheumatoid factor of multiple sites without organ or systems involvement: Secondary | ICD-10-CM | POA: Diagnosis not present

## 2017-10-16 DIAGNOSIS — M2550 Pain in unspecified joint: Secondary | ICD-10-CM | POA: Diagnosis not present

## 2017-10-16 DIAGNOSIS — E663 Overweight: Secondary | ICD-10-CM | POA: Diagnosis not present

## 2017-11-13 ENCOUNTER — Other Ambulatory Visit: Payer: Self-pay | Admitting: Internal Medicine

## 2017-11-13 DIAGNOSIS — M0579 Rheumatoid arthritis with rheumatoid factor of multiple sites without organ or systems involvement: Secondary | ICD-10-CM | POA: Diagnosis not present

## 2017-11-28 ENCOUNTER — Other Ambulatory Visit: Payer: Self-pay | Admitting: Internal Medicine

## 2017-12-11 ENCOUNTER — Ambulatory Visit: Payer: Self-pay | Admitting: *Deleted

## 2017-12-11 NOTE — Telephone Encounter (Signed)
Attempted to contact pt regarding symptoms; unable to leave message; unable to complete nurse triage at this time.

## 2017-12-11 NOTE — Telephone Encounter (Signed)
Patient states she is having side effect with Zantac. Stomach cramping and increased bowel movements.  Reason for Disposition . Caller has NON-URGENT medication question about med that PCP prescribed and triager unable to answer question  Answer Assessment - Initial Assessment Questions 1. SYMPTOMS: "Do you have any symptoms?"     Patient states she has been having discomfort and would like to stop the Zantac. She would like to know if there are other options. She is going to slow down and stop by taking every other day. 2. SEVERITY: If symptoms are present, ask "Are they mild, moderate or severe?"     Intestinal cramping and increased bowel movements in morning.  Protocols used: MEDICATION QUESTION CALL-A-AH

## 2017-12-11 NOTE — Telephone Encounter (Signed)
Okay to stop the Zantac-she does not need to taper it off.  See if she has any heartburn being off of the medication.  If she does not nothing further is needed.  If she does have additional heartburn we can try something else, but she potentially could have the same symptoms.

## 2017-12-12 NOTE — Telephone Encounter (Signed)
Spoke with pt. Pt understood to call back if heart burn came back after being off medication.

## 2017-12-18 DIAGNOSIS — E663 Overweight: Secondary | ICD-10-CM | POA: Diagnosis not present

## 2017-12-18 DIAGNOSIS — M0579 Rheumatoid arthritis with rheumatoid factor of multiple sites without organ or systems involvement: Secondary | ICD-10-CM | POA: Diagnosis not present

## 2017-12-18 DIAGNOSIS — R600 Localized edema: Secondary | ICD-10-CM | POA: Diagnosis not present

## 2017-12-18 DIAGNOSIS — M2550 Pain in unspecified joint: Secondary | ICD-10-CM | POA: Diagnosis not present

## 2017-12-18 DIAGNOSIS — M150 Primary generalized (osteo)arthritis: Secondary | ICD-10-CM | POA: Diagnosis not present

## 2017-12-18 DIAGNOSIS — Z6827 Body mass index (BMI) 27.0-27.9, adult: Secondary | ICD-10-CM | POA: Diagnosis not present

## 2018-01-07 DIAGNOSIS — M0579 Rheumatoid arthritis with rheumatoid factor of multiple sites without organ or systems involvement: Secondary | ICD-10-CM | POA: Diagnosis not present

## 2018-01-09 DIAGNOSIS — H26493 Other secondary cataract, bilateral: Secondary | ICD-10-CM | POA: Diagnosis not present

## 2018-01-09 DIAGNOSIS — H40013 Open angle with borderline findings, low risk, bilateral: Secondary | ICD-10-CM | POA: Diagnosis not present

## 2018-01-09 DIAGNOSIS — H524 Presbyopia: Secondary | ICD-10-CM | POA: Diagnosis not present

## 2018-01-09 DIAGNOSIS — Z79899 Other long term (current) drug therapy: Secondary | ICD-10-CM | POA: Diagnosis not present

## 2018-01-09 DIAGNOSIS — H353131 Nonexudative age-related macular degeneration, bilateral, early dry stage: Secondary | ICD-10-CM | POA: Diagnosis not present

## 2018-01-09 DIAGNOSIS — Z961 Presence of intraocular lens: Secondary | ICD-10-CM | POA: Diagnosis not present

## 2018-02-10 DIAGNOSIS — M150 Primary generalized (osteo)arthritis: Secondary | ICD-10-CM | POA: Diagnosis not present

## 2018-02-10 DIAGNOSIS — R600 Localized edema: Secondary | ICD-10-CM | POA: Diagnosis not present

## 2018-02-10 DIAGNOSIS — M2550 Pain in unspecified joint: Secondary | ICD-10-CM | POA: Diagnosis not present

## 2018-02-10 DIAGNOSIS — M0579 Rheumatoid arthritis with rheumatoid factor of multiple sites without organ or systems involvement: Secondary | ICD-10-CM | POA: Diagnosis not present

## 2018-02-10 DIAGNOSIS — L03114 Cellulitis of left upper limb: Secondary | ICD-10-CM | POA: Diagnosis not present

## 2018-02-10 DIAGNOSIS — Z6827 Body mass index (BMI) 27.0-27.9, adult: Secondary | ICD-10-CM | POA: Diagnosis not present

## 2018-02-10 DIAGNOSIS — E663 Overweight: Secondary | ICD-10-CM | POA: Diagnosis not present

## 2018-03-05 ENCOUNTER — Ambulatory Visit: Payer: Medicare Other | Admitting: Internal Medicine

## 2018-03-13 ENCOUNTER — Other Ambulatory Visit: Payer: Self-pay | Admitting: Internal Medicine

## 2018-03-19 DIAGNOSIS — C44722 Squamous cell carcinoma of skin of right lower limb, including hip: Secondary | ICD-10-CM | POA: Diagnosis not present

## 2018-03-20 ENCOUNTER — Emergency Department (HOSPITAL_COMMUNITY)
Admission: EM | Admit: 2018-03-20 | Discharge: 2018-03-21 | Disposition: A | Discharge status: Home / Self Care | Payer: Medicare Other | Attending: Emergency Medicine | Admitting: Emergency Medicine

## 2018-03-20 ENCOUNTER — Encounter (HOSPITAL_COMMUNITY): Payer: Self-pay | Admitting: Emergency Medicine

## 2018-03-20 ENCOUNTER — Other Ambulatory Visit: Payer: Self-pay

## 2018-03-20 DIAGNOSIS — S61419A Laceration without foreign body of unspecified hand, initial encounter: Secondary | ICD-10-CM

## 2018-03-20 DIAGNOSIS — S61411A Laceration without foreign body of right hand, initial encounter: Secondary | ICD-10-CM | POA: Diagnosis not present

## 2018-03-20 DIAGNOSIS — M069 Rheumatoid arthritis, unspecified: Secondary | ICD-10-CM | POA: Diagnosis not present

## 2018-03-20 DIAGNOSIS — I129 Hypertensive chronic kidney disease with stage 1 through stage 4 chronic kidney disease, or unspecified chronic kidney disease: Secondary | ICD-10-CM | POA: Diagnosis not present

## 2018-03-20 DIAGNOSIS — N183 Chronic kidney disease, stage 3 (moderate): Secondary | ICD-10-CM | POA: Insufficient documentation

## 2018-03-20 DIAGNOSIS — Y929 Unspecified place or not applicable: Secondary | ICD-10-CM | POA: Diagnosis not present

## 2018-03-20 DIAGNOSIS — Y9289 Other specified places as the place of occurrence of the external cause: Secondary | ICD-10-CM | POA: Diagnosis not present

## 2018-03-20 DIAGNOSIS — S61401A Unspecified open wound of right hand, initial encounter: Secondary | ICD-10-CM | POA: Diagnosis not present

## 2018-03-20 DIAGNOSIS — Y999 Unspecified external cause status: Secondary | ICD-10-CM | POA: Insufficient documentation

## 2018-03-20 DIAGNOSIS — Y9389 Activity, other specified: Secondary | ICD-10-CM | POA: Insufficient documentation

## 2018-03-20 DIAGNOSIS — Z85828 Personal history of other malignant neoplasm of skin: Secondary | ICD-10-CM | POA: Insufficient documentation

## 2018-03-20 NOTE — ED Triage Notes (Signed)
Pt arrived after a fall due to a skin tear on her R hand. Dressing applied, and bleeding is controled, she states that she was getting into the car and fell forward on her hands and knees, denies hitting her head, LOC, or dizziness. Pt reports that she has RA and it causes her knees to "give out" her PCP is aware.

## 2018-03-21 NOTE — ED Provider Notes (Signed)
[facility] EMERGENCY DEPARTMENT Provider Note   CSN: [id number] Arrival date & time: 03/20/18  02-22-49     History   Chief Complaint Chief Complaint  Patient presents with  . Skin Problem    HPI Gloria Warren is a 84 y.o. female.  Patient is here for evaluation of skin tears to right hand after slipping while getting into her car. She has multiple bruises to the UE's bilaterally but reports the only open wounds are to the dorsal right hand. No deformity. No other injury.  The history is provided by the spouse and the patient.    Past Medical History:  Diagnosis Date  . Allergic rhinitis   . Decreased GFR   . Endometriosis   . History of skin cancer    squamous & basal cell ;Dr Denna Haggard  . Hyperlipidemia   . Hypertension   . Osteoarthritis   . Psoriasis   . Raynaud disease   . Rheumatoid arthritis(714.0)    Dr. Estanislado Pandy  . UTI (lower urinary tract infection) 5/15   Proteus , R to Nitrofurantoin & Penicillin    Patient Active Problem List   Diagnosis Date Noted  . Osteoarthritis of both feet 09/25/2016  . Raynaud disease 09/25/2016  . High risk medication use 09/24/2016  . Primary osteoarthritis of both knees 09/24/2016  . Primary osteoarthritis of both hands 09/24/2016  . Chronic kidney disease (CKD) stage G3b 09/24/2016  . Leg edema 11/28/2015  . GERD (gastroesophageal reflux disease) 11/28/2015  . Psoriasis 03/11/2013  . SKIN CANCER, HX OF 06/26/2010  . Allergic rhinitis 06/23/2009  . Prediabetes 06/23/2009  . Rheumatoid arthritis ([place]) 02/25/2008  . Hyperlipidemia 11/17/2007  . Essential hypertension 11/17/2007    Past Surgical History:  Procedure Laterality Date  . APPENDECTOMY     age 40  . CATARACT EXTRACTION Bilateral   . COLONOSCOPY     Neg 2; [place] , [place]  . G 2  P 2    . TONSILLECTOMY    . TOTAL ABDOMINAL HYSTERECTOMY     age 15 for endometriosis (no BSO)     OB History    Gravida  2   Para  2   Term  2   Preterm      AB      Living  1     SAB      TAB      Ectopic      Multiple      Live Births           Obstetric Comments  Daughter passed away in 22-Feb-2005 to AVM.         Home Medications    Prior to Admission medications   Medication Sig Start Date End Date Taking? Authorizing Provider  acetaminophen (TYLENOL) 325 MG tablet Take 650 mg by mouth every 6 (six) hours as needed for mild pain.     [provider]  benazepril (LOTENSIN) 10 MG tablet TAKE 1 TABLET(10 MG) BY MOUTH DAILY 03/13/18   Binnie Rail, MD  Calcium Carbonate (CALTRATE 600 PO) Take 600 mg by mouth 2 (two) times daily.      [provider]  Certolizumab Pegol (CIMZIA Daisy) Inject 1 each into the skin every 14 (fourteen) days. Pt's on a taper dose. ( every 2 weeks till patient get to 1 injection every monthO    [provider]  chlorhexidine (PERIDEX) 0.12 % solution 1 mL by Mouth Rinse route daily. 08/18/13  [provider]  cholecalciferol (VITAMIN D) 1000 units tablet Take 1,000 Units by mouth daily.    [provider]  diclofenac sodium (VOLTAREN) 1 % GEL APP 1 GRAM EXT AA TID PRF PAIN 03/29/16   [provider]  diphenhydrAMINE (BENADRYL) 25 mg capsule Take 25 mg by mouth once. One hour Prior to infusion    [provider]  Fexofenadine HCl (ALLEGRA PO) Take 180 mg by mouth at bedtime.     [provider]  fluocinonide ointment (LIDEX) 0.05 %  08/20/14   [provider]  furosemide (LASIX) 40 MG tablet Take 2 tablets (80 mg total) by mouth daily. 11/28/17   Binnie Rail, MD  hydroxychloroquine (PLAQUENIL) 200 MG tablet Take 200 mg by mouth 2 (two) times daily.  05/07/16   [provider]  Multiple Vitamins-Minerals (VISION-VITE PRESERVE PO) Take by mouth.    [provider]  predniSONE (DELTASONE) 20 MG tablet Take 10 mg by mouth daily with breakfast.     [provider]  promethazine (PHENERGAN) 12.5 MG  tablet Take 1 tablet (12.5 mg total) by mouth every 8 (eight) hours as needed for nausea or vomiting. 03/06/17   Binnie Rail, MD  ranitidine (ZANTAC) 150 MG tablet TAKE 1 TABLET(150 MG) BY MOUTH AT BEDTIME 11/14/17   Binnie Rail, MD    Family History Family History  Problem Relation Age of Onset  . Breast cancer Mother 88  . Hypertension Mother   . Gout Mother   . Hypertension Father   . Heart attack Father 75  . Stroke Sister        doing well  . Diabetes Maternal Aunt   . Hypertension Son   . Stroke Maternal Grandmother        late 75s    Social History Social History   Tobacco Use  . Smoking status: Never Smoker  . Smokeless tobacco: Never Used  Substance Use Topics  . Alcohol use: Yes    Alcohol/week: 0.0 oz    Comment:  rarely  . Drug use: No     Allergies   Arava [leflunomide]; Sulfonamide derivatives; and Remicade [infliximab]   Review of Systems Review of Systems  Musculoskeletal: Negative for myalgias.  Skin: Positive for wound.  Neurological: Negative for weakness and numbness.     Physical Exam Updated Vital Signs BP 118/67 (BP Location: Left Arm)   Pulse 89   Temp 98.7 F (37.1 C) (Oral)   Resp 16   SpO2 100%   Physical Exam  Constitutional: She is oriented to person, place, and time. She appears well-developed and well-nourished.  Neck: Normal range of motion.  Pulmonary/Chest: Effort normal.  Musculoskeletal:  No bony deformity to right hand. FROM of all digits and wrist on right. Minimal tenderness.   Neurological: She is alert and oriented to person, place, and time.  Skin: Skin is warm and dry.  lacerations x 2 to dorsal right hand c/w skin tears. NO active bleeding.      ED Treatments / Results  Labs (all labs ordered are listed, but only abnormal results are displayed) Labs Reviewed - No data to display  EKG None  Radiology No results found.  Procedures .Marland KitchenLaceration Repair Date/Time: 03/21/2018 1:00 AM Performed by:  Charlann Lange, PA-C Authorized by: Charlann Lange, PA-C   Consent:    Consent obtained:  Verbal   Consent given by:  Patient Anesthesia (see MAR for exact dosages):    Anesthesia method:  None  Laceration details:    Location:  Hand   Hand location:  R hand, dorsum   Length (cm):  4 Repair type:    Repair type:  Simple Treatment:    Area cleansed with:  Saline Skin repair:    Repair method:  Steri-Strips   Number of Steri-Strips:  3 Approximation:    Approximation:  Close Post-procedure details:    Patient tolerance of procedure:  Tolerated well, no immediate complications .Marland KitchenLaceration Repair Date/Time: 03/21/2018 1:02 AM Performed by: Charlann Lange, PA-C Authorized by: Charlann Lange, PA-C   Consent:    Consent obtained:  Verbal   Consent given by:  Patient Anesthesia (see MAR for exact dosages):    Anesthesia method:  None Laceration details:    Location:  Hand   Hand location:  R hand, dorsum   Length (cm):  3 Repair type:    Repair type:  Simple Treatment:    Area cleansed with:  Saline Skin repair:    Repair method:  Steri-Strips   Number of Steri-Strips:  3 Approximation:    Approximation:  Close   (including critical care time)  Medications Ordered in ED Medications - No data to display   Initial Impression / Assessment and Plan / ED Course  I have reviewed the triage vital signs and the nursing notes.  Pertinent labs & imaging results that were available during my care of the patient were reviewed by me and considered in my medical decision making (see chart for details).     Patient with wounds limited to skin tears of right dorsal hand, steri-stripped with adequate closure. Tetanus is UTD.   Final Clinical Impressions(s) / ED Diagnoses   Final diagnoses:  None   1. Skin tears, right hand  ED Discharge Orders    None       Dennie Bible 03/21/18 0103    Jola Schmidt, MD 03/21/18 [phone]

## 2018-03-24 ENCOUNTER — Other Ambulatory Visit: Payer: Self-pay | Admitting: Internal Medicine

## 2018-03-24 ENCOUNTER — Ambulatory Visit: Payer: Medicare Other | Admitting: Internal Medicine

## 2018-04-21 DIAGNOSIS — I872 Venous insufficiency (chronic) (peripheral): Secondary | ICD-10-CM | POA: Diagnosis not present

## 2018-04-21 DIAGNOSIS — L089 Local infection of the skin and subcutaneous tissue, unspecified: Secondary | ICD-10-CM | POA: Diagnosis not present

## 2018-04-21 DIAGNOSIS — L080 Pyoderma: Secondary | ICD-10-CM | POA: Diagnosis not present

## 2018-04-30 DIAGNOSIS — C44722 Squamous cell carcinoma of skin of right lower limb, including hip: Secondary | ICD-10-CM | POA: Diagnosis not present

## 2018-04-30 DIAGNOSIS — Z85828 Personal history of other malignant neoplasm of skin: Secondary | ICD-10-CM | POA: Diagnosis not present

## 2018-05-01 ENCOUNTER — Other Ambulatory Visit: Payer: Self-pay | Admitting: Internal Medicine

## 2018-05-02 ENCOUNTER — Telehealth: Payer: Self-pay | Admitting: Internal Medicine

## 2018-05-02 MED ORDER — FUROSEMIDE 40 MG PO TABS: 80.0000 mg | ORAL_TABLET | Freq: Every day | ORAL | 0 refills | Status: DC

## 2018-05-02 NOTE — Telephone Encounter (Signed)
Copied from [place] [id number]. Topic: Quick Communication - Rx Refill/Question >> May 02, 2018  2:54 PM Boyd Kerbs wrote: Medication:  furosemide (LASIX) 40 MG tablet  She just had skin graft done and was told by her dr. She can not get out of chair to come to dr. Gabriel Carina now.   She made appt. On 8/12 and said this is the first time she can come in, but needs that prescription refilled.    Has the patient contacted their pharmacy? Yes.   (Agent: If no, request that the patient contact the pharmacy for the refill.) (Agent: If yes, when and what did the pharmacy advise?)  Preferred Pharmacy (with phone number or street name):  [facility] - [place], [address] AT [facility] [address] [place] [address] Phone: [phone] Fax: [phone]    Agent: Please be advised that RX refills may take up to 3 business days. We ask that you follow-up with your pharmacy.

## 2018-05-02 NOTE — Telephone Encounter (Signed)
Per office policy sent 30 day to local pharmacy until appt.../lmb  

## 2018-05-20 DIAGNOSIS — L03114 Cellulitis of left upper limb: Secondary | ICD-10-CM | POA: Diagnosis not present

## 2018-05-20 DIAGNOSIS — R600 Localized edema: Secondary | ICD-10-CM | POA: Diagnosis not present

## 2018-05-20 DIAGNOSIS — M2550 Pain in unspecified joint: Secondary | ICD-10-CM | POA: Diagnosis not present

## 2018-05-20 DIAGNOSIS — M150 Primary generalized (osteo)arthritis: Secondary | ICD-10-CM | POA: Diagnosis not present

## 2018-05-20 DIAGNOSIS — M0579 Rheumatoid arthritis with rheumatoid factor of multiple sites without organ or systems involvement: Secondary | ICD-10-CM | POA: Diagnosis not present

## 2018-06-05 ENCOUNTER — Other Ambulatory Visit: Payer: Self-pay | Admitting: Internal Medicine

## 2018-06-10 ENCOUNTER — Other Ambulatory Visit: Payer: Self-pay | Admitting: Internal Medicine

## 2018-06-22 NOTE — Progress Notes (Signed)
Subjective:    Patient ID: Gloria Warren, female    DOB: July 26, 1931, 84 y.o.   MRN: [id number]  HPI The patient is here for follow up.  Squamous cell carcinoma:  Biopsy on RLE and had complications.  She had an infection, swelling and bleeding.  The third antibiotic worked.  She had Moh's surgery.  She is healing very slowly.  Because of her swelling she keeps the leg wrapped.  She was taken off her RA medication.  She is following closely with derm.    Hypertension, CKD, leg edema: She is taking her medication daily. She is compliant with a low sodium diet.  She has chronic leg edema and is taking lasix 80 mg daily.  She denies chest pain, palpitations, shortness of breath and regular headaches. She is not exercising regularly.      GERD:  She is not taking her medication daily as prescribed.  She denies any GERD symptoms and feels her GERD is well controlled w/o the medication.   Prediabetes:  She is compliant with a low sugar/carbohydrate diet.  She is not exercising regularly.  Leg edema:  She has chronic leg edema.  The right leg was worse after the skin cancer.    RA:  She follows with Dr Amil Amen.  She had to go off of her medications due to her leg issues - her pain has not changed much.  She is taking cymbalta and it is helping a little.    Medications and allergies reviewed with patient and updated if appropriate.  Patient Active Problem List   Diagnosis Date Noted  . Osteoarthritis of both feet 09/25/2016  . Raynaud disease 09/25/2016  . High risk medication use 09/24/2016  . Primary osteoarthritis of both knees 09/24/2016  . Primary osteoarthritis of both hands 09/24/2016  . Chronic kidney disease (CKD) stage G3b 09/24/2016  . Leg edema 11/28/2015  . GERD (gastroesophageal reflux disease) 11/28/2015  . Psoriasis 03/11/2013  . SKIN CANCER, HX OF 06/26/2010  . Allergic rhinitis 06/23/2009  . Prediabetes 06/23/2009  . Rheumatoid arthritis ([place]) 02/25/2008  .  Hyperlipidemia 11/17/2007  . Essential hypertension 11/17/2007    Current Outpatient Medications on File Prior to Visit  Medication Sig Dispense Refill  . acetaminophen (TYLENOL) 325 MG tablet Take 650 mg by mouth every 6 (six) hours as needed for mild pain.     . benazepril (LOTENSIN) 10 MG tablet TAKE 1 TABLET(10 MG) BY MOUTH DAILY 90 tablet 0  . Calcium Carbonate (CALTRATE 600 PO) Take 600 mg by mouth 2 (two) times daily.      . chlorhexidine (PERIDEX) 0.12 % solution 1 mL by Mouth Rinse route daily.    . cholecalciferol (VITAMIN D) 1000 units tablet Take 1,000 Units by mouth daily.    Marland Kitchen Fexofenadine HCl (ALLEGRA PO) Take 180 mg by mouth at bedtime.     . furosemide (LASIX) 40 MG tablet TAKE 2 TABLETS(80 MG) BY MOUTH DAILY 60 tablet 0  . hydroxychloroquine (PLAQUENIL) 200 MG tablet Take 200 mg by mouth 2 (two) times daily.   0  . Multiple Vitamins-Minerals (VISION-VITE PRESERVE PO) Take by mouth.    . predniSONE (DELTASONE) 20 MG tablet Take 10 mg by mouth daily with breakfast.     . promethazine (PHENERGAN) 12.5 MG tablet Take 1 tablet (12.5 mg total) by mouth every 8 (eight) hours as needed for nausea or vomiting. 30 tablet 3  . ranitidine (ZANTAC) 150 MG tablet TAKE 1 TABLET(150 MG) BY  MOUTH AT BEDTIME 90 tablet 3  . DULoxetine (CYMBALTA) 30 MG capsule TK 1 C PO BID  3  . HYDROcodone-acetaminophen (NORCO/VICODIN) 5-325 MG tablet Take 1 tablet by mouth every 6 (six) hours as needed.  0   No current facility-administered medications on file prior to visit.     Past Medical History:  Diagnosis Date  . Allergic rhinitis   . Decreased GFR   . Endometriosis   . History of skin cancer    squamous & basal cell ;Dr Denna Haggard  . Hyperlipidemia   . Hypertension   . Osteoarthritis   . Psoriasis   . Raynaud disease   . Rheumatoid arthritis(714.0)    Dr. Estanislado Pandy  . UTI (lower urinary tract infection) 5/15   Proteus , R to Nitrofurantoin & Penicillin    Past Surgical History:    Procedure Laterality Date  . APPENDECTOMY     age 58  . CATARACT EXTRACTION Bilateral   . COLONOSCOPY     Neg 2; [place] , [place]  . G 2  P 2    . TONSILLECTOMY    . TOTAL ABDOMINAL HYSTERECTOMY     age 7 for endometriosis (no BSO)    Social History   Socioeconomic History  . Marital status: Married    Spouse name: Not on file  . Number of children: Not on file  . Years of education: Not on file  . Highest education level: Not on file  Occupational History  . Not on file  Social Needs  . Financial resource strain: Not on file  . Food insecurity:    Worry: Not on file    Inability: Not on file  . Transportation needs:    Medical: Not on file    Non-medical: Not on file  Tobacco Use  . Smoking status: Never Smoker  . Smokeless tobacco: Never Used  Substance and Sexual Activity  . Alcohol use: Yes    Alcohol/week: 0.0 standard drinks    Comment:  rarely  . Drug use: No  . Sexual activity: Yes    Partners: Male    Birth control/protection: Surgical    Comment: TAH  Lifestyle  . Physical activity:    Days per week: Not on file    Minutes per session: Not on file  . Stress: Not on file  Relationships  . Social connections:    Talks on phone: Not on file    Gets together: Not on file    Attends religious service: Not on file    Active member of club or organization: Not on file    Attends meetings of clubs or organizations: Not on file    Relationship status: Not on file  Other Topics Concern  . Not on file  Social History Narrative  . Not on file    Family History  Problem Relation Age of Onset  . Breast cancer Mother 37  . Hypertension Mother   . Gout Mother   . Hypertension Father   . Heart attack Father 63  . Stroke Sister        doing well  . Diabetes Maternal Aunt   . Hypertension Son   . Stroke Maternal Grandmother        late 69s    Review of Systems  Constitutional: Negative for chills and fever.  Respiratory: Negative for cough,  shortness of breath and wheezing.   Cardiovascular: Positive for leg swelling. Negative for chest pain and palpitations.  Gastrointestinal: Positive for nausea.  Negative for abdominal pain, constipation and diarrhea.  Neurological: Positive for headaches (occasionally). Negative for light-headedness.       Objective:   Vitals:   06/23/18 1508  BP: 122/72  Pulse: 97  Resp: 16  Temp: 98.4 F (36.9 C)  SpO2: 98%   BP Readings from Last 3 Encounters:  06/23/18 122/72  03/21/18 115/65  09/04/17 124/76   Wt Readings from Last 3 Encounters:  09/04/17 144 lb (65.3 kg)  03/06/17 135 lb (61.2 kg)  12/09/16 131 lb (59.4 kg)   There is no height or weight on file to calculate BMI.   Physical Exam    Constitutional: Appears well-developed and well-nourished. No distress.  HENT:  Head: Normocephalic and atraumatic.  Neck: Neck supple. No tracheal deviation present. No thyromegaly present.  No cervical lymphadenopathy Cardiovascular: Normal rate, regular rhythm and normal heart sounds.   No murmur heard. No carotid bruit .  2 + b/l LE edema Pulmonary/Chest: Effort normal and breath sounds normal. No respiratory distress. No has no wheezes. No rales.  Skin: Skin is warm and dry. Not diaphoretic.  Psychiatric: Normal mood and affect. Behavior is normal.      Assessment & Plan:    See Problem List for Assessment and Plan of chronic medical problems.

## 2018-06-23 ENCOUNTER — Other Ambulatory Visit (INDEPENDENT_AMBULATORY_CARE_PROVIDER_SITE_OTHER): Payer: Medicare Other

## 2018-06-23 ENCOUNTER — Ambulatory Visit (INDEPENDENT_AMBULATORY_CARE_PROVIDER_SITE_OTHER): Payer: Medicare Other | Admitting: Internal Medicine

## 2018-06-23 ENCOUNTER — Encounter: Payer: Self-pay | Admitting: Internal Medicine

## 2018-06-23 VITALS — BP 122/72 | HR 97 | Temp 98.4°F | Resp 16

## 2018-06-23 DIAGNOSIS — R600 Localized edema: Secondary | ICD-10-CM

## 2018-06-23 DIAGNOSIS — N183 Chronic kidney disease, stage 3 (moderate): Secondary | ICD-10-CM | POA: Diagnosis not present

## 2018-06-23 DIAGNOSIS — E782 Mixed hyperlipidemia: Secondary | ICD-10-CM

## 2018-06-23 DIAGNOSIS — N1832 Chronic kidney disease, stage 3b: Secondary | ICD-10-CM

## 2018-06-23 DIAGNOSIS — I10 Essential (primary) hypertension: Secondary | ICD-10-CM

## 2018-06-23 DIAGNOSIS — K219 Gastro-esophageal reflux disease without esophagitis: Secondary | ICD-10-CM | POA: Diagnosis not present

## 2018-06-23 DIAGNOSIS — R7303 Prediabetes: Secondary | ICD-10-CM

## 2018-06-23 LAB — CBC WITH DIFFERENTIAL/PLATELET
BASOS ABS: 0.1 10*3/uL (ref 0.0–0.1)
Basophils Relative: 0.8 % (ref 0.0–3.0)
Eosinophils Absolute: 0.1 10*3/uL (ref 0.0–0.7)
Eosinophils Relative: 0.7 % (ref 0.0–5.0)
HEMATOCRIT: 33.3 % — AB (ref 36.0–46.0)
Hemoglobin: 11 g/dL — ABNORMAL LOW (ref 12.0–15.0)
LYMPHS PCT: 8.1 % — AB (ref 12.0–46.0)
Lymphs Abs: 0.6 10*3/uL — ABNORMAL LOW (ref 0.7–4.0)
MCHC: 33 g/dL (ref 30.0–36.0)
MCV: 93.3 fl (ref 78.0–100.0)
MONOS PCT: 8 % (ref 3.0–12.0)
Monocytes Absolute: 0.6 10*3/uL (ref 0.1–1.0)
NEUTROS ABS: 5.9 10*3/uL (ref 1.4–7.7)
Neutrophils Relative %: 82.4 % — ABNORMAL HIGH (ref 43.0–77.0)
PLATELETS: 272 10*3/uL (ref 150.0–400.0)
RBC: 3.57 Mil/uL — ABNORMAL LOW (ref 3.87–5.11)
RDW: 14.6 % (ref 11.5–15.5)
WBC: 7.2 10*3/uL (ref 4.0–10.5)

## 2018-06-23 LAB — COMPREHENSIVE METABOLIC PANEL
ALT: 11 U/L (ref 0–35)
AST: 16 U/L (ref 0–37)
Albumin: 3.7 g/dL (ref 3.5–5.2)
Alkaline Phosphatase: 52 U/L (ref 39–117)
BILIRUBIN TOTAL: 0.5 mg/dL (ref 0.2–1.2)
BUN: 56 mg/dL — ABNORMAL HIGH (ref 6–23)
CALCIUM: 9.6 mg/dL (ref 8.4–10.5)
CO2: 29 mEq/L (ref 19–32)
Chloride: 102 mEq/L (ref 96–112)
Creatinine, Ser: 1.93 mg/dL — ABNORMAL HIGH (ref 0.40–1.20)
GFR: 26.22 mL/min — AB (ref >60.00)
Glucose, Bld: 115 mg/dL — ABNORMAL HIGH (ref 70–99)
Potassium: 4.4 mEq/L (ref 3.5–5.1)
Sodium: 139 mEq/L (ref 135–145)
Total Protein: 6.9 g/dL (ref 6.0–8.3)

## 2018-06-23 LAB — HEMOGLOBIN A1C: HEMOGLOBIN A1C: 5.5 % (ref 4.6–6.5)

## 2018-06-23 LAB — TSH: TSH: 1.53 u[IU]/mL (ref 0.35–4.50)

## 2018-06-23 NOTE — Assessment & Plan Note (Signed)
Not taking  Denies gerd Monitor off medication

## 2018-06-23 NOTE — Assessment & Plan Note (Addendum)
Will hold off on check lipids Not on a statin healthy diet encouraged

## 2018-06-23 NOTE — Patient Instructions (Signed)
  Test(s) ordered today. Your results will be released to MyChart (or called to you) after review, usually within 72hours after test completion. If any changes need to be made, you will be notified at that same time.  Medications reviewed and updated.  No changes recommended at this time.    Please followup in 6 months   

## 2018-06-23 NOTE — Assessment & Plan Note (Signed)
cmp

## 2018-06-23 NOTE — Assessment & Plan Note (Signed)
BP well controlled Current regimen effective and well tolerated Continue current medications at current doses cmp  

## 2018-06-23 NOTE — Assessment & Plan Note (Signed)
Check a1c Low sugar / carb diet   

## 2018-06-23 NOTE — Assessment & Plan Note (Signed)
Chronic Lasix 80 mg daily Wrapping right leg Will start wearing compression socks

## 2018-06-24 ENCOUNTER — Other Ambulatory Visit: Payer: Self-pay | Admitting: Internal Medicine

## 2018-06-24 DIAGNOSIS — N183 Chronic kidney disease, stage 3 (moderate): Principal | ICD-10-CM

## 2018-06-24 DIAGNOSIS — N1832 Chronic kidney disease, stage 3b: Secondary | ICD-10-CM

## 2018-07-02 ENCOUNTER — Other Ambulatory Visit (INDEPENDENT_AMBULATORY_CARE_PROVIDER_SITE_OTHER): Payer: Medicare Other

## 2018-07-02 DIAGNOSIS — E782 Mixed hyperlipidemia: Secondary | ICD-10-CM | POA: Diagnosis not present

## 2018-07-02 DIAGNOSIS — R600 Localized edema: Secondary | ICD-10-CM | POA: Diagnosis not present

## 2018-07-02 DIAGNOSIS — I10 Essential (primary) hypertension: Secondary | ICD-10-CM | POA: Diagnosis not present

## 2018-07-02 DIAGNOSIS — R7303 Prediabetes: Secondary | ICD-10-CM

## 2018-07-02 DIAGNOSIS — N183 Chronic kidney disease, stage 3 (moderate): Secondary | ICD-10-CM

## 2018-07-02 DIAGNOSIS — N1832 Chronic kidney disease, stage 3b: Secondary | ICD-10-CM

## 2018-07-02 LAB — BASIC METABOLIC PANEL
BUN: 49 mg/dL — AB (ref 6–23)
CHLORIDE: 104 meq/L (ref 96–112)
CO2: 28 mEq/L (ref 19–32)
Calcium: 9.5 mg/dL (ref 8.4–10.5)
Creatinine, Ser: 1.59 mg/dL — ABNORMAL HIGH (ref 0.40–1.20)
GFR: 32.79 mL/min — ABNORMAL LOW (ref >60.00)
GLUCOSE: 117 mg/dL — AB (ref 70–99)
POTASSIUM: 4.4 meq/L (ref 3.5–5.1)
SODIUM: 139 meq/L (ref 135–145)

## 2018-07-02 LAB — COMPREHENSIVE METABOLIC PANEL
ALBUMIN: 3.5 g/dL (ref 3.5–5.2)
ALK PHOS: 53 U/L (ref 39–117)
ALT: 11 U/L (ref 0–35)
AST: 15 U/L (ref 0–37)
BILIRUBIN TOTAL: 0.4 mg/dL (ref 0.2–1.2)
BUN: 49 mg/dL — AB (ref 6–23)
CO2: 28 mEq/L (ref 19–32)
Calcium: 9.5 mg/dL (ref 8.4–10.5)
Chloride: 104 mEq/L (ref 96–112)
Creatinine, Ser: 1.59 mg/dL — ABNORMAL HIGH (ref 0.40–1.20)
GFR: 32.79 mL/min — AB (ref >60.00)
Glucose, Bld: 117 mg/dL — ABNORMAL HIGH (ref 70–99)
Potassium: 4.4 mEq/L (ref 3.5–5.1)
Sodium: 139 mEq/L (ref 135–145)
TOTAL PROTEIN: 6.5 g/dL (ref 6.0–8.3)

## 2018-07-26 ENCOUNTER — Other Ambulatory Visit: Payer: Self-pay | Admitting: Internal Medicine

## 2018-07-28 DIAGNOSIS — M2550 Pain in unspecified joint: Secondary | ICD-10-CM | POA: Diagnosis not present

## 2018-07-28 DIAGNOSIS — L03114 Cellulitis of left upper limb: Secondary | ICD-10-CM | POA: Diagnosis not present

## 2018-07-28 DIAGNOSIS — R600 Localized edema: Secondary | ICD-10-CM | POA: Diagnosis not present

## 2018-07-28 DIAGNOSIS — N183 Chronic kidney disease, stage 3 (moderate): Secondary | ICD-10-CM | POA: Diagnosis not present

## 2018-07-28 DIAGNOSIS — M0579 Rheumatoid arthritis with rheumatoid factor of multiple sites without organ or systems involvement: Secondary | ICD-10-CM | POA: Diagnosis not present

## 2018-07-28 DIAGNOSIS — M150 Primary generalized (osteo)arthritis: Secondary | ICD-10-CM | POA: Diagnosis not present

## 2018-08-14 ENCOUNTER — Encounter: Payer: Self-pay | Admitting: Internal Medicine

## 2018-08-14 ENCOUNTER — Ambulatory Visit (INDEPENDENT_AMBULATORY_CARE_PROVIDER_SITE_OTHER): Payer: Medicare Other

## 2018-08-14 DIAGNOSIS — Z23 Encounter for immunization: Secondary | ICD-10-CM

## 2018-08-14 NOTE — Progress Notes (Signed)
Patient came in to receive her flu vaccine. Patient received the vaccine in her left deltoid & tolerated the injection well. No signs/symptoms of a reaction prior to leaving the exam room. VIS given to patient.

## 2018-08-18 ENCOUNTER — Other Ambulatory Visit: Payer: Self-pay | Admitting: Internal Medicine

## 2018-08-19 ENCOUNTER — Other Ambulatory Visit: Payer: Self-pay | Admitting: Internal Medicine

## 2018-08-19 NOTE — Telephone Encounter (Signed)
Copied from [place] [id number]. Topic: Quick Communication - See Telephone Encounter >> Aug 19, 2018 10:47 AM Marja Kays F wrote: Pt is needing her prednisone called in to [facility] number [phone]

## 2018-08-19 NOTE — Telephone Encounter (Signed)
Poke with Lenell Antu at [facility], and she states that the refill for promethazine was refilled at 1042 08/19/18; she also states that the prescription will be ready after 1415 today; pt notified and verbalized understanding.

## 2018-08-19 NOTE — Telephone Encounter (Signed)
Contacted the pt regarding request for prednisone, she states that she is not requesting prednisone, but needs promethazine; she would like for it to be called in to [facility], [place].

## 2018-09-04 ENCOUNTER — Other Ambulatory Visit: Payer: Self-pay | Admitting: Internal Medicine

## 2018-09-04 ENCOUNTER — Encounter: Payer: Self-pay | Admitting: Internal Medicine

## 2018-09-04 DIAGNOSIS — R600 Localized edema: Secondary | ICD-10-CM | POA: Diagnosis not present

## 2018-09-04 DIAGNOSIS — L03114 Cellulitis of left upper limb: Secondary | ICD-10-CM | POA: Diagnosis not present

## 2018-09-04 DIAGNOSIS — M0579 Rheumatoid arthritis with rheumatoid factor of multiple sites without organ or systems involvement: Secondary | ICD-10-CM | POA: Diagnosis not present

## 2018-09-04 DIAGNOSIS — M2550 Pain in unspecified joint: Secondary | ICD-10-CM | POA: Diagnosis not present

## 2018-09-04 DIAGNOSIS — M150 Primary generalized (osteo)arthritis: Secondary | ICD-10-CM | POA: Diagnosis not present

## 2018-09-04 DIAGNOSIS — N183 Chronic kidney disease, stage 3 (moderate): Secondary | ICD-10-CM | POA: Diagnosis not present

## 2018-09-08 DIAGNOSIS — M0579 Rheumatoid arthritis with rheumatoid factor of multiple sites without organ or systems involvement: Secondary | ICD-10-CM | POA: Diagnosis not present

## 2018-09-22 DIAGNOSIS — M0579 Rheumatoid arthritis with rheumatoid factor of multiple sites without organ or systems involvement: Secondary | ICD-10-CM | POA: Diagnosis not present

## 2018-10-06 DIAGNOSIS — M0579 Rheumatoid arthritis with rheumatoid factor of multiple sites without organ or systems involvement: Secondary | ICD-10-CM | POA: Diagnosis not present

## 2018-11-03 DIAGNOSIS — M0579 Rheumatoid arthritis with rheumatoid factor of multiple sites without organ or systems involvement: Secondary | ICD-10-CM | POA: Diagnosis not present

## 2018-11-13 DIAGNOSIS — M150 Primary generalized (osteo)arthritis: Secondary | ICD-10-CM | POA: Diagnosis not present

## 2018-11-13 DIAGNOSIS — M0579 Rheumatoid arthritis with rheumatoid factor of multiple sites without organ or systems involvement: Secondary | ICD-10-CM | POA: Diagnosis not present

## 2018-11-13 DIAGNOSIS — L03114 Cellulitis of left upper limb: Secondary | ICD-10-CM | POA: Diagnosis not present

## 2018-11-13 DIAGNOSIS — M2550 Pain in unspecified joint: Secondary | ICD-10-CM | POA: Diagnosis not present

## 2018-11-13 DIAGNOSIS — N183 Chronic kidney disease, stage 3 (moderate): Secondary | ICD-10-CM | POA: Diagnosis not present

## 2018-11-13 DIAGNOSIS — R600 Localized edema: Secondary | ICD-10-CM | POA: Diagnosis not present

## 2018-12-08 DIAGNOSIS — M0579 Rheumatoid arthritis with rheumatoid factor of multiple sites without organ or systems involvement: Secondary | ICD-10-CM | POA: Diagnosis not present

## 2019-01-05 DIAGNOSIS — M0579 Rheumatoid arthritis with rheumatoid factor of multiple sites without organ or systems involvement: Secondary | ICD-10-CM | POA: Diagnosis not present

## 2019-02-16 ENCOUNTER — Other Ambulatory Visit: Payer: Self-pay | Admitting: Internal Medicine

## 2019-03-05 ENCOUNTER — Other Ambulatory Visit: Payer: Self-pay | Admitting: Internal Medicine

## 2019-06-04 ENCOUNTER — Other Ambulatory Visit: Payer: Self-pay | Admitting: Internal Medicine

## 2019-06-10 ENCOUNTER — Other Ambulatory Visit: Payer: Self-pay

## 2019-06-22 ENCOUNTER — Telehealth: Payer: Self-pay | Admitting: Internal Medicine

## 2019-06-22 NOTE — Telephone Encounter (Signed)
Spoke with Gloria Warren regarding AWV. Patient stated that she will give office a call to schedule her wellness appointment.

## 2019-06-28 NOTE — Patient Instructions (Addendum)
Tests ordered today. Your results will be released to [place] (or called to you) after review.  If any changes need to be made, you will be notified at that same time.  We can consider trying gabapentin for your headaches from your neck - this is a nerve pain medication.  Medications reviewed and updated.  Changes include :  none     Please followup in 8 months    Cervicogenic Headache  A cervicogenic headache is a headache caused by a condition that affects the bones and tissues in your neck (cervical spine). In a cervicogenic headache, the pain moves from your neck to your head. Most cervicogenic headaches start in the upper part of the neck with the first three cervical bones (cervical vertebrae). A cervicogenic headache is diagnosed when a cause can be found in the cervical spine and other causes of headaches can be ruled out. What are the causes? The most common cause of this condition is a traumatic injury to the cervical spine, such as whiplash. Other causes include:  Arthritis.  Broken bone (fracture).  Infection.  Tumor. What are the signs or symptoms? The most common symptoms are neck and head pain. The pain is often located on one side. In some cases, there may be head pain without neck pain. Pain may be felt in the neck, back or side of the head, face, or behind the eyes. Other symptoms include:  Limited movement in the neck.  Arm or shoulder pain. How is this diagnosed? This condition may be diagnosed based on:  Your symptoms.  A physical exam.  An injection that blocks nerve signals (nerve block).  Imaging tests, such as: ? X-rays. ? CT scan. ? MRI. How is this treated? Treatment for this condition may depend on the underlying condition. Treatment may include:  Medicines, such as: ? NSAIDs. ? Muscle relaxants.  Physical therapy.  Massage therapy.  Complementary therapies, such as: ? Biofeedback. ? Meditation. ? Acupuncture.  Nerve block  injections.  Botulinum toxin injections. Your treatment plan may involve working with a pain management team that includes your primary health care provider, a pain management specialist, a neurologist, and a physical therapist. Follow these instructions at home:  Take over-the-counter and prescription medicines only as told by your health care provider.  Do exercises at home as told by your physical therapist.  Return to your normal activities as told by your health care provider. Ask your health care provider what activities are safe for you. Avoid activities that trigger your headaches.  Maintain good neck support and posture at home and at work.  Keep all follow-up visits as told by your health care provider. This is important. Contact a health care provider if you have:  Headaches that are getting worse and happening more often.  Headaches with any of the following: ? Fever. ? Numbness. ? Weakness. ? Dizziness. ? Nausea or vomiting. Get help right away if:  You have a very sudden and severe headache. Summary  A cervicogenic headache is a headache caused by a condition that affects the bones and tissues in your cervical spine.  Your health care provider may diagnose this condition with a physical exam, a nerve block, and imaging tests.  Treatment may include medicine to reduce pain and inflammation, physical therapy, and nerve block injections.  Complementary therapies, such as acupuncture and meditation, may be added to other treatments.  Your treatment plan may involve working with a pain management team that includes your primary health  care provider, a pain management specialist, a neurologist, and a physical therapist. This information is not intended to replace advice given to you by your health care provider. Make sure you discuss any questions you have with your health care provider. Document Released: 01/19/2004 Document Revised: 02/18/2019 Document Reviewed:  11/08/2017 Elsevier Patient Education  2020 [employer].

## 2019-06-28 NOTE — Progress Notes (Signed)
Subjective:    Patient ID: Gloria Warren, female    DOB: Jun 28, 1931, 85 y.o.   MRN: [id number]  HPI The patient is here for follow up.  She is not exercising regularly.     Headaches:  She has freq headaches.  She can jerk or move her head/neck and she will have pain shoot up her head and she will have a headache that lasts for 2-3 days.  advil helps a little.  She tries not to move her head quickly, but that is not always possible.  She feels that she is having these headaches more and more and feel most constant at this point.  CKD, leg edema, Hypertension: She is taking her medication daily-she has decreased her furosemide to 40 mg daily.  Her leg swelling is controlled she is compliant with a low sodium diet.  She denies chest pain. She does not monitor her blood pressure at home.     nausea: She denies any GERD.  She thinks her nausea is from hydroxychloroquine.  She takes the antinausea as needed and it does help.  Prediabetes:  She is compliant with a low sugar/carbohydrate diet.  She is not exercising regularly.  RA:  She follows with Dr Amil Amen.  She is taking prednisone and Plaquenil..      Medications and allergies reviewed with patient and updated if appropriate.  Patient Active Problem List   Diagnosis Date Noted  . Osteoarthritis of both feet 09/25/2016  . Raynaud disease 09/25/2016  . High risk medication use 09/24/2016  . Primary osteoarthritis of both knees 09/24/2016  . Primary osteoarthritis of both hands 09/24/2016  . Chronic kidney disease (CKD) stage G3b 09/24/2016  . Leg edema 11/28/2015  . GERD (gastroesophageal reflux disease) 11/28/2015  . Psoriasis 03/11/2013  . SKIN CANCER, HX OF 06/26/2010  . Allergic rhinitis 06/23/2009  . Prediabetes 06/23/2009  . Rheumatoid arthritis ([place]) 02/25/2008  . Hyperlipidemia 11/17/2007  . Essential hypertension 11/17/2007    Current Outpatient Medications on File Prior to Visit  Medication Sig Dispense  Refill  . benazepril (LOTENSIN) 10 MG tablet Take 1 tablet (10 mg total) by mouth daily. Need office visit for more refills. 30 tablet 0  . Calcium Carbonate (CALTRATE 600 PO) Take 600 mg by mouth 2 (two) times daily.      . cholecalciferol (VITAMIN D) 1000 units tablet Take 1,000 Units by mouth daily.    . DULoxetine (CYMBALTA) 30 MG capsule TK 1 C PO BID  3  . Fexofenadine HCl (ALLEGRA PO) Take 180 mg by mouth at bedtime.     . furosemide (LASIX) 40 MG tablet TAKE 2 TABLETS(80 MG) BY MOUTH DAILY 180 tablet 1  . hydroxychloroquine (PLAQUENIL) 200 MG tablet Take 200 mg by mouth 2 (two) times daily.   0  . Multiple Vitamins-Minerals (VISION-VITE PRESERVE PO) Take by mouth.    . predniSONE (DELTASONE) 20 MG tablet Take 10 mg by mouth daily with breakfast.     . promethazine (PHENERGAN) 12.5 MG tablet TAKE 1 TABLET(12.5 MG) BY MOUTH EVERY 8 HOURS AS NEEDED FOR NAUSEA OR VOMITING 30 tablet 0   No current facility-administered medications on file prior to visit.     Past Medical History:  Diagnosis Date  . Allergic rhinitis   . Decreased GFR   . Endometriosis   . History of skin cancer    squamous & basal cell ;Dr Denna Haggard  . Hyperlipidemia   . Hypertension   . Osteoarthritis   .  Psoriasis   . Raynaud disease   . Rheumatoid arthritis(714.0)    Dr. Estanislado Pandy  . UTI (lower urinary tract infection) 5/15   Proteus , R to Nitrofurantoin & Penicillin    Past Surgical History:  Procedure Laterality Date  . APPENDECTOMY     age 61  . CATARACT EXTRACTION Bilateral   . COLONOSCOPY     Neg 2; [place] , [place]  . G 2  P 2    . TONSILLECTOMY    . TOTAL ABDOMINAL HYSTERECTOMY     age 81 for endometriosis (no BSO)    Social History   Socioeconomic History  . Marital status: Married    Spouse name: Not on file  . Number of children: Not on file  . Years of education: Not on file  . Highest education level: Not on file  Occupational History  . Not on file  Social Needs  . Financial  resource strain: Not on file  . Food insecurity    Worry: Not on file    Inability: Not on file  . Transportation needs    Medical: Not on file    Non-medical: Not on file  Tobacco Use  . Smoking status: Never Smoker  . Smokeless tobacco: Never Used  Substance and Sexual Activity  . Alcohol use: Yes    Alcohol/week: 0.0 standard drinks    Comment:  rarely  . Drug use: No  . Sexual activity: Yes    Partners: Male    Birth control/protection: Surgical    Comment: TAH  Lifestyle  . Physical activity    Days per week: Not on file    Minutes per session: Not on file  . Stress: Not on file  Relationships  . Social Herbalist on phone: Not on file    Gets together: Not on file    Attends religious service: Not on file    Active member of club or organization: Not on file    Attends meetings of clubs or organizations: Not on file    Relationship status: Not on file  Other Topics Concern  . Not on file  Social History Narrative  . Not on file    Family History  Problem Relation Age of Onset  . Breast cancer Mother 61  . Hypertension Mother   . Gout Mother   . Hypertension Father   . Heart attack Father 64  . Stroke Sister        doing well  . Diabetes Maternal Aunt   . Hypertension Son   . Stroke Maternal Grandmother        late 41s    Review of Systems  Constitutional: Negative for chills and fever.  Respiratory: Positive for shortness of breath (with exertion). Negative for cough and wheezing.   Cardiovascular: Positive for palpitations (sometimes - stress related) and leg swelling. Negative for chest pain.  Musculoskeletal: Positive for arthralgias and neck pain.  Neurological: Positive for light-headedness (occ - when she first gets up) and headaches.       Objective:   Vitals:   06/29/19 1327  BP: 118/64  Pulse: 94  Resp: 18  Temp: 98.9 F (37.2 C)  SpO2: 96%   BP Readings from Last 3 Encounters:  06/29/19 118/64  06/23/18 122/72   03/21/18 115/65   Wt Readings from Last 3 Encounters:  09/04/17 144 lb (65.3 kg)  03/06/17 135 lb (61.2 kg)  12/09/16 131 lb (59.4 kg)   Body mass index  is 28.12 kg/m.   Physical Exam    Constitutional: Appears well-developed and well-nourished. No distress.  HENT:  Head: Normocephalic and atraumatic.  Neck: Neck supple. No tracheal deviation present. No thyromegaly present.  No cervical lymphadenopathy Cardiovascular: Normal rate, regular rhythm and normal heart sounds.  No murmur heard. No carotid bruit .  1+ bilateral lower extremity edema-wearing compression socks Pulmonary/Chest: Effort normal and breath sounds normal. No respiratory distress. No has no wheezes. No rales.  Skin: Skin is warm and dry. Not diaphoretic.  Psychiatric: Normal mood and affect. Behavior is normal.      Assessment & Plan:    See Problem List for Assessment and Plan of chronic medical problems.

## 2019-06-29 ENCOUNTER — Other Ambulatory Visit (INDEPENDENT_AMBULATORY_CARE_PROVIDER_SITE_OTHER): Payer: Medicare Other

## 2019-06-29 ENCOUNTER — Encounter: Payer: Self-pay | Admitting: Internal Medicine

## 2019-06-29 ENCOUNTER — Ambulatory Visit (INDEPENDENT_AMBULATORY_CARE_PROVIDER_SITE_OTHER): Payer: Medicare Other | Admitting: Internal Medicine

## 2019-06-29 ENCOUNTER — Other Ambulatory Visit: Payer: Self-pay

## 2019-06-29 VITALS — BP 118/64 | HR 94 | Temp 98.9°F | Resp 18 | Ht 60.0 in

## 2019-06-29 DIAGNOSIS — R7303 Prediabetes: Secondary | ICD-10-CM

## 2019-06-29 DIAGNOSIS — E559 Vitamin D deficiency, unspecified: Secondary | ICD-10-CM | POA: Diagnosis not present

## 2019-06-29 DIAGNOSIS — I10 Essential (primary) hypertension: Secondary | ICD-10-CM

## 2019-06-29 DIAGNOSIS — R600 Localized edema: Secondary | ICD-10-CM | POA: Diagnosis not present

## 2019-06-29 DIAGNOSIS — K219 Gastro-esophageal reflux disease without esophagitis: Secondary | ICD-10-CM | POA: Diagnosis not present

## 2019-06-29 DIAGNOSIS — M0579 Rheumatoid arthritis with rheumatoid factor of multiple sites without organ or systems involvement: Secondary | ICD-10-CM | POA: Diagnosis not present

## 2019-06-29 DIAGNOSIS — N183 Chronic kidney disease, stage 3 (moderate): Secondary | ICD-10-CM | POA: Diagnosis not present

## 2019-06-29 DIAGNOSIS — R110 Nausea: Secondary | ICD-10-CM

## 2019-06-29 DIAGNOSIS — N1832 Chronic kidney disease, stage 3b: Secondary | ICD-10-CM

## 2019-06-29 LAB — COMPREHENSIVE METABOLIC PANEL
ALT: 24 U/L (ref 0–35)
AST: 24 U/L (ref 0–37)
Albumin: 3.6 g/dL (ref 3.5–5.2)
Alkaline Phosphatase: 65 U/L (ref 39–117)
BUN: 61 mg/dL — ABNORMAL HIGH (ref 6–23)
CO2: 28 mEq/L (ref 19–32)
Calcium: 9.3 mg/dL (ref 8.4–10.5)
Chloride: 101 mEq/L (ref 96–112)
Creatinine, Ser: 1.68 mg/dL — ABNORMAL HIGH (ref 0.40–1.20)
GFR: 28.88 mL/min — ABNORMAL LOW (ref >60.00)
Glucose, Bld: 114 mg/dL — ABNORMAL HIGH (ref 70–99)
Potassium: 4.7 mEq/L (ref 3.5–5.1)
Sodium: 138 mEq/L (ref 135–145)
Total Bilirubin: 0.4 mg/dL (ref 0.2–1.2)
Total Protein: 6.4 g/dL (ref 6.0–8.3)

## 2019-06-29 LAB — HEMOGLOBIN A1C: Hgb A1c MFr Bld: 5.8 % (ref 4.6–6.5)

## 2019-06-29 LAB — CBC WITH DIFFERENTIAL/PLATELET
Basophils Absolute: 0 10*3/uL (ref 0.0–0.1)
Basophils Relative: 0.5 % (ref 0.0–3.0)
Eosinophils Absolute: 0 10*3/uL (ref 0.0–0.7)
Eosinophils Relative: 0 % (ref 0.0–5.0)
HCT: 31.7 % — ABNORMAL LOW (ref 36.0–46.0)
Hemoglobin: 10.3 g/dL — ABNORMAL LOW (ref 12.0–15.0)
Lymphocytes Relative: 7.1 % — ABNORMAL LOW (ref 12.0–46.0)
Lymphs Abs: 0.6 10*3/uL — ABNORMAL LOW (ref 0.7–4.0)
MCHC: 32.4 g/dL (ref 30.0–36.0)
MCV: 88.8 fl (ref 78.0–100.0)
Monocytes Absolute: 0.6 10*3/uL (ref 0.1–1.0)
Monocytes Relative: 6.4 % (ref 3.0–12.0)
Neutro Abs: 7.8 10*3/uL — ABNORMAL HIGH (ref 1.4–7.7)
Neutrophils Relative %: 86 % — ABNORMAL HIGH (ref 43.0–77.0)
Platelets: 323 10*3/uL (ref 150.0–400.0)
RBC: 3.57 Mil/uL — ABNORMAL LOW (ref 3.87–5.11)
RDW: 15.9 % — ABNORMAL HIGH (ref 11.5–15.5)
WBC: 9.1 10*3/uL (ref 4.0–10.5)

## 2019-06-29 LAB — VITAMIN D 25 HYDROXY (VIT D DEFICIENCY, FRACTURES): VITD: 47.37 ng/mL (ref 30.00–100.00)

## 2019-06-29 MED ORDER — FUROSEMIDE 40 MG PO TABS: 40.0000 mg | ORAL_TABLET | Freq: Every day | ORAL | 1 refills | Status: DC

## 2019-06-29 MED ORDER — PROMETHAZINE HCL 12.5 MG PO TABS: ORAL_TABLET | ORAL | 5 refills | Status: DC

## 2019-06-29 NOTE — Assessment & Plan Note (Signed)
BP well controlled Current regimen effective and well tolerated Continue current medications at current doses CMP 

## 2019-06-29 NOTE — Assessment & Plan Note (Signed)
CMP, CBC

## 2019-06-29 NOTE — Assessment & Plan Note (Signed)
Frequent nausea likely from hydroxychloroquine Promethazine as needed

## 2019-06-29 NOTE — Assessment & Plan Note (Signed)
Taking vitamin D daily Last check at her rheumatologist's office vitamin D level is low Will recheck level today

## 2019-06-29 NOTE — Assessment & Plan Note (Signed)
Continue low sugar/carb diet Check A1c

## 2019-06-29 NOTE — Assessment & Plan Note (Signed)
Following with Dr. Amil Amen She had started Orencia prior to COVID, but had to stop it-May restart when it is safer to go in and out of his office. Taking low-dose prednisone, Plaquenil and hydrocodone as needed

## 2019-06-29 NOTE — Assessment & Plan Note (Signed)
Controlled with Lasix 40 mg daily Continue CMP

## 2019-07-02 ENCOUNTER — Other Ambulatory Visit: Payer: Self-pay | Admitting: Internal Medicine

## 2019-07-12 ENCOUNTER — Other Ambulatory Visit: Payer: Self-pay | Admitting: Internal Medicine

## 2019-07-29 NOTE — Progress Notes (Signed)
Virtual Visit via Video Note  I connected with Gloria Warren on 07/30/19 at 10:00 AM EDT by a video enabled telemedicine application and verified that I am speaking with the correct person using two identifiers.   I discussed the limitations of evaluation and management by telemedicine and the availability of in person appointments. The patient expressed understanding and agreed to proceed.  The patient is currently at home and I am in the office.    No referring provider.    History of Present Illness: This is an acute visit for consideration of home health services.   She is severe rheumatoid arthritis that is disabling.  Her husband was just recently placed in the hospital and he is apparently very ill.  He was helping her with all of her activities of daily living.  Because of her rheumatoid arthritis she needs assistance with getting into and out of bed, bathing, getting up and down, changing, getting into and out of the bathroom, preparing medications, preparing meals, doing some things around the home and getting to appointments.  She is able to eat on her own.  She does have family members that are helping out, but they are not able to help her out long-term.  Her joint pain and joint deformities are extremely limiting.  She is unable to walk long distances and unable to perform her ADLs as stated above.  In addition to the rheumatoid arthritis she does have osteoarthritis.  She does not have any other concerning symptoms or concerns.  She of course is extremely worried about her husband.  Hypertension: She is taking her medication as prescribed.  Prediabetes: She tries to limit her sugars and carbohydrates.  She is not able to exercise regularly.  Review of Systems  Constitutional: Negative for chills and fever.  Respiratory: Positive for shortness of breath. Negative for cough and wheezing.   Cardiovascular: Positive for palpitations and leg swelling. Negative for chest pain.   Neurological: Positive for headaches.     Social History   Socioeconomic History  . Marital status: Married    Spouse name: Not on file  . Number of children: Not on file  . Years of education: Not on file  . Highest education level: Not on file  Occupational History  . Not on file  Social Needs  . Financial resource strain: Not on file  . Food insecurity    Worry: Not on file    Inability: Not on file  . Transportation needs    Medical: Not on file    Non-medical: Not on file  Tobacco Use  . Smoking status: Never Smoker  . Smokeless tobacco: Never Used  Substance and Sexual Activity  . Alcohol use: Yes    Alcohol/week: 0.0 standard drinks    Comment:  rarely  . Drug use: No  . Sexual activity: Yes    Partners: Male    Birth control/protection: Surgical    Comment: TAH  Lifestyle  . Physical activity    Days per week: Not on file    Minutes per session: Not on file  . Stress: Not on file  Relationships  . Social Herbalist on phone: Not on file    Gets together: Not on file    Attends religious service: Not on file    Active member of club or organization: Not on file    Attends meetings of clubs or organizations: Not on file    Relationship status: Not on file  Other Topics Concern  . Not on file  Social History Narrative  . Not on file     Observations/Objective: Appears well in NAD, upset when talking about her husband and has 6 years Breathing normally   Assessment and Plan:  See Problem List for Assessment and Plan of chronic medical problems.   Follow Up Instructions:    I discussed the assessment and treatment plan with the patient. The patient was provided an opportunity to ask questions and all were answered. The patient agreed with the plan and demonstrated an understanding of the instructions.   The patient was advised to call back or seek an in-person evaluation if the symptoms worsen or if the condition fails to improve as  anticipated.    Binnie Rail, MD

## 2019-07-30 ENCOUNTER — Encounter: Payer: Self-pay | Admitting: Internal Medicine

## 2019-07-30 ENCOUNTER — Ambulatory Visit (INDEPENDENT_AMBULATORY_CARE_PROVIDER_SITE_OTHER): Payer: Medicare Other | Admitting: Internal Medicine

## 2019-07-30 ENCOUNTER — Telehealth: Payer: Self-pay | Admitting: Internal Medicine

## 2019-07-30 DIAGNOSIS — M0579 Rheumatoid arthritis with rheumatoid factor of multiple sites without organ or systems involvement: Secondary | ICD-10-CM | POA: Diagnosis not present

## 2019-07-30 NOTE — Assessment & Plan Note (Signed)
Following with rheumatology Rheumatoid arthritis is severe and disabling She is not able to perform almost all of her ADLs without assistance Her husband was just recently admitted to the hospital and he is the one that helps with all of her ADLs.  She is in need of home health aide or assistance at home to help with her ADLs Will request home health aide services

## 2019-07-30 NOTE — Telephone Encounter (Signed)
Spoke with patient's grand-daughter and info given.

## 2019-07-30 NOTE — Telephone Encounter (Signed)
Please call patient-talk to her or a family member.  I had a virtual visit with her today and we discussed getting home health aide services.  Let her know I spoke with our referral coordinator and her insurance will not pay for home health services to assist with activities of daily living.  She can apply for a grant to the department of social services for in-home aide services the number for this will be [phone].  The only other option is paying out of pocket.  Medicare will not pay for any home aide services.

## 2019-08-06 DIAGNOSIS — M542 Cervicalgia: Secondary | ICD-10-CM | POA: Diagnosis not present

## 2019-08-06 DIAGNOSIS — M2550 Pain in unspecified joint: Secondary | ICD-10-CM | POA: Diagnosis not present

## 2019-08-06 DIAGNOSIS — R600 Localized edema: Secondary | ICD-10-CM | POA: Diagnosis not present

## 2019-08-06 DIAGNOSIS — M150 Primary generalized (osteo)arthritis: Secondary | ICD-10-CM | POA: Diagnosis not present

## 2019-08-06 DIAGNOSIS — N183 Chronic kidney disease, stage 3 (moderate): Secondary | ICD-10-CM | POA: Diagnosis not present

## 2019-08-06 DIAGNOSIS — L03114 Cellulitis of left upper limb: Secondary | ICD-10-CM | POA: Diagnosis not present

## 2019-08-06 DIAGNOSIS — M0579 Rheumatoid arthritis with rheumatoid factor of multiple sites without organ or systems involvement: Secondary | ICD-10-CM | POA: Diagnosis not present

## 2019-08-07 ENCOUNTER — Other Ambulatory Visit: Payer: Self-pay | Admitting: Internal Medicine

## 2019-08-07 DIAGNOSIS — M0579 Rheumatoid arthritis with rheumatoid factor of multiple sites without organ or systems involvement: Secondary | ICD-10-CM

## 2019-08-14 ENCOUNTER — Telehealth: Payer: Self-pay | Admitting: Internal Medicine

## 2019-08-14 ENCOUNTER — Telehealth: Payer: Self-pay

## 2019-08-14 DIAGNOSIS — M0579 Rheumatoid arthritis with rheumatoid factor of multiple sites without organ or systems involvement: Secondary | ICD-10-CM

## 2019-08-14 NOTE — Telephone Encounter (Signed)
Copied from [place]. Topic: General - Other >> Aug 14, 2019  4:57 PM Yvette Rack wrote: Reason for CRM: Pt son [name] stated pt needs a Rx for a lift chair.

## 2019-08-14 NOTE — Telephone Encounter (Signed)
Order printed

## 2019-08-14 NOTE — Telephone Encounter (Signed)
Copied from [place] [id number]. Topic: General - Other >> Aug 14, 2019 10:50 AM Yvette Rack wrote: Reason for CRM: Pt son [name] stated pt needs a letter from pcp for pt to receive in home long term health care. Cb# [phone]

## 2019-08-14 NOTE — Telephone Encounter (Signed)
Spoke with son to get more clarification. He spoke with insurance company and they will be sending a form to fill out for in home care for pt. Son will get [place] the form needed. I told him we would be looking out for it.

## 2019-08-14 NOTE — Addendum Note (Signed)
Addended by: Binnie Rail on: 08/14/2019 05:03 PM   Modules accepted: Orders

## 2019-08-17 NOTE — Telephone Encounter (Signed)
Rx faxed to [facility].

## 2019-08-20 ENCOUNTER — Other Ambulatory Visit: Payer: Self-pay | Admitting: Internal Medicine

## 2019-08-20 DIAGNOSIS — M0579 Rheumatoid arthritis with rheumatoid factor of multiple sites without organ or systems involvement: Secondary | ICD-10-CM

## 2019-08-21 ENCOUNTER — Ambulatory Visit (INDEPENDENT_AMBULATORY_CARE_PROVIDER_SITE_OTHER): Payer: Medicare Other | Admitting: Family Medicine

## 2019-08-21 ENCOUNTER — Other Ambulatory Visit: Payer: Self-pay

## 2019-08-21 ENCOUNTER — Encounter: Payer: Self-pay | Admitting: Family Medicine

## 2019-08-21 VITALS — BP 118/70 | HR 122 | Ht 60.0 in

## 2019-08-21 DIAGNOSIS — J309 Allergic rhinitis, unspecified: Secondary | ICD-10-CM

## 2019-08-21 DIAGNOSIS — J45998 Other asthma: Secondary | ICD-10-CM

## 2019-08-21 DIAGNOSIS — Z23 Encounter for immunization: Secondary | ICD-10-CM | POA: Diagnosis not present

## 2019-08-21 DIAGNOSIS — J069 Acute upper respiratory infection, unspecified: Secondary | ICD-10-CM | POA: Diagnosis not present

## 2019-08-21 DIAGNOSIS — Z0279 Encounter for issue of other medical certificate: Secondary | ICD-10-CM

## 2019-08-21 MED ORDER — PREDNISONE 20 MG PO TABS: ORAL_TABLET | ORAL | 0 refills | Status: DC

## 2019-08-21 MED ORDER — ALBUTEROL SULFATE (2.5 MG/3ML) 0.083% IN NEBU: 2.5000 mg | INHALATION_SOLUTION | Freq: Four times a day (QID) | RESPIRATORY_TRACT | 1 refills | Status: DC | PRN

## 2019-08-21 NOTE — Patient Instructions (Addendum)
Take prednisone taper as directed Use albuterol nebulizer every 6 hrs as needed for wheezing or shortness of breath Use nasal saline spray 2-3x/day Cont with daily nasal spray and allegra Can add mucinex 1 tab twice per day Drink plenty of water Follow-up if symptoms worsen or do not improve in 7-10 days

## 2019-08-21 NOTE — Progress Notes (Signed)
Gloria Warren is a 85 y.o. female  Chief Complaint  Patient presents with  . Acute Visit  . Wheezing    After tuesday   . sinus drainage  . Cough    HPI: Gloria Warren is a 85 y.o. female patient of Dr. Billey Gosling who presents today for an acute visit with complaints of PND and cough x 4-5 days and wheezing that started sometime in the past 2 days. + nasal congestion. Denies SOB, CP, fatigue, myalgias, sore throat. Denies fever, chills. Pt endorses headache but this is not new and she notes upcoming appt with neurology. She has not yet had her flu vaccine this year.  She has RA, follows with Dr. Amil Amen, and is on plaquenil 200mg  BID and prednisone 7.5mg  daily for her RA.   Past Medical History:  Diagnosis Date  . Allergic rhinitis   . Decreased GFR   . Endometriosis   . History of skin cancer    squamous & basal cell ;Dr Denna Haggard  . Hyperlipidemia   . Hypertension   . Osteoarthritis   . Psoriasis   . Raynaud disease   . Rheumatoid arthritis(714.0)    Dr. Estanislado Pandy  . UTI (lower urinary tract infection) 5/15   Proteus , R to Nitrofurantoin & Penicillin    Past Surgical History:  Procedure Laterality Date  . APPENDECTOMY     age 34  . CATARACT EXTRACTION Bilateral   . COLONOSCOPY     Neg 2; [place] , [place]  . G 2  P 2    . TONSILLECTOMY    . TOTAL ABDOMINAL HYSTERECTOMY     age 86 for endometriosis (no BSO)    Social History   Socioeconomic History  . Marital status: Married    Spouse name: Not on file  . Number of children: Not on file  . Years of education: Not on file  . Highest education level: Not on file  Occupational History  . Not on file  Social Needs  . Financial resource strain: Not on file  . Food insecurity    Worry: Not on file    Inability: Not on file  . Transportation needs    Medical: Not on file    Non-medical: Not on file  Tobacco Use  . Smoking status: Never Smoker  . Smokeless tobacco: Never Used  Substance and Sexual  Activity  . Alcohol use: Yes    Alcohol/week: 0.0 standard drinks    Comment:  rarely  . Drug use: No  . Sexual activity: Yes    Partners: Male    Birth control/protection: Surgical    Comment: TAH  Lifestyle  . Physical activity    Days per week: Not on file    Minutes per session: Not on file  . Stress: Not on file  Relationships  . Social Herbalist on phone: Not on file    Gets together: Not on file    Attends religious service: Not on file    Active member of club or organization: Not on file    Attends meetings of clubs or organizations: Not on file    Relationship status: Not on file  . Intimate partner violence    Fear of current or ex partner: Not on file    Emotionally abused: Not on file    Physically abused: Not on file    Forced sexual activity: Not on file  Other Topics Concern  . Not on file  Social History Narrative  . Not on file    Family History  Problem Relation Age of Onset  . Breast cancer Mother 57  . Hypertension Mother   . Gout Mother   . Hypertension Father   . Heart attack Father 68  . Stroke Sister        doing well  . Diabetes Maternal Aunt   . Hypertension Son   . Stroke Maternal Grandmother        late 47s     Immunization History  Administered Date(s) Administered  . Influenza Split 08/08/2012  . Influenza Whole 09/09/2007  . Influenza, High Dose Seasonal PF 08/15/2015, 07/31/2016, 08/14/2017, 08/14/2018  . Influenza,inj,Quad PF,6+ Mos 08/07/2013, 08/16/2014  . Pneumococcal Polysaccharide-23 11/01/2011  . Tetanus 03/24/2014  . Zoster 09/18/2007    Outpatient Encounter Medications as of 08/21/2019  Medication Sig Note  . benazepril (LOTENSIN) 10 MG tablet TAKE 1 TABLET(10 MG) BY MOUTH DAILY   . Calcium Carbonate (CALTRATE 600 PO) Take 600 mg by mouth 2 (two) times daily.     . cholecalciferol (VITAMIN D) 1000 units tablet Take 1,000 Units by mouth daily.   Marland Kitchen Fexofenadine HCl (ALLEGRA PO) Take 180 mg by mouth at  bedtime.    . furosemide (LASIX) 40 MG tablet Take 1 tablet (40 mg total) by mouth daily.   Marland Kitchen HYDROcodone-acetaminophen (NORCO/VICODIN) 5-325 MG tablet TAKE ONE TABLET BY MOUTH EVERY 6 HOURS PRN   . hydroxychloroquine (PLAQUENIL) 200 MG tablet Take 200 mg by mouth 2 (two) times daily.  12/09/2016: --  . Multiple Vitamins-Minerals (VISION-VITE PRESERVE PO) Take by mouth.   . predniSONE (DELTASONE) 20 MG tablet Take 10 mg by mouth daily with breakfast.    . [DISCONTINUED] promethazine (PHENERGAN) 12.5 MG tablet TAKE 1 TABLET(12.5 MG) BY MOUTH EVERY 8 HOURS AS NEEDED FOR NAUSEA OR VOMITING (Patient not taking: Reported on 08/21/2019)    No facility-administered encounter medications on file as of 08/21/2019.      ROS: Pertinent positives and negatives noted in HPI. Remainder of ROS non-contributory  Allergies  Allergen Reactions  . Arava [Leflunomide]     Liver & kidney dysfunction  . Sulfonamide Derivatives     Itching & rash  . Remicade [Infliximab] Hives and Itching    BP 118/70   Pulse (!) 122   Ht 5' (1.524 m)   SpO2 96%   BMI 28.12 kg/m   Physical Exam  Constitutional: She is oriented to person, place, and time. She appears well-developed and well-nourished. No distress.  HENT:  Right Ear: Tympanic membrane and ear canal normal.  Left Ear: Tympanic membrane and ear canal normal.  Nose: Rhinorrhea present. No mucosal edema. Right sinus exhibits no maxillary sinus tenderness and no frontal sinus tenderness. Left sinus exhibits no maxillary sinus tenderness and no frontal sinus tenderness.  Mouth/Throat: Mucous membranes are normal. Posterior oropharyngeal erythema present. No oropharyngeal exudate or posterior oropharyngeal edema.  Neck: Neck supple.  Cardiovascular: Normal rate and regular rhythm.  Pulmonary/Chest: Effort normal. No accessory muscle usage or stridor. No respiratory distress. She has wheezes (B/L upper lobes). She has no rhonchi. She has no rales.   Lymphadenopathy:    She has no cervical adenopathy.  Neurological: She is alert and oriented to person, place, and time.     A/P:  1. Need for influenza vaccination - Flu Vaccine QUAD High Dose(Fluad)  2. Post-viral reactive airway disease 3. Viral URI with cough 4. Allergic rhinitis, unspecified seasonality, unspecified trigger - supportive care  to include increased water intake, rest, nasal saline spray 2-3x/day  - cont allegra and daily nasal steroid spray Rx: - predniSONE (DELTASONE) 20 MG tablet; 3 tabs po x 3 days, 2 tabs po x 3 days, 1 tab po x 3 days, 1/2 tabs po x 3 days  Dispense: 20 tablet; Refill: 0 - pt will hold her daily 7.5mg  prednisone while taking taper Rx: - albuterol (PROVENTIL) (2.5 MG/3ML) 0.083% nebulizer solution; Take 3 mLs (2.5 mg total) by nebulization every 6 (six) hours as needed for wheezing.  Dispense: 75 mL; Refill: 1 - For home use only DME Nebulizer machine - f/u if symptoms worsen or do not improve in 7-10 days Discussed plan and reviewed medications with patient, including risks, benefits, and potential side effects. Pt expressed understand. All questions answered.

## 2019-08-25 ENCOUNTER — Telehealth: Payer: Self-pay

## 2019-08-25 NOTE — Telephone Encounter (Signed)
Copied from [place] [phone]. Topic: General - Inquiry >> Aug 25, 2019 10:13 AM Richardo Priest, NT wrote: Reason for CRM: Patient's granddaughter called in to check on status of paperwork for long term care facility. Please advise, call back is [phone]

## 2019-08-25 NOTE — Telephone Encounter (Signed)
Called back to let them know that form is complete and was faxed to the fax number listed on the form on 08/21/19. Granddaughter will call back with a fax number to fax form to for them to receive.

## 2019-08-28 ENCOUNTER — Ambulatory Visit
Admission: RE | Admit: 2019-08-28 | Discharge: 2019-08-28 | Disposition: A | Discharge status: Home / Self Care | Payer: Medicare Other | Source: Ambulatory Visit | Attending: Internal Medicine | Admitting: Internal Medicine

## 2019-08-28 DIAGNOSIS — M4802 Spinal stenosis, cervical region: Secondary | ICD-10-CM | POA: Diagnosis not present

## 2019-08-28 DIAGNOSIS — M0579 Rheumatoid arthritis with rheumatoid factor of multiple sites without organ or systems involvement: Secondary | ICD-10-CM

## 2019-09-17 NOTE — Telephone Encounter (Signed)
Pt son is stating that the form sent over was not signed by Dr Quay Burow so listed as incomplete, pls refax # to 1- [place]

## 2019-09-18 ENCOUNTER — Telehealth: Payer: Self-pay

## 2019-09-18 DIAGNOSIS — G8929 Other chronic pain: Secondary | ICD-10-CM

## 2019-09-18 NOTE — Telephone Encounter (Signed)
Referral ordered - I thought Dr Amil Amen was going to refer...Marland KitchenMarland Kitchen

## 2019-09-18 NOTE — Telephone Encounter (Signed)
Copied from [place] [id number]. Topic: Quick Communication - See Telephone Encounter >> Sep 17, 2019  4:15 PM Loma Boston wrote: CRM for notification. See Telephone encounter for: 09/17/19. Pt son called wanting to speak with Dr B nurse about continuing the process of the referral for neuro with mom, was contacted but mother can not hear but about every 1 of 5 words so son is following up as mom is having continual headaches/ His # is [place]

## 2019-09-18 NOTE — Telephone Encounter (Signed)
I do not see where a referral was put in for neuro. I guess they need one.

## 2019-09-18 NOTE — Telephone Encounter (Signed)
Son aware.

## 2019-09-18 NOTE — Telephone Encounter (Signed)
Form was refaxed. Not sure why they said it was not signed. It did have Dr. Quay Burow signature on there from 08/21/19.

## 2019-09-23 ENCOUNTER — Telehealth: Payer: Self-pay

## 2019-09-23 NOTE — Telephone Encounter (Signed)
Copied from [place] [id number]. Topic: Appointment Scheduling - Scheduling Inquiry for Clinic >> Sep 23, 2019  9:06 AM Scherrie Gerlach wrote: Reason for CRM:  son is calling to speak with the nurse for Dr Quay Burow.  Pt states her right calf is tender and pt is concerned she has phlebitis.  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 He ask I relay the message and see what the dr want to do.

## 2019-09-23 NOTE — Telephone Encounter (Signed)
Can do a phone visit - it would be ideal to see the leg, but it is not easy for her to get here and her husband is on hospice.

## 2019-09-23 NOTE — Telephone Encounter (Signed)
Phone visit set up for tomorrow morning

## 2019-09-23 NOTE — Telephone Encounter (Signed)
OV

## 2019-09-24 ENCOUNTER — Other Ambulatory Visit: Payer: Self-pay

## 2019-09-24 ENCOUNTER — Ambulatory Visit (INDEPENDENT_AMBULATORY_CARE_PROVIDER_SITE_OTHER): Payer: Medicare Other | Admitting: Internal Medicine

## 2019-09-24 ENCOUNTER — Encounter (HOSPITAL_COMMUNITY): Payer: Self-pay | Admitting: Radiology

## 2019-09-24 ENCOUNTER — Encounter: Payer: Self-pay | Admitting: Cardiovascular Disease

## 2019-09-24 ENCOUNTER — Telehealth: Payer: Self-pay

## 2019-09-24 ENCOUNTER — Encounter: Payer: Self-pay | Admitting: Internal Medicine

## 2019-09-24 ENCOUNTER — Ambulatory Visit (INDEPENDENT_AMBULATORY_CARE_PROVIDER_SITE_OTHER): Payer: Medicare Other | Admitting: Cardiovascular Disease

## 2019-09-24 ENCOUNTER — Ambulatory Visit (HOSPITAL_COMMUNITY)
Admission: RE | Admit: 2019-09-24 | Discharge: 2019-09-24 | Disposition: A | Discharge status: Home / Self Care | Payer: Medicare Other | Source: Ambulatory Visit | Attending: Cardiovascular Disease | Admitting: Cardiovascular Disease

## 2019-09-24 VITALS — BP 103/58 | HR 79 | Temp 97.3°F | Ht 60.0 in | Wt 129.0 lb

## 2019-09-24 DIAGNOSIS — M7989 Other specified soft tissue disorders: Secondary | ICD-10-CM

## 2019-09-24 DIAGNOSIS — I82421 Acute embolism and thrombosis of right iliac vein: Secondary | ICD-10-CM

## 2019-09-24 DIAGNOSIS — M79661 Pain in right lower leg: Secondary | ICD-10-CM | POA: Diagnosis not present

## 2019-09-24 DIAGNOSIS — N1832 Chronic kidney disease, stage 3b: Secondary | ICD-10-CM

## 2019-09-24 DIAGNOSIS — M069 Rheumatoid arthritis, unspecified: Secondary | ICD-10-CM

## 2019-09-24 DIAGNOSIS — M4712 Other spondylosis with myelopathy, cervical region: Secondary | ICD-10-CM

## 2019-09-24 DIAGNOSIS — Z7952 Long term (current) use of systemic steroids: Secondary | ICD-10-CM | POA: Diagnosis not present

## 2019-09-24 MED ORDER — APIXABAN 5 MG PO TABS: 5.0000 mg | ORAL_TABLET | Freq: Two times a day (BID) | ORAL | 11 refills | Status: DC

## 2019-09-24 NOTE — Progress Notes (Signed)
Cardiology Consultation Note:    Date:  09/24/2019   ID:  Gloria Warren, DOB 10/02/1931, MRN [id number]  PCP:  Gloria Rail, MD  Cardiologist:  No primary care provider on file.  Electrophysiologist:  None   Referring MD: Gloria Rail, MD   Chief Complaint  Patient presents with  . Edema  DVT R iliac vein  Gloria Warren is a 85 y.o. female who is being seen today for the evaluation of DVT at the request of Burns, Claudina Lick, MD.  History of Present Illness:    Gloria Warren is a 85 y.o. female with a hx of rheumatoid arthritis with multiple joint involvement including cervical spine disease.  Lower extremity venous duplex ultrasonography, ordered by Dr. Billey Gosling was performed in our office today and shows to be fresh thrombus extending from the calf veins all the way up to the right proximal external iliac vein.  Some mentioned is acute nonocclusive thrombus in the proximal greater saphenous vein on the left.  Over the last approximately 5 days she is experienced worsening swelling and tenderness in her right lower extremity.  She has substantial edema to the level of the lower thigh not relieved by compression stockings.  The leg is warm and there is some erythema.  She has a scar of previous surgery in that area.  The left ankle is a little swollen, but the degree of asymmetry is very pronounced.  She has not had cough, hemoptysis, shortness of breath or pleuritic chest pain.  She denies palpitations, dizziness and syncope.  She is very sedentary.  She denies fever, chills or sick exposure.  Because of complaints of neck pain and headaches, Dr. Amil Amen ordered an MRI of the cervical spine performed on October 16.  Following that study, an urgent evaluation by neurosurgery was recommended.  The patient is scheduled to see Dr. Venetia Constable at 3 PM tomorrow.  The patient and her family seem to believe that there is C1-C2 level spinal compression, but upon my review of the MRI  of the spine problems are primarily located at C5-C6 and C6-C7 where there is evidence of spinal canal stenosis and spinal cord flattening as well as moderate right foraminal narrowing and severe left foraminal narrowing, primarily due to posterior disc osteophytes.  There was evidence of C1-C2 ligamentous hypertrophy/pannus.  Gloria Warren is under tremendous emotional distress, since her husband of 62 years is currently admitted to hospice at [facility] with a very poor short-term prognosis.  She does not want to be hospitalized when he passes.  She wants to be able to attend his funeral.  Past Medical History:  Diagnosis Date  . Allergic rhinitis   . Decreased GFR   . Endometriosis   . History of skin cancer    squamous & basal cell ;Dr Denna Haggard  . Hyperlipidemia   . Hypertension   . Osteoarthritis   . Psoriasis   . Raynaud disease   . Rheumatoid arthritis(714.0)    Dr. Estanislado Pandy  . UTI (lower urinary tract infection) 5/15   Proteus , R to Nitrofurantoin & Penicillin    Past Surgical History:  Procedure Laterality Date  . APPENDECTOMY     age 45  . CATARACT EXTRACTION Bilateral   . COLONOSCOPY     Neg 2; [place] , [place]  . G 2  P 2    . TONSILLECTOMY    . TOTAL ABDOMINAL HYSTERECTOMY     age 57 for endometriosis (no BSO)  Current Medications: Current Meds  Medication Sig  . albuterol (PROVENTIL) (2.5 MG/3ML) 0.083% nebulizer solution Take 3 mLs (2.5 mg total) by nebulization every 6 (six) hours as needed for wheezing.  . benazepril (LOTENSIN) 10 MG tablet TAKE 1 TABLET(10 MG) BY MOUTH DAILY  . Calcium Carbonate (CALTRATE 600 PO) Take 600 mg by mouth 2 (two) times daily.    . cholecalciferol (VITAMIN D) 1000 units tablet Take 1,000 Units by mouth daily.  Marland Kitchen Fexofenadine HCl (ALLEGRA PO) Take 180 mg by mouth at bedtime.   . furosemide (LASIX) 40 MG tablet Take 1 tablet (40 mg total) by mouth daily.  Marland Kitchen HYDROcodone-acetaminophen (NORCO/VICODIN) 5-325 MG tablet TAKE ONE  TABLET BY MOUTH EVERY 6 HOURS PRN  . hydroxychloroquine (PLAQUENIL) 200 MG tablet Take 200 mg by mouth 2 (two) times daily.   . Multiple Vitamins-Minerals (VISION-VITE PRESERVE PO) Take by mouth.  . predniSONE (DELTASONE) 20 MG tablet Take 10 mg by mouth daily with breakfast.      Allergies:   Arava [leflunomide], Sulfonamide derivatives, and Remicade [infliximab]   Social History   Socioeconomic History  . Marital status: Married    Spouse name: Not on file  . Number of children: Not on file  . Years of education: Not on file  . Highest education level: Not on file  Occupational History  . Not on file  Social Needs  . Financial resource strain: Not on file  . Food insecurity    Worry: Not on file    Inability: Not on file  . Transportation needs    Medical: Not on file    Non-medical: Not on file  Tobacco Use  . Smoking status: Never Smoker  . Smokeless tobacco: Never Used  Substance and Sexual Activity  . Alcohol use: Yes    Alcohol/week: 0.0 standard drinks    Comment:  rarely  . Drug use: No  . Sexual activity: Yes    Partners: Male    Birth control/protection: Surgical    Comment: TAH  Lifestyle  . Physical activity    Days per week: Not on file    Minutes per session: Not on file  . Stress: Not on file  Relationships  . Social Herbalist on phone: Not on file    Gets together: Not on file    Attends religious service: Not on file    Active member of club or organization: Not on file    Attends meetings of clubs or organizations: Not on file    Relationship status: Not on file  Other Topics Concern  . Not on file  Social History Narrative  . Not on file     Family History: The patient's family history includes Breast cancer (age of onset: 75) in her mother; Diabetes in her maternal aunt; Gout in her mother; Heart attack (age of onset: 68) in her father; Hypertension in her father, mother, and son; Stroke in her maternal grandmother and sister.   ROS:   Please see the history of present illness.    All other systems reviewed and are negative.  EKGs/Labs/Other Studies Reviewed:    The following studies were reviewed today: Duplex venous ultrasound of the lower extremities performed in our office today.  Results reported above.  EKG:  EKG is ordered today.  The ekg ordered today demonstrates normal sinus rhythm, nonspecific T wave changes.  No ischemic abnormalities.  QTc 421 ms.  Similar to the study from 2014.  Recent Labs: 06/29/2019:  ALT 24; BUN 61; Creatinine, Ser 1.68; Hemoglobin 10.3; Platelets 323.0; Potassium 4.7; Sodium 138  Recent Lipid Panel    Component Value Date/Time   CHOL 214 (H) 06/21/2015 0953   CHOL 140 03/24/2014 1107   TRIG 137 06/21/2015 0953   TRIG 113 03/24/2014 1107   HDL 57 06/21/2015 0953   HDL 68 03/24/2014 1107   CHOLHDL 3.8 06/21/2015 0953   VLDL 27 06/21/2015 0953   LDLCALC 130 (H) 06/21/2015 0953   LDLCALC 49 03/24/2014 1107   LDLDIRECT 77.2 02/25/2008 0839    Physical Exam:    VS:  BP (!) 103/58   Pulse 79   Temp (!) 97.3 F (36.3 C)   Ht 5' (1.524 m)   Wt 129 lb (58.5 kg)   SpO2 97%   BMI 25.19 kg/m     Wt Readings from Last 3 Encounters:  09/24/19 129 lb (58.5 kg)  09/04/17 144 lb (65.3 kg)  03/06/17 135 lb (61.2 kg)     GEN: Elderly, frail.  Mild cushingoid facies well nourished, well developed in no acute distress HEENT: Normal NECK: No JVD; No carotid bruits LYMPHATICS: No lymphadenopathy CARDIAC: RRR, no murmurs, rubs, gallops RESPIRATORY:  Clear to auscultation without rales, wheezing or rhonchi  ABDOMEN: Soft, non-tender, non-distended MUSCULOSKELETAL: 1+ left ankle and pedal edema; 3+ pitting edema of the right foot, ankle and calf, with slight redness and warmth of the skin; No deformity  SKIN: Warm and dry NEUROLOGIC:  Alert and oriented x 3 PSYCHIATRIC: Depressed affect, cried several times.  ASSESSMENT:    1. Pain and swelling of right lower leg   2.  Acute deep vein thrombosis (DVT) of iliac vein of right lower extremity (HCC)   3. Cervical spondylosis with myelopathy   4. Rheumatoid arthritis involving multiple sites, unspecified whether rheumatoid factor present ([place])   5. Stage 3b chronic kidney disease   6. Current chronic use of systemic steroids    PLAN:    In order of problems listed above:  1. DVT: She has a very proximal thrombus and I have advised hospitalization, due to the risk of pulmonary embolism.  I told her and her son that such an event could well be fatal.  They refused hospitalization, both due to the impending neurosurgical appointment tomorrow and due to the possibility that the patient's husband might pass away in the next few days.  I explained several times that she is unlikely to survive a large pulmonary embolism.  If she needs cervical spine surgery, she will require an inferior vena cava filter.  I doubt that either surgery or an IVC filter will occur in the next 24 hours, so given her refusal to be hospitalized I started Eliquis 10 mg twice daily for 1 week followed by Eliquis 5 mg daily.  At this time of day I was unable to reach interventional radiology to see if an IVC filter can be organized. 2. Anticoagulation: Bleeding risk is increased due to her age, generally poor functional status, moderate chronic kidney disease and chronic steroid therapy.  Nevertheless the risk of a massive pulmonary embolism outweighs the risk of bleeding in my opinion. 3. Cervical spine disease: I am by no means a neurosurgical or radiology specialist, but as I review her cervical spine MRI, I do not think that surgery is as imminent as the family expects it will be.  She will have to be off anticoagulation for weeks probably, if she has spine surgery.  For that reason, I  do not think an IVC filter can be avoided.  If the neurosurgeon believes that she does not require urgent spine surgery, then I would complete a year of anticoagulation  before such a procedure. 4. RA: she appears to be elderly, frail and debilitated.  She is in a wheelchair.  She is on a low to moderate dose of chronic steroids (prednisone 10 mg daily).  This increases the risk of infectious complications with surgical procedures increases the risk of bleeding with anticoagulation. 5. CKD3b-4: Her creatinine seems to be close to baseline and her GFR is generally right around 30 mL/minute.  This also increases the risk of bleeding complications. 6. Chronic steroid use: She should probably receive stress doses of hydrocortisone if she has major surgical procedures or serious complication such as acute pulmonary embolism.  I once again reiterated my advice for hospitalization, but the patient and her son politely declined.  I asked him to go to the emergency room immediately if she should develop shortness of breath, cough, hemoptysis or pleuritic chest pain.  Events discussed with Dr. Quay Burow.   Medication Adjustments/Labs and Tests Ordered: Current medicines are reviewed at length with the patient today.  Concerns regarding medicines are outlined above.  Orders Placed This Encounter  Procedures  . EKG 12-Lead   Meds ordered this encounter  Medications  . apixaban (ELIQUIS) 5 MG TABS tablet    Sig: Take 1 tablet (5 mg total) by mouth 2 (two) times daily.    Dispense:  60 tablet    Refill:  11    Patient Instructions  Medication Instructions:  START: Eliquis 10 mg twice daily for seven days and then take Eliquis 5 mg twice daily.  -Eliquis 5 mg twice daily has been sent into the pharmacy for you.  *If you need a refill on your cardiac medications before your next appointment, please call your pharmacy*  Lab Work: None ordered If you have labs (blood work) drawn today and your tests are completely normal, you will receive your results only by: Marland Kitchen MyChart Message (if you have MyChart) OR . A paper copy in the mail If you have any lab test that is  abnormal or we need to change your treatment, we will call you to review the results.  Testing/Procedures: None ordered  Follow-Up: At [facility], you and your health needs are our priority.  As part of our continuing mission to provide you with exceptional heart care, we have created designated Provider Care Teams.  These Care Teams include your primary Cardiologist (physician) and Advanced Practice Providers (APPs -  Physician Assistants and Nurse Practitioners) who all work together to provide you with the care you need, when you need it.  Your next appointment:   Follow up with an APP in two weeks    Signed, Sanda Klein, MD  09/24/2019 6:34 PM    [place]

## 2019-09-24 NOTE — Patient Instructions (Signed)
Medication Instructions:  START: Eliquis 10 mg twice daily for seven days and then take Eliquis 5 mg twice daily.  -Eliquis 5 mg twice daily has been sent into the pharmacy for you.  *If you need a refill on your cardiac medications before your next appointment, please call your pharmacy*  Lab Work: None ordered If you have labs (blood work) drawn today and your tests are completely normal, you will receive your results only by: Marland Kitchen MyChart Message (if you have MyChart) OR . A paper copy in the mail If you have any lab test that is abnormal or we need to change your treatment, we will call you to review the results.  Testing/Procedures: None ordered  Follow-Up: At [facility], you and your health needs are our priority.  As part of our continuing mission to provide you with exceptional heart care, we have created designated Provider Care Teams.  These Care Teams include your primary Cardiologist (physician) and Advanced Practice Providers (APPs -  Physician Assistants and Nurse Practitioners) who all work together to provide you with the care you need, when you need it.  Your next appointment:   Follow up with an APP in two weeks

## 2019-09-24 NOTE — Telephone Encounter (Signed)
Received call report from [facility]---this patient has extensive DVT---from external illiac vein to distal femoral vein---dr burns out of office this afternoon, tried to contact her----keisha will have their doctor see the patient before patient goes home, Dr. Sallyanne Kuster will advise patient of what tx she should start---routing to dr burns, fyi.Marland KitchenMarland Kitchen

## 2019-09-24 NOTE — Progress Notes (Unsigned)
Right lower venous has been completed and is positive for DVT. Patient refused ultrasound of the left lower extremity due to pain.  Preliminary results can be found under CV proc through chart review.   Sharlett Iles, RVT [facility]

## 2019-09-24 NOTE — Progress Notes (Addendum)
Virtual Visit via Video Note  I connected with Gloria Warren on 09/24/19 at  8:00 AM EST by a video enabled telemedicine application and verified that I am speaking with the correct person using two identifiers.   I discussed the limitations of evaluation and management by telemedicine and the availability of in person appointments. The patient expressed understanding and agreed to proceed.  Present for the visit:  Myself, Dr Billey Gosling, [name] , her son Gloria Warren and her neice.  The patient is currently at home and I am at home.    No referring provider.    History of Present Illness: This is an acute visit for possible phlebitis.  4- 5 days ago she started to experience swelling and tenderness in her right lower extremity.  She has some bruising around the proximal lower right leg, but that is from her compression socks.  She bruises easily and that is nothing new.  She denies any trauma to the leg.  The leg is warm and there is redness on the skin.  The right leg is approximately 50% bigger than the left leg.  The swelling was worse last night, but she still has significant swelling this morning.  She has not had any fevers or chills.  There is no shortness of breath.  She is fairly sedentary secondary to severe rheumatoid arthritis.      Social History   Socioeconomic History  . Marital status: Married    Spouse name: Not on file  . Number of children: Not on file  . Years of education: Not on file  . Highest education level: Not on file  Occupational History  . Not on file  Social Needs  . Financial resource strain: Not on file  . Food insecurity    Worry: Not on file    Inability: Not on file  . Transportation needs    Medical: Not on file    Non-medical: Not on file  Tobacco Use  . Smoking status: Never Smoker  . Smokeless tobacco: Never Used  Substance and Sexual Activity  . Alcohol use: Yes    Alcohol/week: 0.0 standard drinks    Comment:  rarely  .  Drug use: No  . Sexual activity: Yes    Partners: Male    Birth control/protection: Surgical    Comment: TAH  Lifestyle  . Physical activity    Days per week: Not on file    Minutes per session: Not on file  . Stress: Not on file  Relationships  . Social Herbalist on phone: Not on file    Gets together: Not on file    Attends religious service: Not on file    Active member of club or organization: Not on file    Attends meetings of clubs or organizations: Not on file    Relationship status: Not on file  Other Topics Concern  . Not on file  Social History Narrative  . Not on file     Observations/Objective: Appears well in NAD Breathing normally Right lower extremity with significant swelling, redness and bruising anterior-lateral aspect of proximal right lower extremity, no obvious open wound, difficult to appreciate if erythema extends to the distal leg  Assessment and Plan:  See Problem List for Assessment and Plan of chronic medical problems.   Follow Up Instructions:    I discussed the assessment and treatment plan with the patient. The patient was provided an opportunity to ask questions and all  were answered. The patient agreed with the plan and demonstrated an understanding of the instructions.   The patient was advised to call back or seek an in-person evaluation if the symptoms worsen or if the condition fails to improve as anticipated.    Binnie Rail, MD

## 2019-09-24 NOTE — Assessment & Plan Note (Signed)
4-5 days of right lower extremity swelling, pain, warmth and redness No obvious injury Concern for DVT versus cellulitis Wears compression socks daily Ultrasound of the right lower extremity today to rule out DVT If negative will need to treat for cellulitis

## 2019-09-25 ENCOUNTER — Telehealth: Payer: Self-pay | Admitting: Internal Medicine

## 2019-09-25 NOTE — Progress Notes (Signed)
Briefly discussed the situation with Dr. Servando Snare, Vascular Surgery.  If deemed necessary after her Neurosurgery appointment, he can evaluate Gloria Warren for possible IVC filter on Monday.  I also called her son, Gloria Warren, who informed me that his father (the patient's husband) passed away this morning in Hospice care. As expected, Gloria Warren is quite emotionally distraught.

## 2019-09-25 NOTE — Telephone Encounter (Signed)
Pt's son requesting CB from [facility].  They are wanting the name of the person Dr. Quay Burow recommended to them for pt's surgery? Please advise. CB# [phone]

## 2019-09-25 NOTE — Telephone Encounter (Signed)
Called son back, they are scheduled to see [facility] on Wednesday.  Son also said that his father passed away this morning, FYI.

## 2019-09-25 NOTE — Telephone Encounter (Signed)
noted 

## 2019-09-25 NOTE — Telephone Encounter (Signed)
Can you let pt son know that we sent a referral for headaches to [facility]. They can call them at [phone]

## 2019-09-30 DIAGNOSIS — M5412 Radiculopathy, cervical region: Secondary | ICD-10-CM | POA: Diagnosis not present

## 2019-09-30 DIAGNOSIS — M542 Cervicalgia: Secondary | ICD-10-CM | POA: Diagnosis not present

## 2019-09-30 DIAGNOSIS — G959 Disease of spinal cord, unspecified: Secondary | ICD-10-CM | POA: Diagnosis not present

## 2019-09-30 DIAGNOSIS — M4802 Spinal stenosis, cervical region: Secondary | ICD-10-CM | POA: Diagnosis not present

## 2019-09-30 DIAGNOSIS — R030 Elevated blood-pressure reading, without diagnosis of hypertension: Secondary | ICD-10-CM | POA: Diagnosis not present

## 2019-10-08 NOTE — Progress Notes (Signed)
Virtual Visit via Telephone Note   This visit type was conducted due to national recommendations for restrictions regarding the COVID-19 Pandemic (e.g. social distancing) in an effort to limit this patient's exposure and mitigate transmission in our community.  Due to her co-morbid illnesses, this patient is at least at moderate risk for complications without adequate follow up.  This format is felt to be most appropriate for this patient at this time.  The patient did not have access to video technology/had technical difficulties with video requiring transitioning to audio format only (telephone).  All issues noted in this document were discussed and addressed.  No physical exam could be performed with this format.  Please refer to the patient's chart for her  consent to telehealth for [facility].   Date:  10/12/2019   ID:  Gloria Warren, DOB [birth year], MRN [id number]  Patient Location: Home Provider Location: Home  PCP:  Binnie Rail, MD  Cardiologist:  Gloria Klein, MD  Electrophysiologist:  None   Evaluation Performed:  Follow-Up Visit  Chief Complaint:  DVT, chronic AC  History of Present Illness:    Gloria Warren is a 85 y.o. female with HTN, CKD stage III, HLD, and RA on chronic steroids was evaluated by Dr. Sallyanne Kuster 10-24-19 following a lower extremity duplex ordered by PCP which revealed fresh thrombus extending from the calf veins to the right proximal external iliac. Given that she had a proximal thrombus, Dr. Sallyanne Kuster felt there was risk for PE and advised hospitalization. Pt refused due to pending neurosurgery appt the next day and that her husband might pass away in the next few days. Given her refusal, Dr. Sallyanne Kuster started 10 mg eliquis BID x 1 week followed by 5 mg eliquis BID. Dr. Sallyanne Kuster also reached out to IR to inquire about IVC filter. In the event that her spine surgery could be deferred, he recommended 12 months of anticoagulation therapy.   Dr.  Sallyanne Kuster spoke with Dr. Donzetta Matters who has agreed to evaluate her for IVC filter, if needed. Of note, her husband died in Hospice care on 2019/10/24.   She presents today for virtual visit with her son. She is doing well on her current regimen. She is taking 20 mg lasix daily. Pressure is well-controlled on 10 mg benazepil. She denies chest pain, SOB, hemoptysis, and leg pain. the swelling seems to be improving in her leg, but can still be tender to the touch. The neurosurgeon discussed her prognosis and deferred surgery for now. He states the anticoagulation is the more important issue and he does not want to interrupt AC therapy within a year. She does not have a loss in strength at this time. He will recheck her in three months. Overall, she is doing well despite grieving her husband's death. No complaints today.   The patient does not have symptoms concerning for COVID-19 infection (fever, chills, cough, or new shortness of breath).    Past Medical History:  Diagnosis Date  . Allergic rhinitis   . Decreased GFR   . Endometriosis   . History of skin cancer    squamous & basal cell ;Dr Denna Haggard  . Hyperlipidemia   . Hypertension   . Osteoarthritis   . Psoriasis   . Raynaud disease   . Rheumatoid arthritis(714.0)    Dr. Estanislado Pandy  . UTI (lower urinary tract infection) 5/15   Proteus , R to Nitrofurantoin & Penicillin   Past Surgical History:  Procedure Laterality Date  . APPENDECTOMY  age 64  . CATARACT EXTRACTION Bilateral   . COLONOSCOPY     Neg 2; Gloria Warren , [place]  . G 2  P 2    . TONSILLECTOMY    . TOTAL ABDOMINAL HYSTERECTOMY     age 41 for endometriosis (no BSO)     Current Meds  Medication Sig  . albuterol (PROVENTIL) (2.5 MG/3ML) 0.083% nebulizer solution Take 3 mLs (2.5 mg total) by nebulization every 6 (six) hours as needed for wheezing.  Marland Kitchen apixaban (ELIQUIS) 5 MG TABS tablet Take 1 tablet (5 mg total) by mouth 2 (two) times daily.  . benazepril (LOTENSIN) 10 MG tablet  TAKE 1 TABLET(10 MG) BY MOUTH DAILY  . Calcium Carbonate (CALTRATE 600 PO) Take 600 mg by mouth 2 (two) times daily.    . cholecalciferol (VITAMIN D) 1000 units tablet Take 1,000 Units by mouth daily.  Marland Kitchen Fexofenadine HCl (ALLEGRA PO) Take 180 mg by mouth at bedtime.   . furosemide (LASIX) 40 MG tablet Take 1 tablet (40 mg total) by mouth daily. (Patient taking differently: Take 20 mg by mouth daily. )  . HYDROcodone-acetaminophen (NORCO/VICODIN) 5-325 MG tablet TAKE ONE TABLET BY MOUTH EVERY 6 HOURS PRN  . hydroxychloroquine (PLAQUENIL) 200 MG tablet Take 200 mg by mouth 2 (two) times daily.   . Multiple Vitamins-Minerals (VISION-VITE PRESERVE PO) Take by mouth.  . predniSONE (DELTASONE) 20 MG tablet Take 10 mg by mouth daily with breakfast.      Allergies:   Arava [leflunomide], Sulfonamide derivatives, and Remicade [infliximab]   Social History   Tobacco Use  . Smoking status: Never Smoker  . Smokeless tobacco: Never Used  Substance Use Topics  . Alcohol use: Yes    Alcohol/week: 0.0 standard drinks    Comment:  rarely  . Drug use: No     Family Hx: The patient's family history includes Breast cancer (age of onset: 53) in her mother; Diabetes in her maternal aunt; Gout in her mother; Heart attack (age of onset: 65) in her father; Hypertension in her father, mother, and son; Stroke in her maternal grandmother and sister.  ROS:   Please see the history of present illness.     All other systems reviewed and are negative.   Prior CV studies:   The following studies were reviewed today:  Lower extremity US 09/24/19: Summary: Right: Findings consistent with acute deep vein thrombosis involving the right femoral vein, and right proximal profunda vein. Findings consistent with age indeterminate deep vein thrombosis involving the right common femoral vein. No cystic structure  found in the popliteal fossa. Extensive acute thrombus is noted in the proximal-distal external iliac vein.  IVC not visualized due to overlying bowel gas.  Left: No evidence of common femoral vein obstruction. Findings consistent with acute superficial vein thrombosis involving the left great saphenous vein.  Labs/Other Tests and Data Reviewed:    EKG:  No ECG reviewed.  Recent Labs: 06/29/2019: ALT 24; BUN 61; Creatinine, Ser 1.68; Hemoglobin 10.3; Platelets 323.0; Potassium 4.7; Sodium 138   Recent Lipid Panel Lab Results  Component Value Date/Time   CHOL 214 (H) 06/21/2015 09:53 AM   CHOL 140 03/24/2014 11:07 AM   TRIG 137 06/21/2015 09:53 AM   TRIG 113 03/24/2014 11:07 AM   HDL 57 06/21/2015 09:53 AM   HDL 68 03/24/2014 11:07 AM   CHOLHDL 3.8 06/21/2015 09:53 AM   LDLCALC 130 (H) 06/21/2015 09:53 AM   LDLCALC 49 03/24/2014 11:07 AM   LDLDIRECT 77.2 02/25/2008  08:39 AM    Wt Readings from Last 3 Encounters:  10/12/19 129 lb (58.5 kg)  09/24/19 129 lb (58.5 kg)  09/04/17 144 lb (65.3 kg)     Objective:    Vital Signs:  BP 114/68   Pulse 99   Wt 129 lb (58.5 kg)   BMI 25.19 kg/m    VITAL SIGNS:  reviewed GEN:  no acute distress RESPIRATORY:  respirations unlabored NEURO:  alert and oriented x 3, no obvious focal deficit PSYCH:  normal affect  ASSESSMENT & PLAN:    DVT Chronic anticoagulation She is doing well on the eliquis 5 mg BID without bleeding problems. She thinks the swelling and pain in her right leg is improving, but not completely resolved. They question length of AC therapy and timing of repeat studies. I relayed that she would be on eliquis for at least 1 year and that I would touch base with Dr. Sallyanne Kuster  - would tentatively recommend repeat duplex in 3 months. We again reviewed when to call the office and when to come to the ER.    Hypertension Pressure is well-controlled, no medication changes.   Cervical spine changes on imaging Neurosurgery has deferred any surgical intervention for at least 1 year while she is on [facility]. They will continue to  evaluate.    Recommend follow up with Dr. Sallyanne Kuster in 2 months - can be virtual given age and clinical picture.   COVID-19 Education: The signs and symptoms of COVID-19 were discussed with the patient and how to seek care for testing (follow up with PCP or arrange E-visit).  The importance of social distancing was discussed today.  Time:   Today, I have spent 16 minutes with the patient with telehealth technology discussing the above problems.     Medication Adjustments/Labs and Tests Ordered: Current medicines are reviewed at length with the patient today.  Concerns regarding medicines are outlined above.   Tests Ordered: No orders of the defined types were placed in this encounter.   Medication Changes: No orders of the defined types were placed in this encounter.   Follow Up:  Virtual Visit  in 2 month(s)  Signed, Ledora Bottcher, PA  10/12/2019 8:47 AM    [place]

## 2019-10-12 ENCOUNTER — Telehealth: Payer: Self-pay | Admitting: Cardiovascular Disease

## 2019-10-12 ENCOUNTER — Encounter: Payer: Self-pay | Admitting: Internal Medicine

## 2019-10-12 ENCOUNTER — Telehealth (INDEPENDENT_AMBULATORY_CARE_PROVIDER_SITE_OTHER): Payer: Medicare Other | Admitting: Physician Assistant

## 2019-10-12 ENCOUNTER — Encounter: Payer: Self-pay | Admitting: Physician Assistant

## 2019-10-12 VITALS — BP 114/68 | HR 99 | Wt 129.0 lb

## 2019-10-12 DIAGNOSIS — Z7901 Long term (current) use of anticoagulants: Secondary | ICD-10-CM

## 2019-10-12 DIAGNOSIS — I82409 Acute embolism and thrombosis of unspecified deep veins of unspecified lower extremity: Secondary | ICD-10-CM | POA: Insufficient documentation

## 2019-10-12 DIAGNOSIS — I10 Essential (primary) hypertension: Secondary | ICD-10-CM | POA: Diagnosis not present

## 2019-10-12 DIAGNOSIS — I82411 Acute embolism and thrombosis of right femoral vein: Secondary | ICD-10-CM

## 2019-10-12 NOTE — Telephone Encounter (Signed)
-----   Message from Therisa Doyne sent at 10/12/2019  8:53 AM EST ----- Regarding: F/u appt Please call pt per Doreene Adas, PA to schedule a 2 month f/u appt with Dr. Burgess Amor be virtual.  Thanks, Lovena Le, [place]

## 2019-10-12 NOTE — Telephone Encounter (Signed)
° ° °  Scheduler left message vcml to schedule

## 2019-10-12 NOTE — Patient Instructions (Signed)
Medication Instructions:  Your physician recommends that you continue on your current medications as directed. Please refer to the Current Medication list given to you today.  *If you need a refill on your cardiac medications before your next appointment, please call your pharmacy*   Follow-Up: At [facility], you and your health needs are our priority.  As part of our continuing mission to provide you with exceptional heart care, we have created designated Provider Care Teams.  These Care Teams include your primary Cardiologist (physician) and Advanced Practice Providers (APPs -  Physician Assistants and Nurse Practitioners) who all work together to provide you with the care you need, when you need it.  Your next appointment:   2 month(s)  The format for your next appointment:   Either In Person or Virtual  Provider:   Sanda Klein, MD

## 2019-10-14 ENCOUNTER — Telehealth: Payer: Self-pay

## 2019-10-14 NOTE — Telephone Encounter (Signed)
Copied from [place] [id number]. Topic: General - Inquiry >> Oct 14, 2019  3:31 PM Mathis Bud wrote: Reason for CRM: Patient son called regarding patients husbands passed away and patient is having really bad anxiety , patient is having issues taking care of herself due to her husband was taking care of her.  Patient also is having serve neck pain.  Patient son is requesting a call back.   Call back [phone]

## 2019-10-14 NOTE — Telephone Encounter (Signed)
Should this be some sort of an appointment?

## 2019-10-14 NOTE — Telephone Encounter (Signed)
We can start a daily antianxiety medication such as Lexapro.  We would start at a low dose and increase if needed.  Has she tried taking her hydrocodone for the neck pain?  If not she may need to take that more regularly for the pain.  What did neurosurgery say?

## 2019-10-16 MED ORDER — ESCITALOPRAM OXALATE 5 MG PO TABS: 5.0000 mg | ORAL_TABLET | Freq: Every day | ORAL | 5 refills | Status: DC

## 2019-10-16 NOTE — Addendum Note (Signed)
Addended by: Binnie Rail on: 10/16/2019 10:50 AM   Modules accepted: Orders

## 2019-10-16 NOTE — Telephone Encounter (Signed)
Neurosurgery said she had fluid in her lungs. Passed strength test. Goes back in 3 months. Wanted her to focus more on blood clots. Has issues with external hemorrhoids. Pt has been weak, tired, and some fever. Son says he has been watching her at night and her breathing has not been normal but pt has not complained at all about any SOB or breathing problems.    Does want to try lexapro.   Does take hydrocodone already BID.    How much tylenol can she take?   I did offer a virtual which son did not seem up for at the moment. I also suggested that if her symptoms get worse he may need to have her evaluated at the ED. Trying to prevent that at the moment.

## 2019-10-16 NOTE — Telephone Encounter (Signed)
lexapro 5 mg sent to [facility] - this is the smallest dose.  If tolerated well we can increase further.    She can take 3000 mg of tylenol daily in total -- the hydrocodone as some tylenol in it so that needs to be taken into account

## 2019-10-16 NOTE — Telephone Encounter (Signed)
Pt's son called to follow up on call/question from 12/2. Requesting CB this morning from [address] with any update. Please advise.

## 2019-10-16 NOTE — Telephone Encounter (Signed)
Son aware.

## 2019-10-20 ENCOUNTER — Emergency Department (HOSPITAL_COMMUNITY)
Admission: EM | Admit: 2019-10-20 | Discharge: 2019-10-20 | Disposition: A | Discharge status: Home / Self Care | Payer: Medicare Other | Source: Home / Self Care | Attending: Emergency Medicine | Admitting: Emergency Medicine

## 2019-10-20 ENCOUNTER — Other Ambulatory Visit: Payer: Self-pay

## 2019-10-20 ENCOUNTER — Ambulatory Visit (INDEPENDENT_AMBULATORY_CARE_PROVIDER_SITE_OTHER): Payer: Medicare Other

## 2019-10-20 ENCOUNTER — Ambulatory Visit (INDEPENDENT_AMBULATORY_CARE_PROVIDER_SITE_OTHER)
Admission: EM | Admit: 2019-10-20 | Discharge: 2019-10-20 | Disposition: A | Discharge status: Home / Self Care | Payer: Medicare Other | Source: Home / Self Care

## 2019-10-20 DIAGNOSIS — U071 COVID-19: Secondary | ICD-10-CM

## 2019-10-20 DIAGNOSIS — R000 Tachycardia, unspecified: Secondary | ICD-10-CM

## 2019-10-20 DIAGNOSIS — R5383 Other fatigue: Secondary | ICD-10-CM

## 2019-10-20 DIAGNOSIS — Z79899 Other long term (current) drug therapy: Secondary | ICD-10-CM | POA: Insufficient documentation

## 2019-10-20 DIAGNOSIS — R05 Cough: Secondary | ICD-10-CM | POA: Diagnosis not present

## 2019-10-20 DIAGNOSIS — R918 Other nonspecific abnormal finding of lung field: Secondary | ICD-10-CM | POA: Insufficient documentation

## 2019-10-20 DIAGNOSIS — I129 Hypertensive chronic kidney disease with stage 1 through stage 4 chronic kidney disease, or unspecified chronic kidney disease: Secondary | ICD-10-CM | POA: Insufficient documentation

## 2019-10-20 DIAGNOSIS — R531 Weakness: Secondary | ICD-10-CM

## 2019-10-20 DIAGNOSIS — D72829 Elevated white blood cell count, unspecified: Secondary | ICD-10-CM | POA: Insufficient documentation

## 2019-10-20 DIAGNOSIS — N183 Chronic kidney disease, stage 3 unspecified: Secondary | ICD-10-CM | POA: Insufficient documentation

## 2019-10-20 DIAGNOSIS — Z7901 Long term (current) use of anticoagulants: Secondary | ICD-10-CM | POA: Insufficient documentation

## 2019-10-20 HISTORY — DX: Acute embolism and thrombosis of unspecified deep veins of unspecified lower extremity: I82.409

## 2019-10-20 LAB — CBC WITH DIFFERENTIAL/PLATELET
Abs Immature Granulocytes: 0.08 10*3/uL — ABNORMAL HIGH (ref 0.00–0.07)
Basophils Absolute: 0 10*3/uL (ref 0.0–0.1)
Basophils Relative: 0 %
Eosinophils Absolute: 0 10*3/uL (ref 0.0–0.5)
Eosinophils Relative: 0 %
HCT: 35.5 % — ABNORMAL LOW (ref 36.0–46.0)
Hemoglobin: 10.8 g/dL — ABNORMAL LOW (ref 12.0–15.0)
Immature Granulocytes: 1 %
Lymphocytes Relative: 3 %
Lymphs Abs: 0.5 10*3/uL — ABNORMAL LOW (ref 0.7–4.0)
MCH: 29.7 pg (ref 26.0–34.0)
MCHC: 30.4 g/dL (ref 30.0–36.0)
MCV: 97.5 fL (ref 80.0–100.0)
Monocytes Absolute: 0.9 10*3/uL (ref 0.1–1.0)
Monocytes Relative: 7 %
Neutro Abs: 12.3 10*3/uL — ABNORMAL HIGH (ref 1.7–7.7)
Neutrophils Relative %: 89 %
Platelets: 399 10*3/uL (ref 150–400)
RBC: 3.64 MIL/uL — ABNORMAL LOW (ref 3.87–5.11)
RDW: 15.8 % — ABNORMAL HIGH (ref 11.5–15.5)
WBC: 13.7 10*3/uL — ABNORMAL HIGH (ref 4.0–10.5)
nRBC: 0 % (ref 0.0–0.2)

## 2019-10-20 LAB — COMPREHENSIVE METABOLIC PANEL
ALT: 22 U/L (ref 0–44)
AST: 23 U/L (ref 15–41)
Albumin: 2.7 g/dL — ABNORMAL LOW (ref 3.5–5.0)
Alkaline Phosphatase: 78 U/L (ref 38–126)
Anion gap: 13 (ref 5–15)
BUN: 42 mg/dL — ABNORMAL HIGH (ref 8–23)
CO2: 26 mmol/L (ref 22–32)
Calcium: 9.5 mg/dL (ref 8.9–10.3)
Chloride: 98 mmol/L (ref 98–111)
Creatinine, Ser: 1.38 mg/dL — ABNORMAL HIGH (ref 0.44–1.00)
GFR calc Af Amer: 40 mL/min — ABNORMAL LOW (ref >60)
GFR calc non Af Amer: 35 mL/min — ABNORMAL LOW (ref >60)
Glucose, Bld: 115 mg/dL — ABNORMAL HIGH (ref 70–99)
Potassium: 4.1 mmol/L (ref 3.5–5.1)
Sodium: 137 mmol/L (ref 135–145)
Total Bilirubin: 0.2 mg/dL — ABNORMAL LOW (ref 0.3–1.2)
Total Protein: 6.3 g/dL — ABNORMAL LOW (ref 6.5–8.1)

## 2019-10-20 LAB — PROCALCITONIN: Procalcitonin: 0.23 ng/mL

## 2019-10-20 LAB — LACTIC ACID, PLASMA: Lactic Acid, Venous: 1.2 mmol/L (ref 0.5–1.9)

## 2019-10-20 LAB — POC SARS CORONAVIRUS 2 AG -  ED: SARS Coronavirus 2 Ag: POSITIVE — AB

## 2019-10-20 MED ORDER — DOXYCYCLINE HYCLATE 100 MG PO CAPS: 100.0000 mg | ORAL_CAPSULE | Freq: Two times a day (BID) | ORAL | 0 refills | Status: DC

## 2019-10-20 NOTE — Discharge Instructions (Addendum)
Please return to ER if you have any difficulty in breathing, chest pain, other new concerning symptom.  Recommend Tylenol as needed for pain or fevers.  Please call your primary care doctor to schedule virtual appointment for recheck in 48 hours.  Please take antibiotic as prescribed.

## 2019-10-20 NOTE — ED Provider Notes (Signed)
[facility] EMERGENCY DEPARTMENT Provider Note   CSN: [id number] Arrival date & time: 10/20/19  03-04-31     History   Chief Complaint Chief Complaint  Patient presents with  . Covid/weakness  . covid +    HPI Gloria Warren is a 85 y.o. female.  Presents here with generalized weakness.  Since Thursday, patient has had generalized weakness, decreased overall energy.  No focal weakness, no numbness, vision changes.  She noted mild cough over the past 2 days, nonproductive, nonbloody.  No fevers.  Went to urgent care today and diagnosed with Covid, noted to be very tachycardic, transferred for further eval.  Additional history obtained from son, confirms patient's history, states he watches her at night and has caregivers in the home during the day.  He has home oxygen monitoring that he has been using and her oxygen levels have been normal.     HPI  Past Medical History:  Diagnosis Date  . Allergic rhinitis   . Decreased GFR   . DVT (deep venous thrombosis) ([place])   . Endometriosis   . History of skin cancer    squamous & basal cell ;Dr Denna Haggard  . Hyperlipidemia   . Hypertension   . Osteoarthritis   . Psoriasis   . Raynaud disease   . Rheumatoid arthritis(714.0)    Dr. Estanislado Pandy  . UTI (lower urinary tract infection) 5/15   Proteus , R to Nitrofurantoin & Penicillin    Patient Active Problem List   Diagnosis Date Noted  . DVT (deep venous thrombosis) ([place]) 10/12/2019  . Pain and swelling of right lower leg 09/24/2019  . Nausea 06/29/2019  . Vitamin D deficiency 06/29/2019  . Osteoarthritis of both feet 09/25/2016  . Raynaud disease 09/25/2016  . Primary osteoarthritis of both knees 09/24/2016  . Primary osteoarthritis of both hands 09/24/2016  . Chronic kidney disease (CKD) stage G3b 09/24/2016  . Leg edema 11/28/2015  . Psoriasis 03/11/2013  . SKIN CANCER, HX OF 06/26/2010  . Allergic rhinitis 06/23/2009  . Prediabetes 06/23/2009  .  Rheumatoid arthritis ([place]) 02/25/2008  . Hyperlipidemia 11/17/2007  . Essential hypertension 11/17/2007    Past Surgical History:  Procedure Laterality Date  . APPENDECTOMY     age 76  . CATARACT EXTRACTION Bilateral   . COLONOSCOPY     Neg 2; [place] , [place]  . G 2  P 2    . TONSILLECTOMY    . TOTAL ABDOMINAL HYSTERECTOMY     age 60 for endometriosis (no BSO)     OB History    Gravida  2   Para  2   Term  2   Preterm      AB      Living  1     SAB      TAB      Ectopic      Multiple      Live Births           Obstetric Comments  Daughter passed away in Mar 03, 2005 to AVM.         Home Medications    Prior to Admission medications   Medication Sig Start Date End Date Taking? Authorizing Provider  albuterol (PROVENTIL) (2.5 MG/3ML) 0.083% nebulizer solution Take 3 mLs (2.5 mg total) by nebulization every 6 (six) hours as needed for wheezing. 08/21/19   Cirigliano, Garvin Fila, DO  apixaban (ELIQUIS) 5 MG TABS tablet Take 1 tablet (5 mg total) by mouth 2 (two) times  daily. 09/24/19   Croitoru, Mihai, MD  benazepril (LOTENSIN) 10 MG tablet TAKE 1 TABLET(10 MG) BY MOUTH DAILY 07/13/19   Burns, Claudina Lick, MD  Calcium Carbonate (CALTRATE 600 PO) Take 600 mg by mouth 2 (two) times daily.      [provider]  cholecalciferol (VITAMIN D) 1000 units tablet Take 1,000 Units by mouth daily.    [provider]  citalopram (CELEXA) 10 MG tablet Take 5 mg by mouth daily.    [provider]  doxycycline (VIBRAMYCIN) 100 MG capsule Take 1 capsule (100 mg total) by mouth 2 (two) times daily for 7 days. 10/20/19 10/27/19  Lucrezia Starch, MD  escitalopram (LEXAPRO) 5 MG tablet Take 1 tablet (5 mg total) by mouth daily. 10/16/19   Binnie Rail, MD  Fexofenadine HCl (ALLEGRA PO) Take 180 mg by mouth at bedtime.     [provider]  furosemide (LASIX) 40 MG tablet Take 1 tablet (40 mg total) by mouth daily. Patient taking differently: Take 20 mg by  mouth daily.  06/29/19   Binnie Rail, MD  HYDROcodone-acetaminophen (NORCO/VICODIN) 5-325 MG tablet TAKE ONE TABLET BY MOUTH EVERY 6 HOURS PRN 08/06/19   [provider]  hydroxychloroquine (PLAQUENIL) 200 MG tablet Take 200 mg by mouth 2 (two) times daily.  05/07/16   [provider]  Multiple Vitamins-Minerals (VISION-VITE PRESERVE PO) Take by mouth.    [provider]  predniSONE (DELTASONE) 20 MG tablet Take 10 mg by mouth daily with breakfast.     [provider]    Family History Family History  Problem Relation Age of Onset  . Breast cancer Mother 1  . Hypertension Mother   . Gout Mother   . Hypertension Father   . Heart attack Father 40  . Stroke Sister        doing well  . Diabetes Maternal Aunt   . Hypertension Son   . Stroke Maternal Grandmother        late 46s    Social History Social History   Tobacco Use  . Smoking status: Never Smoker  . Smokeless tobacco: Never Used  Substance Use Topics  . Alcohol use: Not Currently    Alcohol/week: 0.0 standard drinks    Comment:  rarely  . Drug use: No     Allergies   Arava [leflunomide], Sulfonamide derivatives, and Remicade [infliximab]   Review of Systems Review of Systems  Constitutional: Positive for chills and fatigue. Negative for fever.  HENT: Negative for ear pain and sore throat.   Eyes: Negative for pain and visual disturbance.  Respiratory: Negative for cough and shortness of breath.   Cardiovascular: Negative for chest pain and palpitations.  Gastrointestinal: Negative for abdominal pain and vomiting.  Genitourinary: Negative for dysuria and hematuria.  Musculoskeletal: Negative for arthralgias and back pain.  Skin: Negative for color change and rash.  Neurological: Negative for seizures and syncope.  All other systems reviewed and are negative.    Physical Exam Updated Vital Signs BP 105/71   Pulse 96   Temp 98.5 F (36.9 C)   Resp 14   Ht 5' (1.524  m)   Wt 58.5 kg   SpO2 97%   BMI 25.19 kg/m   Physical Exam Vitals signs and nursing note reviewed.  Constitutional:      General: She is not in acute distress.    Appearance: She is well-developed.  HENT:     Head: Normocephalic and atraumatic.  Eyes:  Conjunctiva/sclera: Conjunctivae normal.  Neck:     Musculoskeletal: Neck supple.  Cardiovascular:     Rate and Rhythm: Normal rate and regular rhythm.     Heart sounds: No murmur.  Pulmonary:     Effort: Pulmonary effort is normal. No respiratory distress.     Breath sounds: Normal breath sounds.  Abdominal:     Palpations: Abdomen is soft.     Tenderness: There is no abdominal tenderness.  Musculoskeletal:        General: No swelling or tenderness.  Skin:    General: Skin is warm and dry.     Capillary Refill: Capillary refill takes less than 2 seconds.  Neurological:     General: No focal deficit present.     Mental Status: She is alert and oriented to person, place, and time.  Psychiatric:        Mood and Affect: Mood normal.        Behavior: Behavior normal.      ED Treatments / Results  Labs (all labs ordered are listed, but only abnormal results are displayed) Labs Reviewed  CBC WITH DIFFERENTIAL/PLATELET - Abnormal; Notable for the following components:      Result Value   WBC 13.7 (*)    RBC 3.64 (*)    Hemoglobin 10.8 (*)    HCT 35.5 (*)    RDW 15.8 (*)    Neutro Abs 12.3 (*)    Lymphs Abs 0.5 (*)    Abs Immature Granulocytes 0.08 (*)    All other components within normal limits  COMPREHENSIVE METABOLIC PANEL - Abnormal; Notable for the following components:   Glucose, Bld 115 (*)    BUN 42 (*)    Creatinine, Ser 1.38 (*)    Total Protein 6.3 (*)    Albumin 2.7 (*)    Total Bilirubin 0.2 (*)    GFR calc non Af Amer 35 (*)    GFR calc Af Amer 40 (*)    All other components within normal limits  CULTURE, BLOOD (ROUTINE X 2)  CULTURE, BLOOD (ROUTINE X 2)  LACTIC ACID, PLASMA  PROCALCITONIN     EKG None  EKG performed at 12:40 PM on October 20, 2019 shows sinus tachycardia, rate 138, normal intervals, no ST segment changes or T wave inversions, no acute STEMI    Radiology Dg Chest 2 View  Result Date: 10/20/2019 CLINICAL DATA:  Weakness/fatigue, dry cough x 5 days. Pt Tachy w/bilateral lower lobe crackles in triage. Hx of HTN. No known lung conditions. Nonsmoker Imaging done in wheelchair, pt unable to stand. Chest done AP. Best images obtainable. EXAM: CHEST - 2 VIEW COMPARISON:  12/27/2008 FINDINGS: Shallow lung inflation. Heart size is accentuated by the shallow lung volumes. There is new elevation of the RIGHT hemidiaphragm. Patchy opacity at the LATERAL LEFT lung base is consistent with early infiltrate. IMPRESSION: 1. Shallow lung inflation. 2. New elevation of the RIGHT hemidiaphragm. 3. Suspect early LEFT lower lobe infiltrate. 4. Followup PA and lateral chest X-ray is recommended in 3-4 weeks following trial of antibiotic therapy to ensure resolution of infiltrates and to evaluate elevated RIGHT hemidiaphragm. Electronically Signed   By: Nolon Nations M.D.   On: 10/20/2019 10:57    Procedures Procedures (including critical care time)  Medications Ordered in ED Medications - No data to display   Initial Impression / Assessment and Plan / ED Course  I have reviewed the triage vital signs and the nursing notes.  Pertinent labs & imaging  results that were available during my care of the patient were reviewed by me and considered in my medical decision making (see chart for details).        85 year old lady with past medical history of DVT on Eliquis presents to ER with generalized weakness in setting of recently diagnosed COVID-19.  On exam today, patient is actually remarkably well-appearing, she has no respiratory complaints except for mild cough, no dyspnea, no tachypnea, no hypoxia on room air.  She was noted to be quite tachycardic at urgent care though after  patient was in room, her heart rate significantly improved without any intervention.  Her chest x-ray is concerning for possible pneumonia, her CBC shows a mild leukocytosis, pro calcitonin was 0.23.  I suspect her CXR findings are most likely related to her COVID-19 and less likely superimposed pneumonia.  However given the white count, will cover with course of oral antibiotics.  At this time, I believe she is appropriate for discharge and outpatient management.  I had extensive conversation both with patient and her son regarding very strict return precautions and need for virtual follow-up with her PCP.     After the discussed management above, the patient was determined to be safe for discharge.  The patient was in agreement with this plan and all questions regarding their care were answered.  ED return precautions were discussed and the patient will return to the ED with any significant worsening of condition.        Final Clinical Impressions(s) / ED Diagnoses   Final diagnoses:  COVID-19  Leukocytosis, unspecified type    ED Discharge Orders         Ordered    doxycycline (VIBRAMYCIN) 100 MG capsule  2 times daily     10/20/19 1442           Lucrezia Starch, MD 10/20/19 1506

## 2019-10-20 NOTE — ED Triage Notes (Signed)
Pt arrives from UC with reports of weakness for about 5 days and a cough. Pt tested positive for Covid test today. Pt had to be picked up and put in wheelchair.

## 2019-10-20 NOTE — ED Provider Notes (Signed)
[facility]    CSN: [id number] Arrival date & time: 10/20/19  [id number]      History   Chief Complaint Chief Complaint  Patient presents with   Cough    HPI Gloria Warren is a 85 y.o. female.   85 year old female with history of rheumatoid arthritis, HLD, HTN, Raynaud's disease, recent DVT on Eliquis, comes in with son for 6-day history of weakness, no energy.  For the past 2 days, has also had cough, throat irritation.  Denies fever, chills.  Patient with history of body aches at baseline, no changes.  Denies abdominal pain, nausea, vomiting, diarrhea.  Denies shortness of breath, chest pain, loss of taste or smell.  Denies one-sided weakness, dizziness, syncope.  Patient at baseline is in wheelchair, but able to ambulate slightly with assistance.  Son states for the past 6 days, she has not be able to do so.  Son denies any mental status changes, lethargy, recent falls.      Past Medical History:  Diagnosis Date   Allergic rhinitis    Decreased GFR    DVT (deep venous thrombosis) (HCC)    Endometriosis    History of skin cancer    squamous & basal cell ;Dr Denna Haggard   Hyperlipidemia    Hypertension    Osteoarthritis    Psoriasis    Raynaud disease    Rheumatoid arthritis(714.0)    Dr. Estanislado Pandy   UTI (lower urinary tract infection) 5/15   Proteus , R to Nitrofurantoin & Penicillin    Patient Active Problem List   Diagnosis Date Noted   DVT (deep venous thrombosis) ([place]) 10/12/2019   Pain and swelling of right lower leg 09/24/2019   Nausea 06/29/2019   Vitamin D deficiency 06/29/2019   Osteoarthritis of both feet 09/25/2016   Raynaud disease 09/25/2016   Primary osteoarthritis of both knees 09/24/2016   Primary osteoarthritis of both hands 09/24/2016   Chronic kidney disease (CKD) stage G3b 09/24/2016   Leg edema 11/28/2015   Psoriasis 03/11/2013   SKIN CANCER, HX OF 06/26/2010   Allergic rhinitis 06/23/2009   Prediabetes  06/23/2009   Rheumatoid arthritis ([place]) 02/25/2008   Hyperlipidemia 11/17/2007   Essential hypertension 11/17/2007    Past Surgical History:  Procedure Laterality Date   APPENDECTOMY     age 84   CATARACT EXTRACTION Bilateral    COLONOSCOPY     Neg 2; [place] , [place]   G 2  P 2     TONSILLECTOMY     TOTAL ABDOMINAL HYSTERECTOMY     age 28 for endometriosis (no BSO)    OB History    Gravida  2   Para  2   Term  2   Preterm      AB      Living  1     SAB      TAB      Ectopic      Multiple      Live Births           Obstetric Comments  Daughter passed away in 2005-02-11 to AVM.         Home Medications    Prior to Admission medications   Medication Sig Start Date End Date Taking? Authorizing Provider  citalopram (CELEXA) 10 MG tablet Take 5 mg by mouth daily.   Yes [provider]  albuterol (PROVENTIL) (2.5 MG/3ML) 0.083% nebulizer solution Take 3 mLs (2.5 mg total) by nebulization every 6 (six) hours as  needed for wheezing. 08/21/19   Cirigliano, Garvin Fila, DO  apixaban (ELIQUIS) 5 MG TABS tablet Take 1 tablet (5 mg total) by mouth 2 (two) times daily. 09/24/19   Croitoru, Mihai, MD  benazepril (LOTENSIN) 10 MG tablet TAKE 1 TABLET(10 MG) BY MOUTH DAILY 07/13/19   Burns, Claudina Lick, MD  Calcium Carbonate (CALTRATE 600 PO) Take 600 mg by mouth 2 (two) times daily.      [provider]  cholecalciferol (VITAMIN D) 1000 units tablet Take 1,000 Units by mouth daily.    [provider]  escitalopram (LEXAPRO) 5 MG tablet Take 1 tablet (5 mg total) by mouth daily. 10/16/19   Binnie Rail, MD  Fexofenadine HCl (ALLEGRA PO) Take 180 mg by mouth at bedtime.     [provider]  furosemide (LASIX) 40 MG tablet Take 1 tablet (40 mg total) by mouth daily. Patient taking differently: Take 20 mg by mouth daily.  06/29/19   Binnie Rail, MD  HYDROcodone-acetaminophen (NORCO/VICODIN) 5-325 MG tablet TAKE ONE TABLET BY MOUTH EVERY 6  HOURS PRN 08/06/19   [provider]  hydroxychloroquine (PLAQUENIL) 200 MG tablet Take 200 mg by mouth 2 (two) times daily.  05/07/16   [provider]  Multiple Vitamins-Minerals (VISION-VITE PRESERVE PO) Take by mouth.    [provider]  predniSONE (DELTASONE) 20 MG tablet Take 10 mg by mouth daily with breakfast.     [provider]    Family History Family History  Problem Relation Age of Onset   Breast cancer Mother 62   Hypertension Mother    Gout Mother    Hypertension Father    Heart attack Father 39   Stroke Sister        doing well   Diabetes Maternal Aunt    Hypertension Son    Stroke Maternal Grandmother        late 18s    Social History Social History   Tobacco Use   Smoking status: Never Smoker   Smokeless tobacco: Never Used  Substance Use Topics   Alcohol use: Not Currently    Alcohol/week: 0.0 standard drinks    Comment:  rarely   Drug use: No     Allergies   Arava [leflunomide], Sulfonamide derivatives, and Remicade [infliximab]   Review of Systems Review of Systems  Reason unable to perform ROS: See HPI as above.     Physical Exam Triage Vital Signs ED Triage Vitals  Enc Vitals Group     BP 10/20/19 0946 106/72     Pulse Rate 10/20/19 0946 (!) 132     Resp 10/20/19 0946 18     Temp 10/20/19 0946 98.8 F (37.1 C)     Temp Source 10/20/19 0946 Oral     SpO2 10/20/19 0946 97 %     Weight --      Height --      Head Circumference --      Peak Flow --      Pain Score 10/20/19 0947 0     Pain Loc --      Pain Edu? --      Excl. in [place]? --    No data found.  Updated Vital Signs BP 106/72 (BP Location: Left Arm)    Pulse (!) 132    Temp 98.8 F (37.1 C) (Oral)    Resp 18    SpO2 97%   Physical Exam Constitutional:      General: She is not  in acute distress.    Appearance: Normal appearance. She is not ill-appearing, toxic-appearing or diaphoretic.     Comments: Sitting in wheelchair.    HENT:     Head: Normocephalic and atraumatic.     Mouth/Throat:     Mouth: Mucous membranes are moist.     Pharynx: Oropharynx is clear. Uvula midline.  Neck:     Musculoskeletal: Normal range of motion and neck supple.  Cardiovascular:     Rate and Rhythm: Regular rhythm. Tachycardia present.     Heart sounds: Normal heart sounds. No murmur. No friction rub. No gallop.   Pulmonary:     Effort: Pulmonary effort is normal. No accessory muscle usage, prolonged expiration, respiratory distress or retractions.     Comments: Bilateral crackles to lower bases. Good air movement. Speaking in full sentences.  Neurological:     General: No focal deficit present.     Mental Status: She is alert and oriented to person, place, and time.      UC Treatments / Results  Labs (all labs ordered are listed, but only abnormal results are displayed) Labs Reviewed  POC SARS CORONAVIRUS 2 AG -  ED - Abnormal; Notable for the following components:      Result Value   SARS Coronavirus 2 Ag Positive (*)    All other components within normal limits    EKG   Radiology Dg Chest 2 View  Result Date: 10/20/2019 CLINICAL DATA:  Weakness/fatigue, dry cough x 5 days. Pt Tachy w/bilateral lower lobe crackles in triage. Hx of HTN. No known lung conditions. Nonsmoker Imaging done in wheelchair, pt unable to stand. Chest done AP. Best images obtainable. EXAM: CHEST - 2 VIEW COMPARISON:  12/27/2008 FINDINGS: Shallow lung inflation. Heart size is accentuated by the shallow lung volumes. There is new elevation of the RIGHT hemidiaphragm. Patchy opacity at the LATERAL LEFT lung base is consistent with early infiltrate. IMPRESSION: 1. Shallow lung inflation. 2. New elevation of the RIGHT hemidiaphragm. 3. Suspect early LEFT lower lobe infiltrate. 4. Followup PA and lateral chest X-ray is recommended in 3-4 weeks following trial of antibiotic therapy to ensure resolution of infiltrates and to evaluate elevated RIGHT  hemidiaphragm. Electronically Signed   By: Nolon Nations M.D.   On: 10/20/2019 10:57    Procedures Procedures (including critical care time)  Medications Ordered in UC Medications - No data to display  Initial Impression / Assessment and Plan / UC Course  I have reviewed the triage vital signs and the nursing notes.  Pertinent labs & imaging results that were available during my care of the patient were reviewed by me and considered in my medical decision making (see chart for details).    85 year old female with 6 day history of weakness and URI symptoms. Afebrile, without shortness of breath, chest pain. Tachycardic at 132. O2 sat stable at 95%. BP 106/72 for which son states elevated from baseline. She has recent DVT currently on eliquis. CXR with new right hemidiaphragm and left lower lobe infiltrate. Given history and exam, patient transported to ED via EMS for further evaluation and management needed.  Please call Kamron Marklin (Son, caretaker) at [phone]   Final Clinical Impressions(s) / UC Diagnoses   Final diagnoses:  COVID-19  Tachycardia  Weakness  Left lower lobe pulmonary infiltrate   ED Prescriptions    None     PDMP not reviewed this encounter.   Ok Edwards, PA-C 10/20/19 1151

## 2019-10-20 NOTE — ED Triage Notes (Signed)
Per son pt hasn't felt good since last thursday, increased weakness and no energy. Pt c/o cough and scratchy throat since yesterday.

## 2019-10-20 NOTE — Discharge Instructions (Addendum)
85 year old female with history of rheumatoid arthritis, HLD, HTN, Raynaud's disease, recent DVT on Eliquis, comes in with son for 6-day history of weakness, no energy.  For the past 2 days, has also had cough, throat irritation.  Denies fever, chills.  Denies shortness of breath, chest pain, loss of taste or smell.  Denies one-sided weakness, dizziness, syncope.  Patient at baseline is in wheelchair, but able to ambulate slightly with assistance.  Son states for the past 6 days, she has not be able to do so.  Son denies any mental status changes, lethargy, recent falls.   Patient tachycardic at 132, O2 sat 97%,  BP 106/72 (elevated from baseline) EKG sinus tachycardia, 135bpm, numerous artifact, no obvious ST changes.  Lungs with bilateral lower base rhonchi. Good air movement  Rapid COVID: POSITIVE  CXR: 1. Shallow lung inflation. 2. New elevation of the RIGHT hemidiaphragm. 3. Suspect early LEFT lower lobe infiltrate. 4. Followup PA and lateral chest X-ray is recommended in 3-4 weeks following trial of antibiotic therapy to ensure resolution of infiltrates and to evaluate elevated RIGHT hemidiaphragm.

## 2019-10-20 NOTE — ED Provider Notes (Signed)
MSE was initiated and I personally evaluated the patient and placed orders (if any) at  12:43 PM on October 20, 2019.  The patient appears stable so that the remainder of the MSE may be completed by another provider.  She presents today form UC after being diagnosed with covid.  She has had 6 days of symptoms, 2 days of cough and throat irritation.  Body aches at baseline.  Denies CP/Shob.    She has bilateral rhonchi in lower lung fields. She is tachycardic but awake and alert in no obvious distress.   Gloria Warren is aware that her care has been started.  She is aware of the need to stay for complete evaluation and understands the risks of failing to do so.    Labs ordered.    [name], [place] 10/20/19 1245    Tegeler, Gwenyth Allegra, MD 10/20/19 [phone]

## 2019-10-21 ENCOUNTER — Telehealth: Payer: Self-pay

## 2019-10-21 DIAGNOSIS — U071 COVID-19: Secondary | ICD-10-CM | POA: Insufficient documentation

## 2019-10-21 NOTE — Telephone Encounter (Signed)
Copied from [place] [id number]. Topic: General - Inquiry >> Oct 21, 2019  9:01 AM Richardo Priest, NT wrote: Reason for CRM: Patient's son called in stating mother did test positive for COVID-19. Son is looking for recommendations on what to do. Pt was taken to urgent care for this issues and they have stated she does have pneumonia as well. Pt's son would like a call back. Please advise.

## 2019-10-21 NOTE — Telephone Encounter (Signed)
Spoke with son. He is wanting to know if pts pain med can be increased to 3 times daily? She has a lot of body pain since being out of the hospital. She takes it BID now. Please advise.

## 2019-10-21 NOTE — Progress Notes (Deleted)
Virtual Visit via Video Note  I connected with Gloria Warren on 10/21/19 at 11:15 AM EST by a video enabled telemedicine application and verified that I am speaking with the correct person using two identifiers.   I discussed the limitations of evaluation and management by telemedicine and the availability of in person appointments. The patient expressed understanding and agreed to proceed.  Present for the visit:  Myself, Dr Billey Gosling, [name].  The patient is currently at home and I am in the office.    No referring provider.    History of Present Illness:.  This visit is follow up of COVID - positive 12/8.  She  ROS   Social History   Socioeconomic History  . Marital status: Married    Spouse name: Not on file  . Number of children: Not on file  . Years of education: Not on file  . Highest education level: Not on file  Occupational History  . Not on file  Social Needs  . Financial resource strain: Not on file  . Food insecurity    Worry: Not on file    Inability: Not on file  . Transportation needs    Medical: Not on file    Non-medical: Not on file  Tobacco Use  . Smoking status: Never Smoker  . Smokeless tobacco: Never Used  Substance and Sexual Activity  . Alcohol use: Not Currently    Alcohol/week: 0.0 standard drinks    Comment:  rarely  . Drug use: No  . Sexual activity: Yes    Partners: Male    Birth control/protection: Surgical    Comment: TAH  Lifestyle  . Physical activity    Days per week: Not on file    Minutes per session: Not on file  . Stress: Not on file  Relationships  . Social Herbalist on phone: Not on file    Gets together: Not on file    Attends religious service: Not on file    Active member of club or organization: Not on file    Attends meetings of clubs or organizations: Not on file    Relationship status: Not on file  Other Topics Concern  . Not on file  Social History Narrative  . Not on file      Observations/Objective: Appears well in NAD   Assessment and Plan:  See Problem List for Assessment and Plan of chronic medical problems.   Follow Up Instructions:    I discussed the assessment and treatment plan with the patient. The patient was provided an opportunity to ask questions and all were answered. The patient agreed with the plan and demonstrated an understanding of the instructions.   The patient was advised to call back or seek an in-person evaluation if the symptoms worsen or if the condition fails to improve as anticipated.    Binnie Rail, MD

## 2019-10-21 NOTE — Telephone Encounter (Signed)
Son is aware.

## 2019-10-21 NOTE — Telephone Encounter (Signed)
I think that is very reasonable -- Dr Amil Amen prescribes the medication so he should call him, but I think he would be ok with it

## 2019-10-22 ENCOUNTER — Encounter (HOSPITAL_COMMUNITY)
Admission: EM | Disposition: A | Discharge status: Home / Self Care | Payer: Self-pay | Source: Home / Self Care | Attending: Family Medicine

## 2019-10-22 ENCOUNTER — Ambulatory Visit: Payer: Medicare Other | Admitting: Internal Medicine

## 2019-10-22 ENCOUNTER — Inpatient Hospital Stay (HOSPITAL_COMMUNITY): Payer: Medicare Other

## 2019-10-22 ENCOUNTER — Inpatient Hospital Stay (HOSPITAL_COMMUNITY)
Admission: EM | Admit: 2019-10-22 | Discharge: 2019-10-28 | DRG: 378 | Disposition: A | Discharge status: Home / Self Care | Payer: Medicare Other | Attending: Family Medicine | Admitting: Family Medicine

## 2019-10-22 ENCOUNTER — Other Ambulatory Visit: Payer: Self-pay

## 2019-10-22 ENCOUNTER — Inpatient Hospital Stay (HOSPITAL_COMMUNITY): Payer: Medicare Other | Admitting: Certified Registered Nurse Anesthetist

## 2019-10-22 ENCOUNTER — Encounter (HOSPITAL_COMMUNITY): Payer: Self-pay | Admitting: Emergency Medicine

## 2019-10-22 DIAGNOSIS — D649 Anemia, unspecified: Secondary | ICD-10-CM | POA: Diagnosis present

## 2019-10-22 DIAGNOSIS — K625 Hemorrhage of anus and rectum: Secondary | ICD-10-CM

## 2019-10-22 DIAGNOSIS — M069 Rheumatoid arthritis, unspecified: Secondary | ICD-10-CM | POA: Diagnosis present

## 2019-10-22 DIAGNOSIS — K5909 Other constipation: Secondary | ICD-10-CM | POA: Diagnosis present

## 2019-10-22 DIAGNOSIS — I82411 Acute embolism and thrombosis of right femoral vein: Secondary | ICD-10-CM | POA: Diagnosis present

## 2019-10-22 DIAGNOSIS — Z7901 Long term (current) use of anticoagulants: Secondary | ICD-10-CM

## 2019-10-22 DIAGNOSIS — R935 Abnormal findings on diagnostic imaging of other abdominal regions, including retroperitoneum: Secondary | ICD-10-CM | POA: Diagnosis not present

## 2019-10-22 DIAGNOSIS — Z8249 Family history of ischemic heart disease and other diseases of the circulatory system: Secondary | ICD-10-CM

## 2019-10-22 DIAGNOSIS — Z20828 Contact with and (suspected) exposure to other viral communicable diseases: Secondary | ICD-10-CM | POA: Diagnosis present

## 2019-10-22 DIAGNOSIS — T45515A Adverse effect of anticoagulants, initial encounter: Secondary | ICD-10-CM | POA: Diagnosis present

## 2019-10-22 DIAGNOSIS — D6832 Hemorrhagic disorder due to extrinsic circulating anticoagulants: Secondary | ICD-10-CM | POA: Diagnosis present

## 2019-10-22 DIAGNOSIS — E785 Hyperlipidemia, unspecified: Secondary | ICD-10-CM | POA: Diagnosis present

## 2019-10-22 DIAGNOSIS — Z888 Allergy status to other drugs, medicaments and biological substances status: Secondary | ICD-10-CM | POA: Diagnosis not present

## 2019-10-22 DIAGNOSIS — I129 Hypertensive chronic kidney disease with stage 1 through stage 4 chronic kidney disease, or unspecified chronic kidney disease: Secondary | ICD-10-CM | POA: Diagnosis present

## 2019-10-22 DIAGNOSIS — K921 Melena: Secondary | ICD-10-CM | POA: Diagnosis not present

## 2019-10-22 DIAGNOSIS — M4802 Spinal stenosis, cervical region: Secondary | ICD-10-CM | POA: Diagnosis present

## 2019-10-22 DIAGNOSIS — R54 Age-related physical debility: Secondary | ICD-10-CM | POA: Diagnosis present

## 2019-10-22 DIAGNOSIS — Z85828 Personal history of other malignant neoplasm of skin: Secondary | ICD-10-CM | POA: Diagnosis not present

## 2019-10-22 DIAGNOSIS — K922 Gastrointestinal hemorrhage, unspecified: Secondary | ICD-10-CM | POA: Diagnosis present

## 2019-10-22 DIAGNOSIS — J069 Acute upper respiratory infection, unspecified: Secondary | ICD-10-CM

## 2019-10-22 DIAGNOSIS — D84821 Immunodeficiency due to drugs: Secondary | ICD-10-CM | POA: Diagnosis present

## 2019-10-22 DIAGNOSIS — I824Y1 Acute embolism and thrombosis of unspecified deep veins of right proximal lower extremity: Secondary | ICD-10-CM | POA: Diagnosis not present

## 2019-10-22 DIAGNOSIS — I10 Essential (primary) hypertension: Secondary | ICD-10-CM | POA: Diagnosis not present

## 2019-10-22 DIAGNOSIS — I82421 Acute embolism and thrombosis of right iliac vein: Secondary | ICD-10-CM | POA: Diagnosis present

## 2019-10-22 DIAGNOSIS — Z993 Dependence on wheelchair: Secondary | ICD-10-CM

## 2019-10-22 DIAGNOSIS — Z882 Allergy status to sulfonamides status: Secondary | ICD-10-CM

## 2019-10-22 DIAGNOSIS — Z79899 Other long term (current) drug therapy: Secondary | ICD-10-CM

## 2019-10-22 DIAGNOSIS — U071 COVID-19: Secondary | ICD-10-CM

## 2019-10-22 DIAGNOSIS — I82409 Acute embolism and thrombosis of unspecified deep veins of unspecified lower extremity: Secondary | ICD-10-CM | POA: Diagnosis present

## 2019-10-22 DIAGNOSIS — N1832 Chronic kidney disease, stage 3b: Secondary | ICD-10-CM | POA: Diagnosis present

## 2019-10-22 DIAGNOSIS — Z7952 Long term (current) use of systemic steroids: Secondary | ICD-10-CM

## 2019-10-22 DIAGNOSIS — Z90710 Acquired absence of both cervix and uterus: Secondary | ICD-10-CM

## 2019-10-22 DIAGNOSIS — T378X5A Adverse effect of other specified systemic anti-infectives and antiparasitics, initial encounter: Secondary | ICD-10-CM | POA: Diagnosis present

## 2019-10-22 DIAGNOSIS — F329 Major depressive disorder, single episode, unspecified: Secondary | ICD-10-CM | POA: Diagnosis present

## 2019-10-22 DIAGNOSIS — D689 Coagulation defect, unspecified: Secondary | ICD-10-CM

## 2019-10-22 DIAGNOSIS — Z823 Family history of stroke: Secondary | ICD-10-CM | POA: Diagnosis not present

## 2019-10-22 DIAGNOSIS — K633 Ulcer of intestine: Secondary | ICD-10-CM | POA: Diagnosis present

## 2019-10-22 DIAGNOSIS — I7300 Raynaud's syndrome without gangrene: Secondary | ICD-10-CM | POA: Diagnosis present

## 2019-10-22 DIAGNOSIS — R262 Difficulty in walking, not elsewhere classified: Secondary | ICD-10-CM | POA: Diagnosis present

## 2019-10-22 DIAGNOSIS — K5731 Diverticulosis of large intestine without perforation or abscess with bleeding: Principal | ICD-10-CM | POA: Diagnosis present

## 2019-10-22 DIAGNOSIS — F32A Depression, unspecified: Secondary | ICD-10-CM | POA: Diagnosis present

## 2019-10-22 DIAGNOSIS — K631 Perforation of intestine (nontraumatic): Secondary | ICD-10-CM | POA: Diagnosis not present

## 2019-10-22 DIAGNOSIS — Z833 Family history of diabetes mellitus: Secondary | ICD-10-CM

## 2019-10-22 HISTORY — PX: VENA CAVA FILTER PLACEMENT: SHX1085

## 2019-10-22 HISTORY — PX: VENOGRAM: SHX5497

## 2019-10-22 LAB — COMPREHENSIVE METABOLIC PANEL
ALT: 22 U/L (ref 0–44)
AST: 19 U/L (ref 15–41)
Albumin: 2.4 g/dL — ABNORMAL LOW (ref 3.5–5.0)
Alkaline Phosphatase: 65 U/L (ref 38–126)
Anion gap: 8 (ref 5–15)
BUN: 31 mg/dL — ABNORMAL HIGH (ref 8–23)
CO2: 27 mmol/L (ref 22–32)
Calcium: 9.3 mg/dL (ref 8.9–10.3)
Chloride: 105 mmol/L (ref 98–111)
Creatinine, Ser: 1.22 mg/dL — ABNORMAL HIGH (ref 0.44–1.00)
GFR calc Af Amer: 46 mL/min — ABNORMAL LOW (ref >60)
GFR calc non Af Amer: 40 mL/min — ABNORMAL LOW (ref >60)
Glucose, Bld: 99 mg/dL (ref 70–99)
Potassium: 4 mmol/L (ref 3.5–5.1)
Sodium: 140 mmol/L (ref 135–145)
Total Bilirubin: 0.9 mg/dL (ref 0.3–1.2)
Total Protein: 5.9 g/dL — ABNORMAL LOW (ref 6.5–8.1)

## 2019-10-22 LAB — CBC WITH DIFFERENTIAL/PLATELET
Abs Immature Granulocytes: 0.07 10*3/uL (ref 0.00–0.07)
Basophils Absolute: 0.1 10*3/uL (ref 0.0–0.1)
Basophils Relative: 0 %
Eosinophils Absolute: 0 10*3/uL (ref 0.0–0.5)
Eosinophils Relative: 0 %
HCT: 34.2 % — ABNORMAL LOW (ref 36.0–46.0)
Hemoglobin: 10.5 g/dL — ABNORMAL LOW (ref 12.0–15.0)
Immature Granulocytes: 1 %
Lymphocytes Relative: 13 %
Lymphs Abs: 1.7 10*3/uL (ref 0.7–4.0)
MCH: 30 pg (ref 26.0–34.0)
MCHC: 30.7 g/dL (ref 30.0–36.0)
MCV: 97.7 fL (ref 80.0–100.0)
Monocytes Absolute: 1.2 10*3/uL — ABNORMAL HIGH (ref 0.1–1.0)
Monocytes Relative: 9 %
Neutro Abs: 10.5 10*3/uL — ABNORMAL HIGH (ref 1.7–7.7)
Neutrophils Relative %: 77 %
Platelets: 392 10*3/uL (ref 150–400)
RBC: 3.5 MIL/uL — ABNORMAL LOW (ref 3.87–5.11)
RDW: 15.6 % — ABNORMAL HIGH (ref 11.5–15.5)
WBC: 13.6 10*3/uL — ABNORMAL HIGH (ref 4.0–10.5)
nRBC: 0 % (ref 0.0–0.2)

## 2019-10-22 LAB — ABO/RH: ABO/RH(D): O POS

## 2019-10-22 LAB — LACTATE DEHYDROGENASE: LDH: 167 U/L (ref 98–192)

## 2019-10-22 LAB — FIBRINOGEN: Fibrinogen: 631 mg/dL — ABNORMAL HIGH (ref 210–475)

## 2019-10-22 LAB — D-DIMER, QUANTITATIVE: D-Dimer, Quant: 2.26 ug/mL-FEU — ABNORMAL HIGH (ref 0.00–0.50)

## 2019-10-22 LAB — C-REACTIVE PROTEIN: CRP: 15.7 mg/dL — ABNORMAL HIGH (ref <1.0)

## 2019-10-22 LAB — FERRITIN: Ferritin: 113 ng/mL (ref 11–307)

## 2019-10-22 LAB — PROTIME-INR
INR: 1.5 — ABNORMAL HIGH (ref 0.8–1.2)
Prothrombin Time: 18 seconds — ABNORMAL HIGH (ref 11.4–15.2)

## 2019-10-22 LAB — PROCALCITONIN: Procalcitonin: 0.25 ng/mL

## 2019-10-22 SURGERY — INSERTION, FILTER, VENA CAVA
Anesthesia: Monitor Anesthesia Care

## 2019-10-22 MED ORDER — HYDROCORTISONE NA SUCCINATE PF 100 MG IJ SOLR
50.0000 mg | Freq: Four times a day (QID) | INTRAMUSCULAR | Status: DC
  Administered 2019-10-22 – 2019-10-23 (×4): 50 mg via INTRAVENOUS
  Filled 2019-10-22 (×4): qty 2

## 2019-10-22 MED ORDER — DOXYCYCLINE HYCLATE 100 MG PO TABS
100.0000 mg | ORAL_TABLET | Freq: Two times a day (BID) | ORAL | Status: AC
  Administered 2019-10-22 – 2019-10-26 (×10): 100 mg via ORAL
  Filled 2019-10-22 (×10): qty 1

## 2019-10-22 MED ORDER — LACTATED RINGERS IV SOLN
INTRAVENOUS | Status: DC | PRN
  Administered 2019-10-22: 14:00:00 via INTRAVENOUS

## 2019-10-22 MED ORDER — SODIUM CHLORIDE 0.9 % IV SOLN
INTRAVENOUS | Status: DC | PRN
  Administered 2019-10-22: 500 mL

## 2019-10-22 MED ORDER — SODIUM CHLORIDE 0.9 % IV SOLN: 250.0000 mL | INTRAVENOUS | Status: DC | PRN

## 2019-10-22 MED ORDER — FENTANYL CITRATE (PF) 250 MCG/5ML IJ SOLN
INTRAMUSCULAR | Status: AC
  Filled 2019-10-22: qty 5

## 2019-10-22 MED ORDER — ESCITALOPRAM OXALATE 10 MG PO TABS
5.0000 mg | ORAL_TABLET | Freq: Every day | ORAL | Status: DC
  Administered 2019-10-22 – 2019-10-24 (×3): 5 mg via ORAL
  Filled 2019-10-22 (×3): qty 1

## 2019-10-22 MED ORDER — SODIUM CHLORIDE 0.9 % IV BOLUS
1000.0000 mL | Freq: Once | INTRAVENOUS | Status: AC
  Administered 2019-10-22: 1000 mL via INTRAVENOUS

## 2019-10-22 MED ORDER — OXYCODONE HCL 5 MG PO TABS
ORAL_TABLET | ORAL | Status: AC
  Filled 2019-10-22: qty 1

## 2019-10-22 MED ORDER — MIDAZOLAM HCL 2 MG/2ML IJ SOLN
INTRAMUSCULAR | Status: DC | PRN
  Administered 2019-10-22: .25 mg via INTRAVENOUS

## 2019-10-22 MED ORDER — LORATADINE 10 MG PO TABS
10.0000 mg | ORAL_TABLET | Freq: Every day | ORAL | Status: DC
  Administered 2019-10-22 – 2019-10-28 (×7): 10 mg via ORAL
  Filled 2019-10-22 (×7): qty 1

## 2019-10-22 MED ORDER — EMPTY CONTAINERS FLEXIBLE MISC
900.0000 mg | Freq: Once | Status: AC
  Administered 2019-10-22: 900 mg via INTRAVENOUS
  Filled 2019-10-22: qty 90

## 2019-10-22 MED ORDER — HYDROXYCHLOROQUINE SULFATE 200 MG PO TABS
200.0000 mg | ORAL_TABLET | Freq: Two times a day (BID) | ORAL | Status: DC
  Administered 2019-10-22 – 2019-10-28 (×13): 200 mg via ORAL
  Filled 2019-10-22 (×14): qty 1

## 2019-10-22 MED ORDER — HYDROCODONE-ACETAMINOPHEN 5-325 MG PO TABS
1.0000 | ORAL_TABLET | Freq: Four times a day (QID) | ORAL | Status: DC | PRN
  Administered 2019-10-22 – 2019-10-28 (×6): 1 via ORAL
  Filled 2019-10-22 (×5): qty 1

## 2019-10-22 MED ORDER — PROPOFOL 500 MG/50ML IV EMUL
INTRAVENOUS | Status: DC | PRN
  Administered 2019-10-22: 35 ug/kg/min via INTRAVENOUS

## 2019-10-22 MED ORDER — PHENYLEPHRINE HCL-NACL 10-0.9 MG/250ML-% IV SOLN
INTRAVENOUS | Status: DC | PRN
  Administered 2019-10-22: 20 ug/min via INTRAVENOUS

## 2019-10-22 MED ORDER — DULOXETINE HCL 30 MG PO CPEP
30.0000 mg | ORAL_CAPSULE | Freq: Every day | ORAL | Status: DC
  Administered 2019-10-23 – 2019-10-28 (×6): 30 mg via ORAL
  Filled 2019-10-22 (×7): qty 1

## 2019-10-22 MED ORDER — FENTANYL CITRATE (PF) 100 MCG/2ML IJ SOLN
INTRAMUSCULAR | Status: DC | PRN
  Administered 2019-10-22: 25 ug via INTRAVENOUS

## 2019-10-22 MED ORDER — HYDROCODONE-ACETAMINOPHEN 5-325 MG PO TABS
ORAL_TABLET | ORAL | Status: AC
  Filled 2019-10-22: qty 1

## 2019-10-22 MED ORDER — LIDOCAINE HCL (PF) 1 % IJ SOLN
INTRAMUSCULAR | Status: AC
  Filled 2019-10-22: qty 30

## 2019-10-22 MED ORDER — IODIXANOL 320 MG/ML IV SOLN
INTRAVENOUS | Status: DC | PRN
  Administered 2019-10-22: 35 mL via INTRAVENOUS

## 2019-10-22 MED ORDER — SODIUM CHLORIDE 0.9% FLUSH
3.0000 mL | Freq: Two times a day (BID) | INTRAVENOUS | Status: DC
  Administered 2019-10-22 – 2019-10-26 (×7): 3 mL via INTRAVENOUS

## 2019-10-22 MED ORDER — MIDAZOLAM HCL 2 MG/2ML IJ SOLN
INTRAMUSCULAR | Status: AC
  Filled 2019-10-22: qty 2

## 2019-10-22 MED ORDER — ACETAMINOPHEN 325 MG PO TABS
650.0000 mg | ORAL_TABLET | Freq: Four times a day (QID) | ORAL | Status: DC | PRN
  Administered 2019-10-24 – 2019-10-28 (×8): 650 mg via ORAL
  Filled 2019-10-22 (×8): qty 2

## 2019-10-22 MED ORDER — ONDANSETRON HCL 4 MG PO TABS
4.0000 mg | ORAL_TABLET | Freq: Four times a day (QID) | ORAL | Status: DC | PRN
  Administered 2019-10-26: 4 mg via ORAL
  Filled 2019-10-22: qty 1

## 2019-10-22 MED ORDER — PROPOFOL 10 MG/ML IV BOLUS
INTRAVENOUS | Status: DC | PRN
  Administered 2019-10-22: 10 ug via INTRAVENOUS

## 2019-10-22 MED ORDER — ONDANSETRON HCL 4 MG/2ML IJ SOLN
4.0000 mg | Freq: Four times a day (QID) | INTRAMUSCULAR | Status: DC | PRN
  Administered 2019-10-22 – 2019-10-27 (×5): 4 mg via INTRAVENOUS
  Filled 2019-10-22 (×4): qty 2

## 2019-10-22 MED ORDER — SODIUM CHLORIDE 0.9% FLUSH: 3.0000 mL | INTRAVENOUS | Status: DC | PRN

## 2019-10-22 MED ORDER — ALBUMIN HUMAN 5 % IV SOLN
INTRAVENOUS | Status: DC | PRN
  Administered 2019-10-22: 14:00:00 via INTRAVENOUS

## 2019-10-22 MED ORDER — LIDOCAINE HCL (PF) 1 % IJ SOLN
INTRAMUSCULAR | Status: DC | PRN
  Administered 2019-10-22: 7 mL

## 2019-10-22 SURGICAL SUPPLY — 40 items
ADH SKN CLS APL DERMABOND .7 (GAUZE/BANDAGES/DRESSINGS) ×1
BAG DECANTER FOR FLEXI CONT (MISCELLANEOUS) ×1 IMPLANT
BLADE SURG 11 STRL SS (BLADE) ×1 IMPLANT
COVER BACK TABLE 60X90IN (DRAPES) ×1 IMPLANT
COVER DOME SNAP 22 D (MISCELLANEOUS) ×2 IMPLANT
COVER PROBE W GEL 5X96 (DRAPES) ×5 IMPLANT
COVER WAND RF STERILE (DRAPES) ×1 IMPLANT
DECANTER SPIKE VIAL GLASS SM (MISCELLANEOUS) ×1 IMPLANT
DERMABOND ADVANCED (GAUZE/BANDAGES/DRESSINGS) ×2
DERMABOND ADVANCED .7 DNX12 (GAUZE/BANDAGES/DRESSINGS) ×1 IMPLANT
DRAPE C-ARM 42X72 X-RAY (DRAPES) ×1 IMPLANT
DRAPE LAPAROTOMY T 102X78X121 (DRAPES) IMPLANT
FILTER VC CELECT-FEMORAL (Filter) ×2 IMPLANT
GAUZE 4X4 16PLY RFD (DISPOSABLE) ×1 IMPLANT
GAUZE SPONGE 4X4 12PLY STRL (GAUZE/BANDAGES/DRESSINGS) ×1 IMPLANT
GLOVE BIO SURGEON STRL SZ7.5 (GLOVE) ×3 IMPLANT
GLOVE BIOGEL PI IND STRL 8 (GLOVE) ×1 IMPLANT
GLOVE BIOGEL PI INDICATOR 8 (GLOVE) ×2
GOWN STRL REUS W/ TWL LRG LVL3 (GOWN DISPOSABLE) ×3 IMPLANT
GOWN STRL REUS W/ TWL XL LVL3 (GOWN DISPOSABLE) ×1 IMPLANT
GOWN STRL REUS W/TWL LRG LVL3 (GOWN DISPOSABLE)
GOWN STRL REUS W/TWL XL LVL3 (GOWN DISPOSABLE) ×6
KIT BASIN OR (CUSTOM PROCEDURE TRAY) ×3 IMPLANT
KIT TURNOVER KIT B (KITS) ×3 IMPLANT
NDL HYPO 25GX1X1/2 BEV (NEEDLE) ×1 IMPLANT
NEEDLE HYPO 25GX1X1/2 BEV (NEEDLE) IMPLANT
NEEDLE PERC 18GX7CM (NEEDLE) ×3 IMPLANT
NS IRRIG 1000ML POUR BTL (IV SOLUTION) ×1 IMPLANT
PACK CV ACCESS (CUSTOM PROCEDURE TRAY) ×2 IMPLANT
PACK SURGICAL SETUP 50X90 (CUSTOM PROCEDURE TRAY) ×3 IMPLANT
PAD ARMBOARD 7.5X6 YLW CONV (MISCELLANEOUS) ×6 IMPLANT
SUT MNCRL AB 4-0 PS2 18 (SUTURE) ×3 IMPLANT
SYR 20ML LL LF (SYRINGE) ×5 IMPLANT
SYR 30ML LL (SYRINGE) ×1 IMPLANT
SYR 5ML LL (SYRINGE) ×1 IMPLANT
SYR CONTROL 10ML LL (SYRINGE) ×1 IMPLANT
TOWEL GREEN STERILE (TOWEL DISPOSABLE) ×3 IMPLANT
WATER STERILE IRR 1000ML POUR (IV SOLUTION) ×3 IMPLANT
WIRE BENTSON .035X145CM (WIRE) ×2 IMPLANT
WIRE J 3MM .035X145CM (WIRE) ×1 IMPLANT

## 2019-10-22 NOTE — Anesthesia Procedure Notes (Signed)
Procedure Name: MAC Date/Time: 10/22/2019 1:59 PM Performed by: Janace Litten, CRNA Pre-anesthesia Checklist: Patient identified, Emergency Drugs available, Suction available and Patient being monitored Patient Re-evaluated:Patient Re-evaluated prior to induction Oxygen Delivery Method: Simple face mask

## 2019-10-22 NOTE — ED Triage Notes (Signed)
Pt reports hx of hemmorrhoids, states she had some brb in her underwear this am when she had diarrhea, pt is covid positive and reports a few episodes of diarrhea. Pt has no other complaints. Denies abd pain at this time.

## 2019-10-22 NOTE — Transfer of Care (Signed)
Immediate Anesthesia Transfer of Care Note  Patient: Gloria Warren  Procedure(s) Performed: INSERTION VENA-CAVA FILTER (N/A ) Venogram (N/A )  Patient Location: PACU  Anesthesia Type:General  Level of Consciousness: awake, alert  and patient cooperative  Airway & Oxygen Therapy: Patient Spontanous Breathing  Post-op Assessment: Report given to RN and Post -op Vital signs reviewed and stable  Post vital signs: Reviewed and stable  Last Vitals:  Vitals Value Taken Time  BP    Temp    Pulse    Resp    SpO2      Last Pain:  Vitals:   10/22/19 1132  TempSrc:   PainSc: 9          Complications: No apparent anesthesia complications

## 2019-10-22 NOTE — Op Note (Addendum)
OPERATIVE NOTE   PROCEDURE: 1. left femoral vein cannulation under US guidance 2. Inferior vena cavagram 3. Placement of inferior vena cava filter (Celect)  PRE-OPERATIVE DIAGNOSIS: Extensive right leg DVT in setting of acute GI bleed while on anticoagulation  POST-OPERATIVE DIAGNOSIS: same as above   SURGEON: Marty Heck, MD  ANESTHESIA: moderate sedation and local  ESTIMATED BLOOD LOSS: minimal  FINDING(S): 1. Left common femoral vein percutaneous access with deployment of Celect IVC filter in the inferior vena cava after identification of the renal veins.  After deployment when I exchanged the delivery system for the inner dilator to shoot a final venacavogram I had some resistance advancing the catheter in the left iliac vein after it slipped down.  I subsequently shot a cavogram here and there was some active extravasation from the left iliac vein.  I then pulled the catheter back even further and shot another venogram and this appeared to have resolved.  Patient was stable from a blood pressure standpoint.  Would expect this to tamponade.  SPECIMEN(S):  None  INDICATIONS:   Gloria Warren is a 85 y.o. female who presents with acute GI bleed in the setting of extensive right lower extremity DVT while on anticoagulation.  Vascular surgery was consulted for IVC filter placement.  Inferior vena cava filter placement was indicated.  I discussed with the patient risk, benefits, and alternatives to inferior vena caval filter placement.  The risks include but are not limited to: bleeding, infection, possible injury of the inferior vena cava, injury of structures adjacent to the filter including bowel, migration, fracture, continued pulmonary embolization, and caval thrombosis.  The patient acknowledges the risks and agrees to proceed.  DESCRIPTION: After obtaining full informed written consent, the patient was brought back to the angiography suite and placed supine upon the  angiogram table.  After obtaining adequate anesthesia, the patient was prepped and draped in the standard fashion for: inferior vena cava filter placement via a left femoral approach.  The left femoral vein vein was evaluated with ultrasound, it was patent, an image was saved.  It was cannulated with an 18-gauge needle after giving local anesthesia to the cannulation site.  A Benson wire was passed into the inferior vena cava.  The skin and subcutaneous tract was dilated with a 10-Fr dilator.  The [facility] IVC placement sheath and docked dilator was advanced as a unit over the wire into the inferior vena cava, landing the marked dilator at the level of L2.  The dilator was connected to hand injection contrast and an inferior vena cavagram was completed to identify the lowest renal vein.  The dilator and sheath were reposition to be in an infrarenal position.  The dilator was removed and the Celect filter was loaded through the sheath.  The position of the filter was examined under fluoroscopy before docking the filter delivery device onto the sheath.  The safety button was depressed and then the release button was pushed to detach the filter from the delivery device.  Under fluoroscopy, I verified the filter was detached and slowly removed the delivery device.  Ultimately the filter tilted in the IVC after deployment.  The delivery system was then removed from the sheath.  I then placed the inner dilator back inside the sheath to get a final venacavogram.  System it slipped down and I tried to push it up gently and met some resistance in the iliac vein.  I then immediately stopped.  Venacavogram was then obtained  from where this resided and there was evidence of active extra from the left iliac vein.  I then pulled this down even further and shot another picture that showed resolution.  I would expect any other extravasation here would resolve with tamponade.  The sheath was removed and pressure held to the cannulation  site for 5 minutes.  A sterile bandage was applied to the cannulation site.   The long term plan for this inferior vena cava filter is: liekly permanent placement.    COMPLICATIONS: None  CONDITION: Stable  Marty Heck, MD [facility] Office: [phone] Pager:   10/22/2019, 2:48 PM

## 2019-10-22 NOTE — Consult Note (Signed)
Hospital Consult    Reason for Consult: IVC filter placement Referring Physician: Dr. Lorin Mercy MRN #:  [id number]  History of Present Illness: This is a 85 y.o. female with history of rheumatoid arthritis as well as Raynaud's disease that presents to the ED with acute lower GI bleed in the setting of full anticoagulation for right leg DVT that vascular surgery has been consulted for IVC filter placement.  Patient was initially diagnosed with extensive right leg DVT on 09/24/2019 including nonocclusive thrombus in the right external iliac vein.  She was ultimately anticoagulated on Eliquis.  Last night she had bright red blood per rectum and came into the ED.  This is in the setting of recent Covid diagnosis several days ago.  Patient states she is nonambulatory.  She uses an Clinical research associate.  She has an aide that helps with transfers.  She has been very upset given her husband's recent death.  No history of left leg DVT.  States her leg swelling has been well controlled with compression stockings.  Past Medical History:  Diagnosis Date  . Allergic rhinitis   . Decreased GFR   . DVT (deep venous thrombosis) ([place])   . Endometriosis   . History of skin cancer    squamous & basal cell ;Dr Denna Haggard  . Hyperlipidemia   . Hypertension   . Osteoarthritis   . Psoriasis   . Raynaud disease   . Rheumatoid arthritis(714.0)    Dr. Estanislado Pandy  . UTI (lower urinary tract infection) 5/15   Proteus , R to Nitrofurantoin & Penicillin    Past Surgical History:  Procedure Laterality Date  . APPENDECTOMY     age 54  . CATARACT EXTRACTION Bilateral   . COLONOSCOPY     Neg 2; [place] [facility] [place]  . G 2  P 2    . TONSILLECTOMY    . TOTAL ABDOMINAL HYSTERECTOMY     age 21 for endometriosis (no BSO)    Allergies  Allergen Reactions  . Arava [Leflunomide]     Liver & kidney dysfunction  . Sulfonamide Derivatives     Itching & rash  . Remicade [Infliximab] Hives and Itching    Prior to Admission  medications   Medication Sig Start Date End Date Taking? Authorizing Provider  albuterol (PROVENTIL) (2.5 MG/3ML) 0.083% nebulizer solution Take 3 mLs (2.5 mg total) by nebulization every 6 (six) hours as needed for wheezing. 08/21/19  Yes Cirigliano, Mary K, DO  apixaban (ELIQUIS) 5 MG TABS tablet Take 1 tablet (5 mg total) by mouth 2 (two) times daily. 09/24/19  Yes Croitoru, Mihai, MD  benazepril (LOTENSIN) 10 MG tablet TAKE 1 TABLET(10 MG) BY MOUTH DAILY 07/13/19  Yes Burns, Claudina Lick, MD  Calcium Carbonate (CALTRATE 600 PO) Take 600 mg by mouth 2 (two) times daily.     Yes [provider]  docusate sodium (COLACE) 100 MG capsule Take 100 mg by mouth daily as needed for mild constipation.   Yes [provider]  doxycycline (VIBRAMYCIN) 100 MG capsule Take 1 capsule (100 mg total) by mouth 2 (two) times daily for 7 days. 10/20/19 10/27/19 Yes Dykstra, Ellwood Dense, MD  DULoxetine (CYMBALTA) 30 MG capsule Take 30 mg by mouth daily. 07/13/19  Yes [provider]  escitalopram (LEXAPRO) 5 MG tablet Take 1 tablet (5 mg total) by mouth daily. 10/16/19  Yes Burns, Claudina Lick, MD  Fexofenadine HCl (ALLEGRA PO) Take 180 mg by mouth at bedtime.    Yes [provider]  HYDROcodone-acetaminophen (NORCO/VICODIN) 5-325 MG tablet Take 1 tablet by mouth every 6 (six) hours as needed for moderate pain.  08/06/19  Yes [provider]  hydroxychloroquine (PLAQUENIL) 200 MG tablet Take 200 mg by mouth 2 (two) times daily.  05/07/16  Yes [provider]  Multiple Vitamins-Minerals (VISION-VITE PRESERVE PO) Take by mouth.   Yes [provider]  predniSONE (DELTASONE) 5 MG tablet Take 7.5 mg by mouth daily with breakfast.    Yes [provider]  simethicone (MYLICON) 0000000 MG chewable tablet Chew 125 mg by mouth every 6 (six) hours as needed for flatulence.   Yes [provider]  furosemide (LASIX) 40 MG tablet Take 1 tablet (40 mg total) by mouth daily.  Patient taking differently: Take 20 mg by mouth daily.  06/29/19   Binnie Rail, MD    Social History   Socioeconomic History  . Marital status: Married    Spouse name: Not on file  . Number of children: Not on file  . Years of education: Not on file  . Highest education level: Not on file  Occupational History  . Not on file  Tobacco Use  . Smoking status: Never Smoker  . Smokeless tobacco: Never Used  Substance and Sexual Activity  . Alcohol use: Not Currently    Alcohol/week: 0.0 standard drinks    Comment:  rarely  . Drug use: No  . Sexual activity: Yes    Partners: Male    Birth control/protection: Surgical    Comment: TAH  Other Topics Concern  . Not on file  Social History Narrative  . Not on file   Social Determinants of Health   Financial Resource Strain:   . Difficulty of Paying Living Expenses: Not on file  Food Insecurity:   . Worried About Charity fundraiser in the Last Year: Not on file  . Ran Out of Food in the Last Year: Not on file  Transportation Needs:   . Lack of Transportation (Medical): Not on file  . Lack of Transportation (Non-Medical): Not on file  Physical Activity:   . Days of Exercise per Week: Not on file  . Minutes of Exercise per Session: Not on file  Stress:   . Feeling of Stress : Not on file  Social Connections:   . Frequency of Communication with Friends and Family: Not on file  . Frequency of Social Gatherings with Friends and Family: Not on file  . Attends Religious Services: Not on file  . Active Member of Clubs or Organizations: Not on file  . Attends Archivist Meetings: Not on file  . Marital Status: Not on file  Intimate Partner Violence:   . Fear of Current or Ex-Partner: Not on file  . Emotionally Abused: Not on file  . Physically Abused: Not on file  . Sexually Abused: Not on file     Family History  Problem Relation Age of Onset  . Breast cancer Mother 32  . Hypertension Mother   . Gout Mother    . Hypertension Father   . Heart attack Father 57  . Stroke Sister        doing well  . Diabetes Maternal Aunt   . Hypertension Son   . Stroke Maternal Grandmother        late 56s    ROS: [x]  Positive   [ ]  Negative   [ ]  All sytems reviewed and are negative  Cardiovascular: []  chest pain/pressure []  palpitations []   SOB lying flat []  DOE []  pain in legs while walking []  pain in legs at rest []  pain in legs at night []  non-healing ulcers []  hx of DVT []  swelling in legs  Pulmonary: []  productive cough []  asthma/wheezing []  home O2  Neurologic: []  weakness in []  arms []  legs []  numbness in []  arms []  legs []  hx of CVA []  mini stroke [] difficulty speaking or slurred speech []  temporary loss of vision in one eye []  dizziness  Hematologic: []  hx of cancer []  bleeding problems []  problems with blood clotting easily  Endocrine:   []  diabetes []  thyroid disease  GI []  vomiting blood [x]  blood in stool  GU: []  CKD/renal failure []  HD--[]  M/W/F or []  T/T/S []  burning with urination []  blood in urine  Psychiatric: []  anxiety []  depression  Musculoskeletal: []  arthritis []  joint pain  Integumentary: []  rashes []  ulcers  Constitutional: []  fever []  chills   Physical Examination  Vitals:   10/22/19 0930 10/22/19 1000  BP: (!) 101/56 (!) 100/55  Pulse: (!) 115   Resp: 16 14  Temp:    SpO2: 97%    There is no height or weight on file to calculate BMI.  General:  WDWN in NAD HENT: WNL, normocephalic Pulmonary: normal non-labored breathing, without Rales, rhonchi,  wheezing Cardiac: regular, without  Murmurs, rubs or gallops Abdomen: soft, NT/ND, no masses Vascular Exam/Pulses:  Right Left  Radial 2+ (normal) 2+ (normal)  Ulnar    Femoral 2+ (normal) 2+ (normal)  Popliteal    DP    PT     Extremities: Compression stockings bilaterally Musculoskeletal: no muscle wasting or atrophy  Neurologic: A&O X 3   CBC    Component Value  Date/Time   WBC 13.6 (H) 10/22/2019 0600   RBC 3.50 (L) 10/22/2019 0600   HGB 10.5 (L) 10/22/2019 0600   HCT 34.2 (L) 10/22/2019 0600   PLT 392 10/22/2019 0600   MCV 97.7 10/22/2019 0600   MCH 30.0 10/22/2019 0600   MCHC 30.7 10/22/2019 0600   RDW 15.6 (H) 10/22/2019 0600   LYMPHSABS 1.7 10/22/2019 0600   MONOABS 1.2 (H) 10/22/2019 0600   EOSABS 0.0 10/22/2019 0600   BASOSABS 0.1 10/22/2019 0600    BMET    Component Value Date/Time   NA 140 10/22/2019 0600   NA 139 01/22/2017 0000   K 4.0 10/22/2019 0600   CL 105 10/22/2019 0600   CO2 27 10/22/2019 0600   GLUCOSE 99 10/22/2019 0600   BUN 31 (H) 10/22/2019 0600   BUN 35 (A) 01/22/2017 0000   CREATININE 1.22 (H) 10/22/2019 0600   CALCIUM 9.3 10/22/2019 0600   GFRNONAA 40 (L) 10/22/2019 0600   GFRAA 46 (L) 10/22/2019 0600    COAGS: Lab Results  Component Value Date   INR 1.5 (H) 10/22/2019     Non-Invasive Vascular Imaging:    Reviewed previous venous duplex from November and extensive right leg DVT including the distal right external iliac vein.  The left common femoral and left iliac vein appeared patent.  IVC was not evaluated.   ASSESSMENT/PLAN: This is a 85 y.o. female with multiple medical problems as listed above that presents with acute lower GI bleed and Covid positive diagnosis in the setting of recent extensive right leg DVT.  Vascular surgery has been asked to place IVC filter.  Discussed with patient in detail and this seems appropriate given failure of anticoagulation.  Plan is to place IVC filter this afternoon in the  OR.  Please keep the patient NPO.  Consent order placed in the chart.  Risks and benefits discussed with patient including risk of fracture, malposition, thrombosis, etc.  Marty Heck, MD [facility] Office: [phone] Pager: [phone]

## 2019-10-22 NOTE — ED Notes (Signed)
Hooked patient to the monitor patient is resting with call bell in reach 

## 2019-10-22 NOTE — Anesthesia Postprocedure Evaluation (Signed)
Anesthesia Post Note  Patient: Gloria Warren  Procedure(s) Performed: INSERTION VENA-CAVA FILTER (N/A ) Venogram (N/A )     Patient location during evaluation: PACU Anesthesia Type: MAC Level of consciousness: awake and alert Pain management: pain level controlled Vital Signs Assessment: post-procedure vital signs reviewed and stable Respiratory status: spontaneous breathing and respiratory function stable Cardiovascular status: stable Postop Assessment: no apparent nausea or vomiting Anesthetic complications: no    Last Vitals:  Vitals:   10/22/19 1515 10/22/19 1530  BP: 110/62 (!) 101/57  Pulse: (!) 118 (!) 118  Resp: 16 12  Temp:    SpO2:  90%    Last Pain:  Vitals:   10/22/19 1530  TempSrc:   PainSc: 0-No pain                 Edison Wollschlager DANIEL

## 2019-10-22 NOTE — Consult Note (Signed)
[facility] Consult: 10:15 AM 10/22/2019  LOS: 0 days    Referring Provider: Dr Lorin Mercy  Primary Care Physician:  Binnie Rail, MD Primary Gastroenterologist:  None.       Reason for Consultation:  Anemia.     HPI: Gloria Warren is a 85 y.o. female.  Hx Rheum arthritis.  Takes Plaquenil and 7.5 mg prednisone daily.  Htn.  Hld.  CKD stage 3b.  Remote colonoscopy in [place] on or before 1994.  No records of this.  Pt recalls diagnosis of diverticulosis, no polyps.  No Epic records of any GI imaging.      At baseline mobility w wheelchair, limited ambulation with assistance.    Extensive Right LE DVT dx 09/24/19.  Pt declined suggested admission as she wanted to spend time with her dying husband, so Dr Sallyanne Kuster initiated Eliquis 11/12.  Dr C also planned reaching out to IR re: IVC filter. Swelling, pain, warmth began ~ 09/19/19.      Spinal compression, stenosis in c 5-6, C 6-7 causing pain and headaches. Neurosurgery office eval 11/18.  Neurosurgery deferred due to need for uninterrupted anti-coagulation and plans re-eval in 3 months.   On 12/8 pt dx with Covid 19. Sxs: cough, scratchy throat, weakness above norm.  Tachy in UC, sent to ED where HR normalized and not hypoxic, looked well. Tested + for Covid 19.   CXR w early LLL infiltrate, elevated right diaphragm, WBCs 13.7.  Discharged home on Doxycycline.      For 2 or 3 days she has had constipation in the setting of decreased p.o. intake.  Generally not eating as well since the death of her husband last month.  Noticed a scant amount of blood after a bowel movement a couple of days ago she attributed to hemorrhoids..  Slight abdominal cramping yesterday but no severe pain.  4 AM today she passed a large volume of hematochezia, "blood everywhere".  No  abd pain, nausea, vomiting.  Generally feels tired and fatigue but no dizziness. Return to ED this morning Hgb 10.5, was 10.3 seven weeks ago, 10.8 two days ago.  MCV 97.   INR 1.5.  BUN/Creat 31/1.2, was 61/1.6 >> 42/1.3 at 7 weeks and 2 d ago.   There was a large amount of blood at the rectum but no visible hemorrhoids on rectal exam today.   FM Hx: mother breast cancer, diabetes, htn. died MI age 2 maternal aunt severe diabetes mother's twin father htn, MI died age 5 daughter c3-5 stenosis s/p fusion 2005/11/05 subarachnoid hemorrhage died 68 son htn  Social Hx:  Husband of 51 years died at [facility] Oct 25, 2019.  Causing pt emotional stress.    Past Medical History:  Diagnosis Date   Allergic rhinitis    Decreased GFR    DVT (deep venous thrombosis) ([facility])    Endometriosis    History of skin cancer    squamous & basal cell ;Dr Denna Haggard   Hyperlipidemia    Hypertension    Osteoarthritis    Psoriasis  Raynaud disease    Rheumatoid arthritis(714.0)    Dr. Estanislado Pandy   UTI (lower urinary tract infection) 5/15   Proteus , R to Nitrofurantoin & Penicillin    Past Surgical History:  Procedure Laterality Date   APPENDECTOMY     age 33   CATARACT EXTRACTION Bilateral    COLONOSCOPY     Neg 2; [place] ,    G 2  P 2     TONSILLECTOMY     TOTAL ABDOMINAL HYSTERECTOMY     age 38 for endometriosis (no BSO)    Prior to Admission medications   Medication Sig Start Date End Date Taking? Authorizing Provider  albuterol (PROVENTIL) (2.5 MG/3ML) 0.083% nebulizer solution Take 3 mLs (2.5 mg total) by nebulization every 6 (six) hours as needed for wheezing. 08/21/19  Yes Cirigliano, Mary K, DO  apixaban (ELIQUIS) 5 MG TABS tablet Take 1 tablet (5 mg total) by mouth 2 (two) times daily. 09/24/19  Yes Croitoru, Mihai, MD  benazepril (LOTENSIN) 10 MG tablet TAKE 1 TABLET(10 MG) BY MOUTH DAILY 07/13/19  Yes Burns, Claudina Lick, MD  Calcium Carbonate (CALTRATE 600  PO) Take 600 mg by mouth 2 (two) times daily.     Yes [provider]  docusate sodium (COLACE) 100 MG capsule Take 100 mg by mouth daily as needed for mild constipation.   Yes [provider]  doxycycline (VIBRAMYCIN) 100 MG capsule Take 1 capsule (100 mg total) by mouth 2 (two) times daily for 7 days. 10/20/19 10/27/19 Yes Dykstra, Ellwood Dense, MD  DULoxetine (CYMBALTA) 30 MG capsule Take 30 mg by mouth daily. 07/13/19  Yes [provider]  escitalopram (LEXAPRO) 5 MG tablet Take 1 tablet (5 mg total) by mouth daily. 10/16/19  Yes Burns, Claudina Lick, MD  Fexofenadine HCl (ALLEGRA PO) Take 180 mg by mouth at bedtime.    Yes [provider]  HYDROcodone-acetaminophen (NORCO/VICODIN) 5-325 MG tablet Take 1 tablet by mouth every 6 (six) hours as needed for moderate pain.  08/06/19  Yes [provider]  hydroxychloroquine (PLAQUENIL) 200 MG tablet Take 200 mg by mouth 2 (two) times daily.  05/07/16  Yes [provider]  Multiple Vitamins-Minerals (VISION-VITE PRESERVE PO) Take by mouth.   Yes [provider]  predniSONE (DELTASONE) 5 MG tablet Take 7.5 mg by mouth daily with breakfast.    Yes [provider]  simethicone (MYLICON) 0000000 MG chewable tablet Chew 125 mg by mouth every 6 (six) hours as needed for flatulence.   Yes [provider]  furosemide (LASIX) 40 MG tablet Take 1 tablet (40 mg total) by mouth daily. Patient taking differently: Take 20 mg by mouth daily.  06/29/19   Binnie Rail, MD    Scheduled Meds:  doxycycline  100 mg Oral BID   DULoxetine  30 mg Oral Daily   escitalopram  5 mg Oral Daily   hydrocortisone sod succinate (SOLU-CORTEF) inj  50 mg Intravenous Q6H   hydroxychloroquine  200 mg Oral BID   loratadine  10 mg Oral Daily   sodium chloride flush  3 mL Intravenous Q12H   Infusions:  sodium chloride     PRN Meds: sodium chloride, acetaminophen, HYDROcodone-acetaminophen, ondansetron **OR**  ondansetron (ZOFRAN) IV, sodium chloride flush   Allergies as of 10/22/2019 - Review Complete 10/22/2019  Allergen Reaction Noted   Arava [leflunomide]  09/25/2011   Sulfonamide derivatives     Remicade [infliximab] Hives and Itching 06/12/2016    Family History  Problem Relation  Age of Onset   Breast cancer Mother 46   Hypertension Mother    Gout Mother    Hypertension Father    Heart attack Father 28   Stroke Sister        doing well   Diabetes Maternal Aunt    Hypertension Son    Stroke Maternal Grandmother        late 30s    Social History   Socioeconomic History   Marital status: Married    Spouse name: Not on file   Number of children: Not on file   Years of education: Not on file   Highest education level: Not on file  Occupational History   Not on file  Tobacco Use   Smoking status: Never Smoker   Smokeless tobacco: Never Used  Substance and Sexual Activity   Alcohol use: Not Currently    Alcohol/week: 0.0 standard drinks    Comment:  rarely   Drug use: No   Sexual activity: Yes    Partners: Male    Birth control/protection: Surgical    Comment: TAH  Other Topics Concern   Not on file  Social History Narrative   Not on file   Social Determinants of Health   Financial Resource Strain:    Difficulty of Paying Living Expenses: Not on file  Food Insecurity:    Worried About Charity fundraiser in the Last Year: Not on file   [employer] of Food in the Last Year: Not on file  Transportation Needs:    Lack of Transportation (Medical): Not on file   Lack of Transportation (Non-Medical): Not on file  Physical Activity:    Days of Exercise per Week: Not on file   Minutes of Exercise per Session: Not on file  Stress:    Feeling of Stress : Not on file  Social Connections:    Frequency of Communication with Friends and Family: Not on file   Frequency of Social Gatherings with Friends and Family: Not on file   Attends  Religious Services: Not on file   Active Member of Clubs or Organizations: Not on file   Attends Archivist Meetings: Not on file   Marital Status: Not on file  Intimate Partner Violence:    Fear of Current or Ex-Partner: Not on file   Emotionally Abused: Not on file   Physically Abused: Not on file   Sexually Abused: Not on file    REVIEW OF SYSTEMS: Constitutional: Looks chronically ill ENT:  No nose bleeds Pulm: No cough, no dyspnea. CV:  No palpitations, no LE edema.  No angina. GU:  No hematuria, no frequency GI: No heartburn, no indigestion.  Depression related anorexia but no intolerance to p.o.  No dysphagia. Heme: Bruises easily but no unusual/excessive bleeding. Transfusions: None Neuro:  No headaches, no peripheral tingling or numbness.  No dizziness.  No seizures, no syncope. Derm: Redness on tops of her feet is not new. Endocrine:  No sweats or chills.  No polyuria or dysuria Immunization: Reviewed. Travel:  None beyond local counties in last few months.    PHYSICAL EXAM: Vital signs in last 24 hours: Vitals:   10/22/19 0930 10/22/19 1000  BP: (!) 101/56 (!) 100/55  Pulse: (!) 115   Resp: 16 14  Temp:    SpO2: 97%    Wt Readings from Last 3 Encounters:  10/20/19 58.5 kg  10/12/19 58.5 kg  09/24/19 58.5 kg    General: Chronically ill-appearing somewhat frail  elderly WF.  Actually she looks better than I expected. Head: No facial asymmetry or swelling.  No signs of head trauma. Eyes: Conjunctiva pink, sclera clear. Ears: Slightly HOH Nose: No discharge or congestion Mouth: Good dentition.  Tongue midline.  Mucosa moist, pink, clear. Neck: No mass, no JVD, no thyromegaly. Lungs: Clear bilaterally.  No labored breathing or cough. Heart: RRR.  No MRG.  S1, S2 present Abdomen: Soft.  Not tender, not distended no HSM, masses, bruits, hernias.  No organomegaly..   Rectal: Did not repeat exam performed by ED physician.  I did see the adult  diaper in the trash can as I left the room and this was full of red blood. Musc/Skeltl: Rheumatic arthritic changes in the fingers, feet, knees. Extremities: Nonpitting edema and redness with dried skin at the top of both feet.  She is wearing elastic pressure sock gangs from the heel to the knee. Neurologic: Oriented x3.  Moves all 4 limbs, strength not tested.  No tremors or gross deficits. Skin: No significant bruising Nodes: No cervical adenopathy. Psych: Calm, affect flat.  Cooperative.  Fluid speech.  Intake/Output from previous day: No intake/output data recorded. Intake/Output this shift: No intake/output data recorded.  LAB RESULTS: Recent Labs    10/20/19 1257 10/22/19 0600  WBC 13.7* 13.6*  HGB 10.8* 10.5*  HCT 35.5* 34.2*  PLT 399 392   BMET Lab Results  Component Value Date   NA 140 10/22/2019   NA 137 10/20/2019   NA 138 06/29/2019   K 4.0 10/22/2019   K 4.1 10/20/2019   K 4.7 06/29/2019   CL 105 10/22/2019   CL 98 10/20/2019   CL 101 06/29/2019   CO2 27 10/22/2019   CO2 26 10/20/2019   CO2 28 06/29/2019   GLUCOSE 99 10/22/2019   GLUCOSE 115 (H) 10/20/2019   GLUCOSE 114 (H) 06/29/2019   BUN 31 (H) 10/22/2019   BUN 42 (H) 10/20/2019   BUN 61 (H) 06/29/2019   CREATININE 1.22 (H) 10/22/2019   CREATININE 1.38 (H) 10/20/2019   CREATININE 1.68 (H) 06/29/2019   CALCIUM 9.3 10/22/2019   CALCIUM 9.5 10/20/2019   CALCIUM 9.3 06/29/2019   LFT Recent Labs    10/20/19 1257 10/22/19 0600  PROT 6.3* 5.9*  ALBUMIN 2.7* 2.4*  AST 23 19  ALT 22 22  ALKPHOS 78 65  BILITOT 0.2* 0.9   PT/INR Lab Results  Component Value Date   INR 1.5 (H) 10/22/2019     RADIOLOGY STUDIES: DG Chest 2 View  Result Date: 10/20/2019 CLINICAL DATA:  Weakness/fatigue, dry cough x 5 days. Pt Tachy w/bilateral lower lobe crackles in triage. Hx of HTN. No known lung conditions. Nonsmoker Imaging done in wheelchair, pt unable to stand. Chest done AP. Best images obtainable.  EXAM: CHEST - 2 VIEW COMPARISON:  12/27/2008 FINDINGS: Shallow lung inflation. Heart size is accentuated by the shallow lung volumes. There is new elevation of the RIGHT hemidiaphragm. Patchy opacity at the LATERAL LEFT lung base is consistent with early infiltrate. IMPRESSION: 1. Shallow lung inflation. 2. New elevation of the RIGHT hemidiaphragm. 3. Suspect early LEFT lower lobe infiltrate. 4. Followup PA and lateral chest X-ray is recommended in 3-4 weeks following trial of antibiotic therapy to ensure resolution of infiltrates and to evaluate elevated RIGHT hemidiaphragm. Electronically Signed   By: Nolon Nations M.D.   On: 10/20/2019 10:57     IMPRESSION:   *    Painless hematochezia.  The volume of blood she  is past seems much greater than would be expected from hemorrhoidal bleeding, she recalls a diagnosis of diverticulosis and this does sound more like a diverticular bleed.  *    Extensive R LE DVT, Eliquis started 11/13  *   Covid 19 + with PNA, on Doxycycline day 3  *   CKD.  BUN/creat today improved from recent previous readings  *    Rheumatoid arthritis with symptomatic C spine stenosis.   Chronic 10 mg Prednisone and Plaquenil.    *   Husband of 58 years died 51 month ago.      PLAN:     *    Case D/W Dr. Rush Landmark.  Ordering CTAP with oral contrast only, hopefully this can confirm diagnosis of diverticulosis.  At present with her multiple core morbidities, no plans for colonoscopy.   *   IVC filter planned today.     *    Follow CBC.      Azucena Freed  10/22/2019, 10:15 AM Phone [phone]

## 2019-10-22 NOTE — ED Provider Notes (Signed)
[facility] [place] EMERGENCY DEPARTMENT Provider Note   CSN: [id number] Arrival date & time: 10/22/19  [id number]     History Chief Complaint  Patient presents with  . Rectal Bleeding    Gloria Warren is a 85 y.o. female.  Patient with history of RA, recent DVT (Nov 2020, on Eliquis), HLD, HTN, COVID+ (09/20/19) presents with bleeding per rectum that started this morning. She started Her son, [name], reports he changed her adult diaper around midnight and then again around 4:00 am. He states there was no bleeding at either change, however, when he went to redress her at the 4:00 change, there was "blood everywhere". She denies abdominal pain, nausea, vomiting. She is not ambulatory secondary to the RA and uses a wheelchair. She states she had a colonoscopy in the distant past and has not seen a GI doctor since that time.   The history is provided by a relative and the patient. No language interpreter was used.  Rectal Bleeding Associated symptoms: no abdominal pain, no fever and no vomiting        Past Medical History:  Diagnosis Date  . Allergic rhinitis   . Decreased GFR   . DVT (deep venous thrombosis) ([place])   . Endometriosis   . History of skin cancer    squamous & basal cell ;Dr Denna Haggard  . Hyperlipidemia   . Hypertension   . Osteoarthritis   . Psoriasis   . Raynaud disease   . Rheumatoid arthritis(714.0)    Dr. Estanislado Pandy  . UTI (lower urinary tract infection) 5/15   Proteus , R to Nitrofurantoin & Penicillin    Patient Active Problem List   Diagnosis Date Noted  . COVID-19 10/21/2019  . DVT (deep venous thrombosis) ([place]) 10/12/2019  . Pain and swelling of right lower leg 09/24/2019  . Nausea 06/29/2019  . Vitamin D deficiency 06/29/2019  . Osteoarthritis of both feet 09/25/2016  . Raynaud disease 09/25/2016  . Primary osteoarthritis of both knees 09/24/2016  . Primary osteoarthritis of both hands 09/24/2016  . Chronic kidney disease (CKD) stage G3b  09/24/2016  . Leg edema 11/28/2015  . Psoriasis 03/11/2013  . SKIN CANCER, HX OF 06/26/2010  . Allergic rhinitis 06/23/2009  . Prediabetes 06/23/2009  . Rheumatoid arthritis ([place]) 02/25/2008  . Hyperlipidemia 11/17/2007  . Essential hypertension 11/17/2007    Past Surgical History:  Procedure Laterality Date  . APPENDECTOMY     age 27  . CATARACT EXTRACTION Bilateral   . COLONOSCOPY     Neg 2; [place] , [place]  . G 2  P 2    . TONSILLECTOMY    . TOTAL ABDOMINAL HYSTERECTOMY     age 72 for endometriosis (no BSO)     OB History    Gravida  2   Para  2   Term  2   Preterm      AB      Living  1     SAB      TAB      Ectopic      Multiple      Live Births           Obstetric Comments  Daughter passed away in February 19, 2005 to AVM.        Family History  Problem Relation Age of Onset  . Breast cancer Mother 18  . Hypertension Mother   . Gout Mother   . Hypertension Father   . Heart attack Father 1  . Stroke  Sister        doing well  . Diabetes Maternal Aunt   . Hypertension Son   . Stroke Maternal Grandmother        late 76s    Social History   Tobacco Use  . Smoking status: Never Smoker  . Smokeless tobacco: Never Used  Substance Use Topics  . Alcohol use: Not Currently    Alcohol/week: 0.0 standard drinks    Comment:  rarely  . Drug use: No    Home Medications Prior to Admission medications   Medication Sig Start Date End Date Taking? Authorizing Provider  albuterol (PROVENTIL) (2.5 MG/3ML) 0.083% nebulizer solution Take 3 mLs (2.5 mg total) by nebulization every 6 (six) hours as needed for wheezing. 08/21/19   Cirigliano, Garvin Fila, DO  apixaban (ELIQUIS) 5 MG TABS tablet Take 1 tablet (5 mg total) by mouth 2 (two) times daily. 09/24/19   Croitoru, Mihai, MD  benazepril (LOTENSIN) 10 MG tablet TAKE 1 TABLET(10 MG) BY MOUTH DAILY 07/13/19   Burns, Claudina Lick, MD  Calcium Carbonate (CALTRATE 600 PO) Take 600 mg by mouth 2 (two) times daily.       [provider]  cholecalciferol (VITAMIN D) 1000 units tablet Take 1,000 Units by mouth daily.    [provider]  citalopram (CELEXA) 10 MG tablet Take 5 mg by mouth daily.    [provider]  doxycycline (VIBRAMYCIN) 100 MG capsule Take 1 capsule (100 mg total) by mouth 2 (two) times daily for 7 days. 10/20/19 10/27/19  Lucrezia Starch, MD  escitalopram (LEXAPRO) 5 MG tablet Take 1 tablet (5 mg total) by mouth daily. 10/16/19   Binnie Rail, MD  Fexofenadine HCl (ALLEGRA PO) Take 180 mg by mouth at bedtime.     [provider]  furosemide (LASIX) 40 MG tablet Take 1 tablet (40 mg total) by mouth daily. Patient taking differently: Take 20 mg by mouth daily.  06/29/19   Binnie Rail, MD  HYDROcodone-acetaminophen (NORCO/VICODIN) 5-325 MG tablet TAKE ONE TABLET BY MOUTH EVERY 6 HOURS PRN 08/06/19   [provider]  hydroxychloroquine (PLAQUENIL) 200 MG tablet Take 200 mg by mouth 2 (two) times daily.  05/07/16   [provider]  Multiple Vitamins-Minerals (VISION-VITE PRESERVE PO) Take by mouth.    [provider]  predniSONE (DELTASONE) 20 MG tablet Take 10 mg by mouth daily with breakfast.     [provider]    Allergies    Arava [leflunomide], Sulfonamide derivatives, and Remicade [infliximab]  Review of Systems   Review of Systems  Constitutional: Negative for chills and fever.  HENT: Negative.   Respiratory: Negative.  Negative for shortness of breath.   Cardiovascular: Negative.   Gastrointestinal: Positive for anal bleeding, blood in stool, diarrhea and hematochezia. Negative for abdominal pain, nausea and vomiting.  Musculoskeletal: Negative.  Negative for myalgias.  Skin: Negative.   Neurological: Positive for weakness (Generalized (since dx of COVID)).    Physical Exam Updated Vital Signs BP 105/62   Pulse (!) 118   Temp 98.1 F (36.7 C) (Oral)   Resp 13   SpO2 99%   Physical Exam Vitals and  nursing note reviewed.  Constitutional:      Appearance: She is well-developed.  HENT:     Head: Normocephalic.  Cardiovascular:     Rate and Rhythm: Normal rate and regular rhythm.     Heart sounds: No murmur.  Pulmonary:     Effort: Pulmonary effort  is normal.     Breath sounds: Normal breath sounds. No wheezing, rhonchi or rales.  Abdominal:     General: Bowel sounds are normal.     Palpations: Abdomen is soft.     Tenderness: There is no abdominal tenderness. There is no guarding or rebound.  Genitourinary:    Comments: Significant amount of blood present at rectum.  Musculoskeletal:        General: Normal range of motion.     Cervical back: Normal range of motion and neck supple.     Comments: LE's swollen bilaterally with chronic appearing changes, compression stockings in place.   Skin:    General: Skin is warm and dry.     Findings: No rash.  Neurological:     Mental Status: She is alert and oriented to person, place, and time.     ED Results / Procedures / Treatments   Labs (all labs ordered are listed, but only abnormal results are displayed) Labs Reviewed  CBC WITH DIFFERENTIAL/PLATELET - Abnormal; Notable for the following components:      Result Value   WBC 13.6 (*)    RBC 3.50 (*)    Hemoglobin 10.5 (*)    HCT 34.2 (*)    RDW 15.6 (*)    Neutro Abs 10.5 (*)    Monocytes Absolute 1.2 (*)    All other components within normal limits  COMPREHENSIVE METABOLIC PANEL  PROTIME-INR  POC OCCULT BLOOD, ED  TYPE AND SCREEN   Results for orders placed or performed during the hospital encounter of 10/22/19  CBC WITH DIFFERENTIAL  Result Value Ref Range   WBC 13.6 (H) 4.0 - 10.5 K/uL   RBC 3.50 (L) 3.87 - 5.11 MIL/uL   Hemoglobin 10.5 (L) 12.0 - 15.0 g/dL   HCT 34.2 (L) 36.0 - 46.0 %   MCV 97.7 80.0 - 100.0 fL   MCH 30.0 26.0 - 34.0 pg   MCHC 30.7 30.0 - 36.0 g/dL   RDW 15.6 (H) 11.5 - 15.5 %   Platelets 392 150 - 400 K/uL   nRBC 0.0 0.0 - 0.2 %    Neutrophils Relative % 77 %   Neutro Abs 10.5 (H) 1.7 - 7.7 K/uL   Lymphocytes Relative 13 %   Lymphs Abs 1.7 0.7 - 4.0 K/uL   Monocytes Relative 9 %   Monocytes Absolute 1.2 (H) 0.1 - 1.0 K/uL   Eosinophils Relative 0 %   Eosinophils Absolute 0.0 0.0 - 0.5 K/uL   Basophils Relative 0 %   Basophils Absolute 0.1 0.0 - 0.1 K/uL   Immature Granulocytes 1 %   Abs Immature Granulocytes 0.07 0.00 - 0.07 K/uL  Type and screen [place]  Result Value Ref Range   ABO/RH(D) PENDING    Antibody Screen PENDING    Sample Expiration      10/25/2019,2359 Performed at [facility], [address], [place], [place] [address]      EKG None  Radiology DG Chest 2 View  Result Date: 10/20/2019 CLINICAL DATA:  Weakness/fatigue, dry cough x 5 days. Pt Tachy w/bilateral lower lobe crackles in triage. Hx of HTN. No known lung conditions. Nonsmoker Imaging done in wheelchair, pt unable to stand. Chest done AP. Best images obtainable. EXAM: CHEST - 2 VIEW COMPARISON:  12/27/2008 FINDINGS: Shallow lung inflation. Heart size is accentuated by the shallow lung volumes. There is new elevation of the RIGHT hemidiaphragm. Patchy opacity at the LATERAL LEFT lung base is consistent  with early infiltrate. IMPRESSION: 1. Shallow lung inflation. 2. New elevation of the RIGHT hemidiaphragm. 3. Suspect early LEFT lower lobe infiltrate. 4. Followup PA and lateral chest X-ray is recommended in 3-4 weeks following trial of antibiotic therapy to ensure resolution of infiltrates and to evaluate elevated RIGHT hemidiaphragm. Electronically Signed   By: Nolon Nations M.D.   On: 10/20/2019 10:57    Procedures Procedures (including critical care time) CRITICAL CARE Performed by: Dewaine Oats   Total critical care time: 40 minutes  Critical care time was exclusive of separately billable procedures and treating other patients.  Critical care was necessary to treat or prevent imminent or  life-threatening deterioration.  Critical care was time spent personally by me on the following activities: development of treatment plan with patient and/or surrogate as well as nursing, discussions with consultants, evaluation of patient's response to treatment, examination of patient, obtaining history from patient or surrogate, ordering and performing treatments and interventions, ordering and review of laboratory studies, ordering and review of radiographic studies, pulse oximetry and re-evaluation of patient's condition.  Medications Ordered in ED Medications  coag fact Xa recombinant (ANDEXXA) low dose infusion 900 mg (has no administration in time range)  sodium chloride 0.9 % bolus 1,000 mL (has no administration in time range)    ED Course  I have reviewed the triage vital signs and the nursing notes.  Pertinent labs & imaging results that were available during my care of the patient were reviewed by me and considered in my medical decision making (see chart for details).    MDM Rules/Calculators/A&P     CHA2DS2/VAS Stroke Risk Points      N/A >= 2 Points: High Risk  1 - 1.99 Points: Medium Risk  0 Points: Low Risk    A final score could not be computed because of missing components.: Last  Change: N/A     This score determines the patient's risk of having a stroke if the  patient has atrial fibrillation.      This score is not applicable to this patient. Components are not  calculated.                   Patient to ED with new onset lower GI bleeding that started around 4:00 am this morning. She denies pain, N, V. No known history of GI bleeding other than h/o hemorrhoids.   Patient is immunocompromised on Plaquenil. She is also anticoagulated with Eliquis for recent dx DVT - last dose was last night around 7:00 pm (10/21/19). Patient diagnosed with COVID 2 days ago but was stable for discharge home at that time. She denies SOB, fever, cough currently.   Given the active  GI bleeding, pharmacy consulted to reverse Eliquis. IV's established. Additional history obtained by phone from the patient's son, [name], who is also HCPOA. Of note, he states the patient's blood pressure usually runs as low at [id number] systolic on a regular basis.   GI contacted for consultation. Discussed the patient with Dr. Acie Fredrickson GI, who will see the patient in the ED.   Discussed admission with Dr. Lorin Mercy, [facility], who accepts the patient on to her service.    Final Clinical Impression(s) / ED Diagnoses Final diagnoses:  None   1. Lower GI bleeding 2. Coagulopathy 3. COVID positive patient (10/20/19) 4. Immunocompromised, on Plaquenil  Rx / DC Orders ED Discharge Orders    None       Charlann Lange, PA-C 10/22/19 [id number]  Veryl Speak, MD 10/23/19 [phone]

## 2019-10-22 NOTE — ED Notes (Signed)
OR RNs at bedside to transport patient up to OR

## 2019-10-22 NOTE — H&P (Signed)
History and Physical    Gloria Warren [id number] DOB: 1930/12/31 DOA: 10/22/2019  PCP: Binnie Rail, MD Consultants:  Croitoru - cardiology; Donzetta Matters - vascular; neurosurgery Patient coming from:  Home - lives with son and has full-time caregivers; NOK: [place], [place], [phone]  Chief Complaint: GI bleeding  HPI: Gloria Warren is a 85 y.o. female with medical history significant of RA; Raynaud's; HTN; HLD; Stage 3b-4 CKD; and DVT on Eliquis presenting with bloody stools. Patient reports having a very rough time.  Patient was previously seen in the ER on 12/8 with c/o weakness x 5 days and cough and scratchy throat x 1 day.  She was diagnosed with COVID-19 but was well-appearing and so discharged to home.  Of note, DVT was diagnosed on 10/10/2023 and she was started on Eliquis.  DVT US showed extensive clot burden from external iliac to distal femoral vein.  There was consideration for IVC filter.  Also of note, the patient's husband died in Hospice care on 10-Oct-2023.  She is still clearly grieving, and has very limited mobility due to her RA.  She recently saw neurosurgery and they determined that surgery cannot be performed for at least a year due to her clot.  Meanwhile, overnight she developed acute onset of BRBPR without pain.  She is currently having severe pain associated with her baseline RA.   ED Course:  Lower GI bleeding starting at 0400.  Has advanced RA.  Significant blood in pull-up around 4AM.  On Eliquis for DVT and this has been reversed.  Also with COVID.  Gi, Dr. Therisa Doyne, will see in the ER.  Review of Systems: As per HPI; otherwise review of systems reviewed and negative.   Ambulatory Status: Bed- or wheelchair bound  Past Medical History:  Diagnosis Date  . Allergic rhinitis   . Decreased GFR   . DVT (deep venous thrombosis) ([place])   . Endometriosis   . History of skin cancer    squamous & basal cell ;Dr Denna Haggard  . Hyperlipidemia   . Hypertension   .  Osteoarthritis   . Psoriasis   . Raynaud disease   . Rheumatoid arthritis(714.0)    Dr. Estanislado Pandy  . UTI (lower urinary tract infection) 5/15   Proteus , R to Nitrofurantoin & Penicillin    Past Surgical History:  Procedure Laterality Date  . APPENDECTOMY     age 15  . CATARACT EXTRACTION Bilateral   . COLONOSCOPY     Neg 2; [place] , [place]  . G 2  P 2    . TONSILLECTOMY    . TOTAL ABDOMINAL HYSTERECTOMY     age 39 for endometriosis (no BSO)    Social History   Socioeconomic History  . Marital status: Married    Spouse name: Not on file  . Number of children: Not on file  . Years of education: Not on file  . Highest education level: Not on file  Occupational History  . Not on file  Tobacco Use  . Smoking status: Never Smoker  . Smokeless tobacco: Never Used  Substance and Sexual Activity  . Alcohol use: Not Currently    Alcohol/week: 0.0 standard drinks    Comment:  rarely  . Drug use: No  . Sexual activity: Yes    Partners: Male    Birth control/protection: Surgical    Comment: TAH  Other Topics Concern  . Not on file  Social History Narrative  . Not on file   Social  Determinants of Health   Financial Resource Strain:   . Difficulty of Paying Living Expenses: Not on file  Food Insecurity:   . Worried About Charity fundraiser in the Last Year: Not on file  . Ran Out of Food in the Last Year: Not on file  Transportation Needs:   . Lack of Transportation (Medical): Not on file  . Lack of Transportation (Non-Medical): Not on file  Physical Activity:   . Days of Exercise per Week: Not on file  . Minutes of Exercise per Session: Not on file  Stress:   . Feeling of Stress : Not on file  Social Connections:   . Frequency of Communication with Friends and Family: Not on file  . Frequency of Social Gatherings with Friends and Family: Not on file  . Attends Religious Services: Not on file  . Active Member of Clubs or Organizations: Not on file  . Attends  Archivist Meetings: Not on file  . Marital Status: Not on file  Intimate Partner Violence:   . Fear of Current or Ex-Partner: Not on file  . Emotionally Abused: Not on file  . Physically Abused: Not on file  . Sexually Abused: Not on file    Allergies  Allergen Reactions  . Arava [Leflunomide]     Liver & kidney dysfunction  . Sulfonamide Derivatives     Itching & rash  . Remicade [Infliximab] Hives and Itching    Family History  Problem Relation Age of Onset  . Breast cancer Mother 35  . Hypertension Mother   . Gout Mother   . Hypertension Father   . Heart attack Father 20  . Stroke Sister        doing well  . Diabetes Maternal Aunt   . Hypertension Son   . Stroke Maternal Grandmother        late 57s    Prior to Admission medications   Medication Sig Start Date End Date Taking? Authorizing Provider  albuterol (PROVENTIL) (2.5 MG/3ML) 0.083% nebulizer solution Take 3 mLs (2.5 mg total) by nebulization every 6 (six) hours as needed for wheezing. 08/21/19   Cirigliano, Garvin Fila, DO  apixaban (ELIQUIS) 5 MG TABS tablet Take 1 tablet (5 mg total) by mouth 2 (two) times daily. 09/24/19   Croitoru, Mihai, MD  benazepril (LOTENSIN) 10 MG tablet TAKE 1 TABLET(10 MG) BY MOUTH DAILY 07/13/19   Burns, Claudina Lick, MD  Calcium Carbonate (CALTRATE 600 PO) Take 600 mg by mouth 2 (two) times daily.      [provider]  cholecalciferol (VITAMIN D) 1000 units tablet Take 1,000 Units by mouth daily.    [provider]  citalopram (CELEXA) 10 MG tablet Take 5 mg by mouth daily.    [provider]  doxycycline (VIBRAMYCIN) 100 MG capsule Take 1 capsule (100 mg total) by mouth 2 (two) times daily for 7 days. 10/20/19 10/27/19  Lucrezia Starch, MD  escitalopram (LEXAPRO) 5 MG tablet Take 1 tablet (5 mg total) by mouth daily. 10/16/19   Binnie Rail, MD  Fexofenadine HCl (ALLEGRA PO) Take 180 mg by mouth at bedtime.     [provider]  furosemide  (LASIX) 40 MG tablet Take 1 tablet (40 mg total) by mouth daily. Patient taking differently: Take 20 mg by mouth daily.  06/29/19   Binnie Rail, MD  HYDROcodone-acetaminophen (NORCO/VICODIN) 5-325 MG tablet TAKE ONE TABLET BY MOUTH EVERY 6 HOURS PRN 08/06/19   [provider]  hydroxychloroquine (PLAQUENIL) 200 MG tablet Take 200 mg by mouth 2 (two) times daily.  05/07/16   [provider]  Multiple Vitamins-Minerals (VISION-VITE PRESERVE PO) Take by mouth.    [provider]  predniSONE (DELTASONE) 20 MG tablet Take 10 mg by mouth daily with breakfast.     [provider]    Physical Exam: Vitals:   10/22/19 1630 10/22/19 1700 10/22/19 1730 10/22/19 1800  BP: (!) 102/59 107/69 117/77 (!) 120/58  Pulse: (!) 120 (!) 121 (!) 116 (!) 114  Resp: 16 16 12 16   Temp:      TempSrc:      SpO2: 99% 100% 97% 96%     . General:  Appears calm and comfortable and is NAD, emotionally labile . Eyes:  PERRL, EOMI, normal lids, iris . ENT:  grossly normal hearing, lips & tongue, mmm . Neck:  no LAD, masses or thyromegaly . Cardiovascular:  RR with mild tachycardia, no m/r/g. 1-2+ primarily distal LE edema with B compression stockings in place . Respiratory:   CTA bilaterally with no wheezes/rales/rhonchi.  Normal respiratory effort. . Abdomen:  soft, NT, ND, NABS . Back:   normal alignment, no CVAT . Skin:  no rash or induration seen on limited exam . Musculoskeletal:  atrophic BUE/BLE, no bony abnormality . Psychiatric:  Depressed/emotionally labile mood and affect, speech fluent and appropriate, AOx3 Neurologic:  CN 2-12 grossly intact    Radiological Exams on Admission: HYBRID OR IMAGING ([place])  Result Date: 10/22/2019 There is no interpretation for this exam.  This order is for images obtained during a surgical procedure.  Please See "Surgeries" Tab for more information regarding the procedure.    EKG: Independently reviewed.  Sinus tachycardia with  rate 138;  no evidence of acute ischemia   Labs on Admission: I have personally reviewed the available labs and imaging studies at the time of the admission.  Pertinent labs:   BUN 31/Creatinine 1.22/GFR 40 WBC 13.6 Hgb 10.5; 10.8 on 12/8 INR 1.5 LDH 167 Ferritin 113 CRP 15.7 Procalcitonin 0.25 D-dimer 2.26 Fibrinogen 631  Assessment/Plan Principal Problem:   Acute lower GI bleeding Active Problems:   Essential hypertension   Rheumatoid arthritis (HCC)   Chronic kidney disease (CKD) stage G3b   DVT (deep venous thrombosis) (HCC)   COVID-19   Depression   Acute lower GI bleeding -h/o diverticular bleeding 5-6 years ago -Patient with acute BRBPR this AM about 0400 -Differential to include bleeding hemorrhoids (given patient's history of hemorrhoids),diverticular bleeding and AVM (less likely) -She is afebrile at this time with tachycardia -Inpatient admission to progressive care unit due to complexity of conditions at this time -CBC currently stable, will follow; transfuse for Hgb <7 -Continue to monitor for recurrent bleeding  -GI to see -Appears unlikely to need procedure if no further bleeding  COVID-19 infection -Patient with recent diagnosis of COVID-19 infection, being treated with doxy (will continue for now) -COVID POSITIVE -Her primary symptom appears to be diarrhea, which preceded her GI bleeding episode -The patient has comorbidities which may increase the risk for ARDS/MODS including: age, HTN, immunosuppression -Pertinent labs concerning for COVID include increased BUN/Creatinine;  markedly elevated D-dimer (>1); markedly elevated CRP (>7); increased fibrinogen -Will check CXR -Will continue Doxy given procalcitonin >0.1; consider broad spectrum if her respiratory condition declines (currently without symptoms).   -Will admit to [facility] progressive care unit for further evaluation, close monitoring, and treatment -At this time, will attempt to avoid use of  aerosolized medications and use HFAs instead -Will check daily labs including BMP with Mag, Phos; LFTs; CBC with differential; CRP; ferritin; fibrinogen; D-dimer -Will order stress-dosed steroids with hydrocortisone 50 mg IV q6h -If the patient shows clinical deterioration, consider transfer to ICU with PCCM consultation -Patient was seen wearing full PPE including: gown, gloves, head cover, N95, and face shield; donning and doffing was in compliance with current standards.  DVT on Eliquis -Patient with significant clot burden including proximal clot on 10-08-2023 -This is likely related to her severe immobility associated with advanced RA -She has received several weeks of AC and so there is now less chance for embolization -However, given inability to use AC due to GIB, she needs IVC filter. -Vascular surgery consulted and plan was for permanent filter placement this afternoon  RA -Advanced disease with severe debility -Likely to benefit from PT/OT consults once her acute issues are stabilized -Stress-dosed steroids, as noted above -Continue home pain meds -Continue Plaquenil  HTN -Continue Lotensin  Acute on chronic depression -Patient with significant emotional lability at the time of my evaluation -Her husband became ill a couple of months ago and died under hospice care on 2023/10/08 without a clear diagnosis -She is still devastated and grieving -Her health issues are exacerbating this -Continue home Cymbalta and Lexapro -If not improving, consider psych consultation  CKD stage 3b -Appears to be stable at this time, will follow   DVT prophylaxis:  SCDs  Code Status:  Full - confirmed with patient Family Communication: None present; I spoke with the patient's son by telephone. Disposition Plan:  Home once clinically improved Consults called: GI; vascular surgery  Admission status: Admit - It is my clinical opinion that admission to INPATIENT is reasonable and necessary because of  the expectation that this patient will require hospital care that crosses at least 2 midnights to treat this condition based on the medical complexity of the problems presented.  Given the aforementioned information, the predictability of an adverse outcome is felt to be significant.    Karmen Bongo MD [facility]   How to contact the [facility] Attending or Consulting provider [place] or covering provider during after hours [place], for this patient?  1. Check the care team in [facility] and look for a) attending/consulting [facility] provider listed and b) the [facility] team listed 2. Log into [email or web address] and use [place]'s universal password to access. If you do not have the password, please contact the hospital operator. 3. Locate the [facility] provider you are looking for under [facility] and page to a number that you can be directly reached. 4. If you still have difficulty reaching the provider, please page the [facility] (Director on Call) for the Hospitalists listed on amion for assistance.   10/22/2019, 6:10 PM

## 2019-10-22 NOTE — ED Notes (Signed)
Dr Rosendo Gros at bedside, MD made aware of patient's request for pain meds

## 2019-10-22 NOTE — Anesthesia Preprocedure Evaluation (Signed)
Anesthesia Evaluation  Patient identified by MRN, date of birth, ID band Patient awake    Reviewed: Allergy & Precautions, NPO status , Patient's Chart, lab work & pertinent test results  Airway Mallampati: II  TM Distance: >3 FB     Dental no notable dental hx. (+) Dental Advisory Given   Pulmonary neg pulmonary ROS,    Pulmonary exam normal        Cardiovascular hypertension, + DVT  Normal cardiovascular exam     Neuro/Psych negative neurological ROS  negative psych ROS   GI/Hepatic negative GI ROS, Neg liver ROS,   Endo/Other  negative endocrine ROS  Renal/GU negative Renal ROS  negative genitourinary   Musculoskeletal negative musculoskeletal ROS (+)   Abdominal   Peds negative pediatric ROS (+)  Hematology negative hematology ROS (+)   Anesthesia Other Findings   Reproductive/Obstetrics negative OB ROS                             Anesthesia Physical Anesthesia Plan  ASA: II  Anesthesia Plan: MAC   Post-op Pain Management:    Induction:   PONV Risk Score and Plan:   Airway Management Planned: Natural Airway  Additional Equipment:   Intra-op Plan:   Post-operative Plan:   Informed Consent: I have reviewed the patients History and Physical, chart, labs and discussed the procedure including the risks, benefits and alternatives for the proposed anesthesia with the patient or authorized representative who has indicated his/her understanding and acceptance.     Dental advisory given  Plan Discussed with: CRNA and Anesthesiologist  Anesthesia Plan Comments:         Anesthesia Quick Evaluation

## 2019-10-22 NOTE — ED Notes (Signed)
Patient transported to OR with OR RNs

## 2019-10-23 ENCOUNTER — Inpatient Hospital Stay (HOSPITAL_COMMUNITY): Payer: Medicare Other

## 2019-10-23 DIAGNOSIS — K921 Melena: Secondary | ICD-10-CM

## 2019-10-23 DIAGNOSIS — K631 Perforation of intestine (nontraumatic): Secondary | ICD-10-CM

## 2019-10-23 DIAGNOSIS — I10 Essential (primary) hypertension: Secondary | ICD-10-CM

## 2019-10-23 DIAGNOSIS — R935 Abnormal findings on diagnostic imaging of other abdominal regions, including retroperitoneum: Secondary | ICD-10-CM

## 2019-10-23 LAB — COMPREHENSIVE METABOLIC PANEL
ALT: 21 U/L (ref 0–44)
AST: 21 U/L (ref 15–41)
Albumin: 2.5 g/dL — ABNORMAL LOW (ref 3.5–5.0)
Alkaline Phosphatase: 58 U/L (ref 38–126)
Anion gap: 10 (ref 5–15)
BUN: 21 mg/dL (ref 8–23)
CO2: 25 mmol/L (ref 22–32)
Calcium: 8.8 mg/dL — ABNORMAL LOW (ref 8.9–10.3)
Chloride: 106 mmol/L (ref 98–111)
Creatinine, Ser: 1.03 mg/dL — ABNORMAL HIGH (ref 0.44–1.00)
GFR calc Af Amer: 57 mL/min — ABNORMAL LOW (ref >60)
GFR calc non Af Amer: 49 mL/min — ABNORMAL LOW (ref >60)
Glucose, Bld: 83 mg/dL (ref 70–99)
Potassium: 4.4 mmol/L (ref 3.5–5.1)
Sodium: 141 mmol/L (ref 135–145)
Total Bilirubin: 0.9 mg/dL (ref 0.3–1.2)
Total Protein: 5.5 g/dL — ABNORMAL LOW (ref 6.5–8.1)

## 2019-10-23 LAB — CBC WITH DIFFERENTIAL/PLATELET
Abs Immature Granulocytes: 0.07 10*3/uL (ref 0.00–0.07)
Basophils Absolute: 0 10*3/uL (ref 0.0–0.1)
Basophils Relative: 0 %
Eosinophils Absolute: 0 10*3/uL (ref 0.0–0.5)
Eosinophils Relative: 0 %
HCT: 30.4 % — ABNORMAL LOW (ref 36.0–46.0)
Hemoglobin: 9.2 g/dL — ABNORMAL LOW (ref 12.0–15.0)
Immature Granulocytes: 1 %
Lymphocytes Relative: 8 %
Lymphs Abs: 0.9 10*3/uL (ref 0.7–4.0)
MCH: 29.1 pg (ref 26.0–34.0)
MCHC: 30.3 g/dL (ref 30.0–36.0)
MCV: 96.2 fL (ref 80.0–100.0)
Monocytes Absolute: 0.6 10*3/uL (ref 0.1–1.0)
Monocytes Relative: 5 %
Neutro Abs: 9 10*3/uL — ABNORMAL HIGH (ref 1.7–7.7)
Neutrophils Relative %: 86 %
Platelets: 383 10*3/uL (ref 150–400)
RBC: 3.16 MIL/uL — ABNORMAL LOW (ref 3.87–5.11)
RDW: 15.4 % (ref 11.5–15.5)
WBC: 10.6 10*3/uL — ABNORMAL HIGH (ref 4.0–10.5)
nRBC: 0 % (ref 0.0–0.2)

## 2019-10-23 LAB — C-REACTIVE PROTEIN: CRP: 15.5 mg/dL — ABNORMAL HIGH (ref <1.0)

## 2019-10-23 LAB — PHOSPHORUS: Phosphorus: 3.6 mg/dL (ref 2.5–4.6)

## 2019-10-23 LAB — FERRITIN: Ferritin: 104 ng/mL (ref 11–307)

## 2019-10-23 LAB — D-DIMER, QUANTITATIVE: D-Dimer, Quant: 2.66 ug/mL-FEU — ABNORMAL HIGH (ref 0.00–0.50)

## 2019-10-23 LAB — MAGNESIUM: Magnesium: 1.9 mg/dL (ref 1.7–2.4)

## 2019-10-23 MED ORDER — PREDNISONE 5 MG PO TABS
7.5000 mg | ORAL_TABLET | Freq: Every day | ORAL | Status: DC
  Administered 2019-10-24 – 2019-10-28 (×5): 7.5 mg via ORAL
  Filled 2019-10-23 (×5): qty 2

## 2019-10-23 MED ORDER — PIPERACILLIN-TAZOBACTAM 3.375 G IVPB
3.3750 g | Freq: Three times a day (TID) | INTRAVENOUS | Status: DC
  Administered 2019-10-23 – 2019-10-28 (×15): 3.375 g via INTRAVENOUS
  Filled 2019-10-23 (×15): qty 50

## 2019-10-23 NOTE — Consult Note (Addendum)
Gloria Warren Consult/Admission Note  Gloria Warren 1931/06/27  [id number].    Requesting Provider: Alonza Bogus, PA-C Chief Complaint/Reason for Consult: Contained rectal perforation  HPI:  Patient is an 85 year old female with a history of DVT on Eliquis, HTN, osteoarthritis, RA on prednisone, Raynaud diease who presented to the emergency department with complaints of bloody stools.  Patient was seen in the ER on 12/8 complaining of weakness, cough and scratchy throat.  She was diagnosed with Covid but was well-appearing and was discharged home.  DVT was diagnosed on 11/12 and she was started on Eliquis.  DVTs in her external iliac to distal femoral vein.  Patient has limited mobility 2/2 her RA.  She states she is unable to walk on her own.  Patient feels that her stools were normal prior to 2 days ago when she had diarrhea with blood.  She denied any abdominal pain, nausea, vomiting or other associated symptoms.  Patient denies SOB or CP.  She states she does not feel symptomatic from her Covid.  Of note: Patient had placement of inferior vena cava filter yesterday by Gloria Warren.  Pt has a history of total abdominal hysterectomy and appendectomy.  Work-up revealed contained rectal perforation with abundant stool outside of the rectal and colonic lumen.  Sigmoid diverticular disease.   ROS:  Review of Systems  Constitutional: Negative for chills, diaphoresis and fever.  HENT: Negative for sore throat.   Respiratory: Negative for cough and shortness of breath.   Cardiovascular: Negative for chest pain.  Gastrointestinal: Positive for blood in stool. Negative for abdominal pain, constipation, diarrhea, nausea and vomiting.  Genitourinary: Negative for dysuria.  Skin: Negative for rash.  Neurological: Negative for dizziness and loss of consciousness.  All other systems reviewed and are negative.    Family History  Problem Relation Age of Onset  . Breast cancer Mother 34   . Hypertension Mother   . Gout Mother   . Hypertension Father   . Heart attack Father 65  . Stroke Sister        doing well  . Diabetes Maternal Aunt   . Hypertension Son   . Stroke Maternal Grandmother        late 52s    Past Medical History:  Diagnosis Date  . Allergic rhinitis   . Decreased GFR   . DVT (deep venous thrombosis) ([place])   . Endometriosis   . History of skin cancer    squamous & basal cell ;Dr Denna Haggard  . Hyperlipidemia   . Hypertension   . Osteoarthritis   . Psoriasis   . Raynaud disease   . Rheumatoid arthritis(714.0)    Dr. Estanislado Pandy  . UTI (lower urinary tract infection) 5/15   Proteus , R to Nitrofurantoin & Penicillin    Past Surgical History:  Procedure Laterality Date  . APPENDECTOMY     age 21  . CATARACT EXTRACTION Bilateral   . COLONOSCOPY     Neg 2; [place] , [place]  . G 2  P 2    . TONSILLECTOMY    . TOTAL ABDOMINAL HYSTERECTOMY     age 69 for endometriosis (no BSO)    Social History:  reports that she has never smoked. She has never used smokeless tobacco. She reports previous alcohol use. She reports that she does not use drugs.  Allergies:  Allergies  Allergen Reactions  . Arava [Leflunomide]     Liver & kidney dysfunction  . Sulfonamide Derivatives     Itching &  rash  . Remicade [Infliximab] Hives and Itching    Medications Prior to Admission  Medication Sig Dispense Refill  . albuterol (PROVENTIL) (2.5 MG/3ML) 0.083% nebulizer solution Take 3 mLs (2.5 mg total) by nebulization every 6 (six) hours as needed for wheezing. 75 mL 1  . apixaban (ELIQUIS) 5 MG TABS tablet Take 1 tablet (5 mg total) by mouth 2 (two) times daily. 60 tablet 11  . benazepril (LOTENSIN) 10 MG tablet TAKE 1 TABLET(10 MG) BY MOUTH DAILY 90 tablet 1  . Calcium Carbonate (CALTRATE 600 PO) Take 600 mg by mouth 2 (two) times daily.      Marland Kitchen docusate sodium (COLACE) 100 MG capsule Take 100 mg by mouth daily as needed for mild constipation.    Marland Kitchen doxycycline  (VIBRAMYCIN) 100 MG capsule Take 1 capsule (100 mg total) by mouth 2 (two) times daily for 7 days. 14 capsule 0  . DULoxetine (CYMBALTA) 30 MG capsule Take 30 mg by mouth daily.    Marland Kitchen escitalopram (LEXAPRO) 5 MG tablet Take 1 tablet (5 mg total) by mouth daily. 30 tablet 5  . Fexofenadine HCl (ALLEGRA PO) Take 180 mg by mouth at bedtime.     Marland Kitchen HYDROcodone-acetaminophen (NORCO/VICODIN) 5-325 MG tablet Take 1 tablet by mouth every 6 (six) hours as needed for moderate pain.     . hydroxychloroquine (PLAQUENIL) 200 MG tablet Take 200 mg by mouth 2 (two) times daily.   0  . Multiple Vitamins-Minerals (VISION-VITE PRESERVE PO) Take by mouth.    . predniSONE (DELTASONE) 5 MG tablet Take 7.5 mg by mouth daily with breakfast.     . simethicone (MYLICON) 0000000 MG chewable tablet Chew 125 mg by mouth every 6 (six) hours as needed for flatulence.    . furosemide (LASIX) 40 MG tablet Take 1 tablet (40 mg total) by mouth daily. (Patient taking differently: Take 20 mg by mouth daily. ) 90 tablet 1    Blood pressure (!) 121/59, pulse (!) 115, temperature 98.1 F (36.7 C), temperature source Oral, resp. rate 18, SpO2 97 %.  Physical Exam Vitals and nursing note reviewed.  Constitutional:      General: She is not in acute distress.    Appearance: Normal appearance. She is not ill-appearing, toxic-appearing or diaphoretic.  HENT:     Head: Normocephalic and atraumatic.     Nose: Nose normal.     Mouth/Throat:     Lips: Pink.     Mouth: Mucous membranes are moist.     Pharynx: Oropharynx is clear.  Eyes:     General: Lids are normal.     Conjunctiva/sclera: Conjunctivae normal.     Pupils: Pupils are equal, round, and reactive to light.  Neck:     Comments: Pt noted pain in her head with movement of her neck, this is her baseline Cardiovascular:     Rate and Rhythm: Normal rate.     Pulses:          Radial pulses are 2+ on the right side and 2+ on the left side.  Pulmonary:     Effort: Pulmonary  effort is normal. No accessory muscle usage or respiratory distress.  Abdominal:     General: There is no distension.     Tenderness: There is no abdominal tenderness.     Hernia: No hernia is present.  Genitourinary:    Comments: Attempted rectal exam but was covered in stool Musculoskeletal:     Cervical back: Rigidity (likely 2/2 RA) present.  Skin:    General: Skin is warm and dry.     Findings: No rash.  Neurological:     Mental Status: She is alert and oriented to person, place, and time.  Psychiatric:        Mood and Affect: Mood is depressed. Affect is tearful.        Behavior: Behavior normal. Behavior is cooperative.     Results for orders placed or performed during the hospital encounter of 10/22/19 (from the past 48 hour(s))  Type and screen [place]     Status: None (Preliminary result)   Collection Time: 10/22/19  5:03 AM  Result Value Ref Range   ABO/RH(D) O POS    Antibody Screen POS    Sample Expiration 10/25/2019,2359    Antibody Identification NO CLINICALLY SIGNIFICANT ANTIBODY IDENTIFIED    Unit Number [id number]    Blood Component Type RED CELLS,LR    Unit division 00    Status of Unit ALLOCATED    Transfusion Status OK TO TRANSFUSE    Crossmatch Result COMPATIBLE    Unit Number [id number]    Blood Component Type RED CELLS,LR    Unit division 00    Status of Unit ALLOCATED    Transfusion Status OK TO TRANSFUSE    Crossmatch Result COMPATIBLE   ABO/Rh     Status: None   Collection Time: 10/22/19  5:03 AM  Result Value Ref Range   ABO/RH(D) O POS   Comprehensive metabolic panel     Status: Abnormal   Collection Time: 10/22/19  6:00 AM  Result Value Ref Range   Sodium 140 135 - 145 mmol/L   Potassium 4.0 3.5 - 5.1 mmol/L   Chloride 105 98 - 111 mmol/L   CO2 27 22 - 32 mmol/L   Glucose, Bld 99 70 - 99 mg/dL   BUN 31 (H) 8 - 23 mg/dL   Creatinine, Ser 1.22 (H) 0.44 - 1.00 mg/dL   Calcium 9.3 8.9 - 10.3 mg/dL   Total  Protein 5.9 (L) 6.5 - 8.1 g/dL   Albumin 2.4 (L) 3.5 - 5.0 g/dL   AST 19 15 - 41 U/L   ALT 22 0 - 44 U/L   Alkaline Phosphatase 65 38 - 126 U/L   Total Bilirubin 0.9 0.3 - 1.2 mg/dL   GFR calc non Af Amer 40 (L) >60 mL/min   GFR calc Af Amer 46 (L) >60 mL/min   Anion gap 8 5 - 15    Comment: Performed at [facility], [address], [place],  [address]  CBC WITH DIFFERENTIAL     Status: Abnormal   Collection Time: 10/22/19  6:00 AM  Result Value Ref Range   WBC 13.6 (H) 4.0 - 10.5 K/uL   RBC 3.50 (L) 3.87 - 5.11 MIL/uL   Hemoglobin 10.5 (L) 12.0 - 15.0 g/dL   HCT 34.2 (L) 36.0 - 46.0 %   MCV 97.7 80.0 - 100.0 fL   MCH 30.0 26.0 - 34.0 pg   MCHC 30.7 30.0 - 36.0 g/dL   RDW 15.6 (H) 11.5 - 15.5 %   Platelets 392 150 - 400 K/uL   nRBC 0.0 0.0 - 0.2 %   Neutrophils Relative % 77 %   Neutro Abs 10.5 (H) 1.7 - 7.7 K/uL   Lymphocytes Relative 13 %   Lymphs Abs 1.7 0.7 - 4.0 K/uL   Monocytes Relative 9 %   Monocytes Absolute 1.2 (H) 0.1 - 1.0 K/uL  Eosinophils Relative 0 %   Eosinophils Absolute 0.0 0.0 - 0.5 K/uL   Basophils Relative 0 %   Basophils Absolute 0.1 0.0 - 0.1 K/uL   Immature Granulocytes 1 %   Abs Immature Granulocytes 0.07 0.00 - 0.07 K/uL    Comment: Performed at [facility] [address] [place], [place] [address]  Protime-INR     Status: Abnormal   Collection Time: 10/22/19  6:00 AM  Result Value Ref Range   Prothrombin Time 18.0 (H) 11.4 - 15.2 seconds   INR 1.5 (H) 0.8 - 1.2    Comment: (NOTE) INR goal varies based on device and disease states. Performed at [facility], [place] [address] [place], [place] [address]   C-reactive protein     Status: Abnormal   Collection Time: 10/22/19 12:01 PM  Result Value Ref Range   CRP 15.7 (H) <1.0 mg/dL    Comment: Performed at [facility] [address] [place], [place] [address]  D-dimer, quantitative (not at [facility])     Status: Abnormal   Collection Time: 10/22/19 12:01 PM   Result Value Ref Range   D-Dimer, Quant 2.26 (H) 0.00 - 0.50 ug/mL-FEU    Comment: (NOTE) At the manufacturer cut-off of 0.50 ug/mL FEU, this assay has been documented to exclude PE with a sensitivity and negative predictive value of 97 to 99%.  At this time, this assay has not been approved by the FDA to exclude DVT/VTE. Results should be correlated with clinical presentation. Performed at [facility], [place] [address] [place], [place] [address]   Ferritin     Status: None   Collection Time: 10/22/19 12:01 PM  Result Value Ref Range   Ferritin 113 11 - 307 ng/mL    Comment: Performed at [facility], [place] [address], [place], [place] [address]  Fibrinogen     Status: Abnormal   Collection Time: 10/22/19 12:01 PM  Result Value Ref Range   Fibrinogen 631 (H) 210 - 475 mg/dL    Comment: Performed at [facility] [address], [place], [place] [address]  Lactate dehydrogenase     Status: None   Collection Time: 10/22/19 12:01 PM  Result Value Ref Range   LDH 167 98 - 192 U/L    Comment: Performed at [facility], [place] [address] [place], [place] [address]  Procalcitonin     Status: None   Collection Time: 10/22/19 12:01 PM  Result Value Ref Range   Procalcitonin 0.25 ng/mL    Comment:        Interpretation: PCT (Procalcitonin) <= 0.5 ng/mL: Systemic infection (sepsis) is not likely. Local bacterial infection is possible. (NOTE)       Sepsis PCT Algorithm           Lower Respiratory Tract                                      Infection PCT Algorithm    ----------------------------     ----------------------------         PCT < 0.25 ng/mL                PCT < 0.10 ng/mL         Strongly encourage             Strongly discourage   discontinuation of antibiotics  initiation of antibiotics    ----------------------------     -----------------------------       PCT 0.25 - 0.50 ng/mL            PCT 0.10 - 0.25 ng/mL               OR       >80% decrease  in PCT            Discourage initiation of                                            antibiotics      Encourage discontinuation           of antibiotics    ----------------------------     -----------------------------         PCT >= 0.50 ng/mL              PCT 0.26 - 0.50 ng/mL               AND        <80% decrease in PCT             Encourage initiation of                                             antibiotics       Encourage continuation           of antibiotics    ----------------------------     -----------------------------        PCT >= 0.50 ng/mL                  PCT > 0.50 ng/mL               AND         increase in PCT                  Strongly encourage                                      initiation of antibiotics    Strongly encourage escalation           of antibiotics                                     -----------------------------                                           PCT <= 0.25 ng/mL                                                 OR                                        >  80% decrease in PCT                                     Discontinue / Do not initiate                                             antibiotics Performed at [facility], [place] [address], [place], [place] [address]   CBC with Differential/Platelet     Status: Abnormal   Collection Time: 10/23/19  5:00 AM  Result Value Ref Range   WBC 10.6 (H) 4.0 - 10.5 K/uL   RBC 3.16 (L) 3.87 - 5.11 MIL/uL   Hemoglobin 9.2 (L) 12.0 - 15.0 g/dL   HCT 30.4 (L) 36.0 - 46.0 %   MCV 96.2 80.0 - 100.0 fL   MCH 29.1 26.0 - 34.0 pg   MCHC 30.3 30.0 - 36.0 g/dL   RDW 15.4 11.5 - 15.5 %   Platelets 383 150 - 400 K/uL   nRBC 0.0 0.0 - 0.2 %   Neutrophils Relative % 86 %   Neutro Abs 9.0 (H) 1.7 - 7.7 K/uL   Lymphocytes Relative 8 %   Lymphs Abs 0.9 0.7 - 4.0 K/uL   Monocytes Relative 5 %   Monocytes Absolute 0.6 0.1 - 1.0 K/uL   Eosinophils Relative 0 %   Eosinophils Absolute 0.0 0.0 - 0.5 K/uL    Basophils Relative 0 %   Basophils Absolute 0.0 0.0 - 0.1 K/uL   Immature Granulocytes 1 %   Abs Immature Granulocytes 0.07 0.00 - 0.07 K/uL    Comment: Performed at [facility], [place] [address], [place], [place] [address]  Comprehensive metabolic panel     Status: Abnormal   Collection Time: 10/23/19  5:00 AM  Result Value Ref Range   Sodium 141 135 - 145 mmol/L   Potassium 4.4 3.5 - 5.1 mmol/L   Chloride 106 98 - 111 mmol/L   CO2 25 22 - 32 mmol/L   Glucose, Bld 83 70 - 99 mg/dL   BUN 21 8 - 23 mg/dL   Creatinine, Ser 1.03 (H) 0.44 - 1.00 mg/dL   Calcium 8.8 (L) 8.9 - 10.3 mg/dL   Total Protein 5.5 (L) 6.5 - 8.1 g/dL   Albumin 2.5 (L) 3.5 - 5.0 g/dL   AST 21 15 - 41 U/L   ALT 21 0 - 44 U/L   Alkaline Phosphatase 58 38 - 126 U/L   Total Bilirubin 0.9 0.3 - 1.2 mg/dL   GFR calc non Af Amer 49 (L) >60 mL/min   GFR calc Af Amer 57 (L) >60 mL/min   Anion gap 10 5 - 15    Comment: Performed at [facility] [address] [place], [place] [address]  C-reactive protein     Status: Abnormal   Collection Time: 10/23/19  5:00 AM  Result Value Ref Range   CRP 15.5 (H) <1.0 mg/dL    Comment: Performed at [facility], [place] [address] [place], [place] [address]  D-dimer, quantitative (not at [facility])     Status: Abnormal   Collection Time: 10/23/19  5:00 AM  Result Value Ref Range   D-Dimer, Quant 2.66 (H) 0.00 - 0.50 ug/mL-FEU    Comment: (NOTE) At the  manufacturer cut-off of 0.50 ug/mL FEU, this assay has been documented to exclude PE with a sensitivity and negative predictive value of 97 to 99%.  At this time, this assay has not been approved by the FDA to exclude DVT/VTE. Results should be correlated with clinical presentation. Performed at [facility], [place] [address] [place], [place] [address]   Ferritin     Status: None   Collection Time: 10/23/19  5:00 AM  Result Value Ref Range   Ferritin 104 11 - 307 ng/mL    Comment: Performed at [facility], [place] [address], [place], [place] [address]  Magnesium     Status: None   Collection Time: 10/23/19  5:00 AM  Result Value Ref Range   Magnesium 1.9 1.7 - 2.4 mg/dL    Comment: Performed at [facility], [place] [address] [place], [place] [address]  Phosphorus     Status: None   Collection Time: 10/23/19  5:00 AM  Result Value Ref Range   Phosphorus 3.6 2.5 - 4.6 mg/dL    Comment: Performed at [facility] [address] [place], [place] [address]   CT ABDOMEN PELVIS WO CONTRAST  Addendum Date: 10/23/2019   ADDENDUM REPORT: 10/23/2019 10:52 ADDENDUM: Critical Value/emergent results were called by telephone at the time of interpretation on 10/23/2019 at 10:52 am to [place] , who verbally acknowledged these results. Electronically Signed   By: Zetta Bills M.D.   On: 10/23/2019 10:52   Result Date: 10/23/2019 CLINICAL DATA:  History of diverticulosis, GI bleed. EXAM: CT ABDOMEN AND PELVIS WITHOUT CONTRAST TECHNIQUE: Multidetector CT imaging of the abdomen and pelvis was performed following the standard protocol without IV contrast. COMPARISON:  None. FINDINGS: Lower chest: Small bilateral pleural effusions. Heart size is mildly enlarged. No frank pericardial effusion. Minimal basilar atelectasis. Hepatobiliary: Signs of cholelithiasis and calcifications of the gallbladder wall. Normal on contrast appearance of the liver. Pancreas: Unremarkable. No pancreatic ductal dilatation or surrounding inflammatory changes. Spleen: Normal in size without focal abnormality. Adrenals/Urinary Tract: Normal adrenal glands. Cyst arising from upper pole the right kidney. No signs of hydronephrosis, nephro or ureterolithiasis. Cortical thinning bilaterally. Small cyst also arising from the anterior hilar lip of the left kidney. Stomach/Bowel: Marked perirectal stranding at the rectosigmoid junction and high rectum. Irregularity of the posterior rectal wall with stool and gas  extending to the mesial rectum on the right. Signs of diverticular disease in the sigmoid colon. Angulation of bowel and gas tract along the medial wall of the distal sigmoid colon suggests intramural sinus tract in this location. Large amount of stool expands the expected luminal diameter of the colon at the level of the anterior peritoneal reflection. No signs of bowel obstruction or acute bowel process other than above. Moderate-sized hiatal hernia. Vascular/Lymphatic: IVC filter in situ. Calcified atherosclerotic changes of the abdominal aorta. No evidence of adenopathy in the abdomen. No signs of adenopathy in the pelvis. Reproductive: Post hysterectomy. Other: No signs of free air.w Musculoskeletal: No sign of acute bone process or destructive bone finding. Multilevel compression fractures in the spine at the L1, L3 and L4 levels in particular. IMPRESSION: 1. Findings of contained rectal perforation with abundant stool outside of the rectal and colonic lumen. Findings could also be related to repeated diverticulitis though at this location perforation related to diverticulitis would be somewhat unusual. Stercoral colitis and perforation as well as underlying neoplasm are also considered. Surgical consultation is suggested. 2.  Sigmoid diverticular disease with potential intramural tract in the wall of the sigmoid colon, this could potentially explain the above findings. 3. Small bilateral pleural effusions. Cholelithiasis with calcifications of the gallbladder wall. Comparison with prior imaging and/or follow-up with contrasted CT in 6 months as clinically warranted is suggested given the risk of biliary neoplasm in the setting of "porcelain gallbladder". 4. A call is out to the referring provider to further discuss findings and above case. Aortic Atherosclerosis (ICD10-I70.0). Electronically Signed: By: Zetta Bills M.D. On: 10/23/2019 10:40   DG Chest 1 View  Result Date: 10/22/2019 CLINICAL DATA:   Acute respiratory disease secondary to COVID-19 EXAM: CHEST  1 VIEW COMPARISON:  Radiograph 10/20/2019 FINDINGS: Low lung volumes. Cardiac size accentuated by low volumes and portable technique. There is persistent left peripheral and basilar opacities partially obscuring the left hemidiaphragm. Developing right peri and infrahilar opacity is noted as well. No visible pneumothorax or effusion. Degenerative changes are present in the imaged spine and shoulders. IMPRESSION: Persistent left peripheral and basilar opacities with some increasing right perihilar and infrahilar opacity which could reflect infectious consolidation likely with some atelectasis in the setting of low volumes. Electronically Signed   By: Lovena Le M.D.   On: 10/22/2019 21:22   HYBRID OR IMAGING ([facility] ONLY)  Result Date: 10/22/2019 There is no interpretation for this exam.  This order is for images obtained during a surgical procedure.  Please See "Surgeries" Tab for more information regarding the procedure.      Assessment/Plan Principal Problem:   Acute lower GI bleeding Active Problems:   Essential hypertension   Rheumatoid arthritis (HCC)   Chronic kidney disease (CKD) stage G3b   DVT (deep venous thrombosis) (HCC)   COVID-19   Depression  Contained rectal perforation - Seen on CT with stool outside of the rectal and colonic lumen - Patient is nontoxic-appearing, mild tachycardia, no respiratory distress, on room air - Patient denies abdominal pain, WBC 10.6 - Recommend conservative management at this time as patient is a high risk for complications with Warren 2/2 Covid positive diagnosis - Antibiotics, bowel rest, IV fluids - If patient decompensates or worsens we will consider diverting colostomy  FEN: CLD VTE: SCD's, Eliquis on hold, chemical prophylaxis per medicine ID: Zosyn 12/11>> Foley: none Follow up: TBD  Plan: I discussed possible Warren with colostomy and pt became very tearful and does not  think she can handle that. We will follow.   I spoke with her son on the phone.    Kalman Drape, [place] Warren 10/23/2019, 1:59 PM Please see amion for pager for the following: Cristine Polio, & Friday 7:00am - 4:30pm Thursdays 7:00am -11:30am

## 2019-10-23 NOTE — Progress Notes (Signed)
[facility] Progress Note  CC:  GI bleed, rectal perforation  Subjective:  Spoke with the patient's nurse.  No further sign of GI bleeding.  No complaints of abdominal pain.  CT scan shows contained rectal perforation.  Objective:  Vital signs in last 24 hours: Temp:  [97.6 F (36.4 C)-98.3 F (36.8 C)] 98.1 F (36.7 C) (12/11 1052) Pulse Rate:  [71-123] 115 (12/11 1052) Resp:  [12-18] 18 (12/11 1052) BP: (87-121)/(48-78) 121/59 (12/11 1052) SpO2:  [90 %-100 %] 97 % (12/11 1052) Last BM Date: 10/23/19  Not examined due to Covid-10 status.  Intake/Output from previous day: 12/10 0701 - 12/11 0700 In: 253 [I.V.:3; IV Piggyback:250] Out: 400 [Urine:400]  Lab Results: Recent Labs    10/20/19 1257 10/22/19 0600 10/23/19 0500  WBC 13.7* 13.6* 10.6*  HGB 10.8* 10.5* 9.2*  HCT 35.5* 34.2* 30.4*  PLT 399 392 383   BMET Recent Labs    10/20/19 1257 10/22/19 0600 10/23/19 0500  NA 137 140 141  K 4.1 4.0 4.4  CL 98 105 106  CO2 26 27 25   GLUCOSE 115* 99 83  BUN 42* 31* 21  CREATININE 1.38* 1.22* 1.03*  CALCIUM 9.5 9.3 8.8*   LFT Recent Labs    10/23/19 0500  PROT 5.5*  ALBUMIN 2.5*  AST 21  ALT 21  ALKPHOS 58  BILITOT 0.9   PT/INR Recent Labs    10/22/19 0600  LABPROT 18.0*  INR 1.5*   CT ABDOMEN PELVIS WO CONTRAST  Addendum Date: 10/23/2019   ADDENDUM REPORT: 10/23/2019 10:52 ADDENDUM: Critical Value/emergent results were called by telephone at the time of interpretation on 10/23/2019 at 10:52 am to Prue , who verbally acknowledged these results. Electronically Signed   By: Zetta Bills M.D.   On: 10/23/2019 10:52   Result Date: 10/23/2019 CLINICAL DATA:  History of diverticulosis, GI bleed. EXAM: CT ABDOMEN AND PELVIS WITHOUT CONTRAST TECHNIQUE: Multidetector CT imaging of the abdomen and pelvis was performed following the standard protocol without IV contrast. COMPARISON:  None. FINDINGS: Lower chest:  Small bilateral pleural effusions. Heart size is mildly enlarged. No frank pericardial effusion. Minimal basilar atelectasis. Hepatobiliary: Signs of cholelithiasis and calcifications of the gallbladder wall. Normal on contrast appearance of the liver. Pancreas: Unremarkable. No pancreatic ductal dilatation or surrounding inflammatory changes. Spleen: Normal in size without focal abnormality. Adrenals/Urinary Tract: Normal adrenal glands. Cyst arising from upper pole the right kidney. No signs of hydronephrosis, nephro or ureterolithiasis. Cortical thinning bilaterally. Small cyst also arising from the anterior hilar lip of the left kidney. Stomach/Bowel: Marked perirectal stranding at the rectosigmoid junction and high rectum. Irregularity of the posterior rectal wall with stool and gas extending to the mesial rectum on the right. Signs of diverticular disease in the sigmoid colon. Angulation of bowel and gas tract along the medial wall of the distal sigmoid colon suggests intramural sinus tract in this location. Large amount of stool expands the expected luminal diameter of the colon at the level of the anterior peritoneal reflection. No signs of bowel obstruction or acute bowel process other than above. Moderate-sized hiatal hernia. Vascular/Lymphatic: IVC filter in situ. Calcified atherosclerotic changes of the abdominal aorta. No evidence of adenopathy in the abdomen. No signs of adenopathy in the pelvis. Reproductive: Post hysterectomy. Other: No signs of free air.w Musculoskeletal: No sign of acute bone process or destructive bone finding. Multilevel compression fractures in the spine at the L1, L3 and L4 levels in particular. IMPRESSION:  1. Findings of contained rectal perforation with abundant stool outside of the rectal and colonic lumen. Findings could also be related to repeated diverticulitis though at this location perforation related to diverticulitis would be somewhat unusual. Stercoral colitis and  perforation as well as underlying neoplasm are also considered. Surgical consultation is suggested. 2. Sigmoid diverticular disease with potential intramural tract in the wall of the sigmoid colon, this could potentially explain the above findings. 3. Small bilateral pleural effusions. Cholelithiasis with calcifications of the gallbladder wall. Comparison with prior imaging and/or follow-up with contrasted CT in 6 months as clinically warranted is suggested given the risk of biliary neoplasm in the setting of "porcelain gallbladder". 4. A call is out to the referring provider to further discuss findings and above case. Aortic Atherosclerosis (ICD10-I70.0). Electronically Signed: By: Zetta Bills M.D. On: 10/23/2019 10:40   DG Chest 1 View  Result Date: 10/22/2019 CLINICAL DATA:  Acute respiratory disease secondary to COVID-19 EXAM: CHEST  1 VIEW COMPARISON:  Radiograph 10/20/2019 FINDINGS: Low lung volumes. Cardiac size accentuated by low volumes and portable technique. There is persistent left peripheral and basilar opacities partially obscuring the left hemidiaphragm. Developing right peri and infrahilar opacity is noted as well. No visible pneumothorax or effusion. Degenerative changes are present in the imaged spine and shoulders. IMPRESSION: Persistent left peripheral and basilar opacities with some increasing right perihilar and infrahilar opacity which could reflect infectious consolidation likely with some atelectasis in the setting of low volumes. Electronically Signed   By: Lovena Le M.D.   On: 10/22/2019 21:22   HYBRID OR IMAGING ([facility] ONLY)  Result Date: 10/22/2019 There is no interpretation for this exam.  This order is for images obtained during a surgical procedure.  Please See "Surgeries" Tab for more information regarding the procedure.   Assessment / Plan: *Painless hematochezia: The suspicion was diverticular bleed.  She had a CT scan of the abdomen pelvis without contrast this  morning.  Dr. Rush Landmark received a call from the radiologist and the patient actually has a contained rectal perforation.  Dr. Rush Landmark discussed with her son extensively.  Has had issues with constipation recently.  Suspect that she had bleeding and perforation from a stercoral ulcer.  Neoplasm also consideration.  *Extensive R LE DVT, Eliquis started 11/13.  On hold for now.  Had IVC filter placed 12/10.  *Covid 19 + with PNA, on Doxycycline day 4  *CKD:  BUN/creat today improved from recent previous readings  *Rheumatoid arthritis with symptomatic C spine stenosis.   Chronic 10 mg Prednisone and Plaquenil.    *Husband of 71 years died 86 month ago.    -I have consulted pharmacy for IV Zosyn dosing. -I have consulted surgery and they have agreed to see her. -I have updated the attending/hospitalist, Dr. Lonny Prude. -Dr. Rush Landmark already updated her son.    LOS: 1 day   Gloria Warren.   10/23/2019, 11:11 AM

## 2019-10-23 NOTE — Progress Notes (Signed)
Pharmacy Antibiotic Note  Gloria Warren is a 85 y.o. female admitted on 10/22/2019 with GIB.  PTA apixaban reversed.  Pharmacy has been consulted on 12/11 for Zosyn dosing for intra-abdominal infection.  Plan: Zosyn 3.375 g IV q8h EI Monitor clinical status, renal function and culture results daily.   Temp (24hrs), Avg:97.9 F (36.6 C), Min:97.6 F (36.4 C), Max:98.3 F (36.8 C)  Recent Labs  Lab 10/20/19 1257 10/22/19 0600 10/23/19 0500  WBC 13.7* 13.6* 10.6*  CREATININE 1.38* 1.22* 1.03*  LATICACIDVEN 1.2  --   --     Estimated Creatinine Clearance: 31.4 mL/min (A) (by C-G formula based on SCr of 1.03 mg/dL (H)).    Allergies  Allergen Reactions  . Arava [Leflunomide]     Liver & kidney dysfunction  . Sulfonamide Derivatives     Itching & rash  . Remicade [Infliximab] Hives and Itching    Antimicrobials this admission: Zosyn 12/11>> Doxycycline po started 12/8 PTA>>  Dose adjustments this admission:   Microbiology results: 12/8 BCx: ngtd x 3 days  Thank you for allowing pharmacy to be a part of this patient's care. Nicole Cella, RPh Clinical Pharmacist [phone] Please check AMION for all [place] phone numbers After 10:00 PM, call [place] [phone] 10/23/2019 11:32 AM

## 2019-10-23 NOTE — Progress Notes (Signed)
Postop day 1 status post IVC filter placement via left femoral approach.  No issues with her groin in recovery and she had a prolonged recovery in OR 16 given Covid status while awaiting a room last night.  Vascular surgery will sign off.  Please call with questions or concerns or if issues arise with the groin.  I anticipate that this filter will be lifelong given that she is 88 and nonambulatory and has failed anticoagulation given GI bleed in the setting of recent extensive right leg DVT.  Per GI going to hold off on endoscopy for now given her Covid status.  If somehow she has a reversible cause and ends up back on anticoagulation could reevaluate filter retrieval in the future.  Marty Heck, MD [facility] Office: [phone] Pager: [place]

## 2019-10-23 NOTE — Progress Notes (Signed)
Patient transferred from PACU to 5W unit around 8pm. Alert and oriented x4, not in distress, heart rate low 100s. Has a PIV right forearm saline locked. Progressive level of care patient was given a CHG bath and placed on progressive monitor. Skin has generalized bruising; feet are swollen (pt wearing personal compression hose); dry/flaky; otherwise intact. Patient is tearful and grieving over recent loss of her husband. Educated patient about available resources for emotional support and offered my assistance. Patient states that her goal for the evening is to rest. Patient oriented to the unit and routine, will continue to monitor.

## 2019-10-23 NOTE — Progress Notes (Signed)
PROGRESS NOTE    Gloria Warren  [id number] DOB: 1931-07-18 DOA: 10/22/2019 PCP: Binnie Rail, MD   Brief Narrative: Gloria Warren is a 85 y.o. female with medical history significant of RA; Raynaud's; HTN; HLD; Stage 3b-4 CKD; and DVT on Eliquis. Patient presented with bloody stools. COVID positive. IVC placed for PE protection in setting of DVT and GI bleeding. Incidentally, found to have a rectal perforation.   Assessment & Plan:   Principal Problem:   Acute lower GI bleeding Active Problems:   Essential hypertension   Rheumatoid arthritis ([place])   Chronic kidney disease (CKD) stage G3b   DVT (deep venous thrombosis) (HCC)   COVID-19   Depression   Rectal perforation Stool seen outside the lumen. Incidental finding for evaluation of GI bleeding. General surgery consulted by GI and note is pending. Started on [facility] -Await General surgery recommendations  GI bleeding Complicated by Eliquis use for DVT. GI consulted and assessment is likely patient had a stercoral ulcer from chronic constipation that led to bleeding and eventual perforation. -CBC -Watch stools for worsening/continued bleeding  COVID-19 infection Diagnosed prior to admission and currently being treated with doxycycline which was continued on admission. She does not have any hypoxia or dyspnea. Chest x-ray significant for atelectasis. Procalcitonin of 0.25. CRP elevated but also in setting of rectal perforation. -Continue Doxycycline -Daily CMP, CBC, Ferritin, D-dimer, CRP  DVT Previously on Eliquis but presented with GI bleeding. IVC filter placed by Vascular Surgery on 12/10 with recommendation for likely lifelong placement.  Rheumatoid arthritis Started on Solu-Cortef at stress doses. On prednisone and Plaquenil as an outpatient -Continue Plaquenil -Discontinue Solu-Cortef and start Prednisone 7.5 mg (home dose). Hold stress doses as BP is in good range and in setting of  perforation  Depression Per chart history, patient with emotional lability.  CKD stage 3b Stable   DVT prophylaxis: SCDs Code Status:   Code Status: Full Code Family Communication: None Disposition Plan: Discharge pending continued management of medical/surgical needs, including perforated rectum   Consultants:   Vascular surgery  [facility]  General surgery  Procedures:   IVC filter placement  Antimicrobials:  Doxycycline  Zosyn (12/11>>    Subjective: Patient reports no issues currently.  Objective: Vitals:   10/23/19 0436 10/23/19 0457 10/23/19 0904 10/23/19 1052  BP: 108/61  104/66 (!) 121/59  Pulse: (!) 112 100 (!) 116 (!) 115  Resp: 16  18 18   Temp:   98.3 F (36.8 C) 98.1 F (36.7 C)  TempSrc:    Oral  SpO2: 95%  95% 97%    Intake/Output Summary (Last 24 hours) at 10/23/2019 1420 Last data filed at 10/23/2019 1312 Gross per 24 hour  Intake 373 ml  Output 1600 ml  Net -1227 ml   There were no vitals filed for this visit.  Examination:  General exam: Appears calm and comfortable Respiratory system: Clear to auscultation. Respiratory effort normal. Cardiovascular system: S1 & S2 heard, RRR. No murmurs, rubs, gallops or clicks. Gastrointestinal system: Abdomen is nondistended, soft and nontender. No organomegaly or masses felt. Normal bowel sounds heard. Central nervous system: Alert and oriented. No focal neurological deficits. Extremities: No edema. No calf tenderness Skin: No cyanosis. No rashes Psychiatry: Judgement and insight appear normal. Mood & affect appropriate.     Data Reviewed: I have personally reviewed following labs and imaging studies  CBC: Recent Labs  Lab 10/20/19 1257 10/22/19 0600 10/23/19 0500  WBC 13.7* 13.6* 10.6*  NEUTROABS  12.3* 10.5* 9.0*  HGB 10.8* 10.5* 9.2*  HCT 35.5* 34.2* 30.4*  MCV 97.5 97.7 96.2  PLT 399 392 [id number]   Basic Metabolic Panel: Recent Labs  Lab 10/20/19 1257 10/22/19 0600  10/23/19 0500  NA 137 140 141  K 4.1 4.0 4.4  CL 98 105 106  CO2 26 27 25   GLUCOSE 115* 99 83  BUN 42* 31* 21  CREATININE 1.38* 1.22* 1.03*  CALCIUM 9.5 9.3 8.8*  MG  --   --  1.9  PHOS  --   --  3.6   GFR: Estimated Creatinine Clearance: 31.4 mL/min (A) (by C-G formula based on SCr of 1.03 mg/dL (H)). Liver Function Tests: Recent Labs  Lab 10/20/19 1257 10/22/19 0600 10/23/19 0500  AST 23 19 21   ALT 22 22 21   ALKPHOS 78 65 58  BILITOT 0.2* 0.9 0.9  PROT 6.3* 5.9* 5.5*  ALBUMIN 2.7* 2.4* 2.5*   No results for input(s): LIPASE, AMYLASE in the last 168 hours. No results for input(s): AMMONIA in the last 168 hours. Coagulation Profile: Recent Labs  Lab 10/22/19 0600  INR 1.5*   Cardiac Enzymes: No results for input(s): CKTOTAL, CKMB, CKMBINDEX, TROPONINI in the last 168 hours. BNP (last 3 results) No results for input(s): PROBNP in the last 8760 hours. HbA1C: No results for input(s): HGBA1C in the last 72 hours. CBG: No results for input(s): GLUCAP in the last 168 hours. Lipid Profile: No results for input(s): CHOL, HDL, LDLCALC, TRIG, CHOLHDL, LDLDIRECT in the last 72 hours. Thyroid Function Tests: No results for input(s): TSH, T4TOTAL, FREET4, T3FREE, THYROIDAB in the last 72 hours. Anemia Panel: Recent Labs    10/22/19 1201 10/23/19 0500  FERRITIN 113 104   Sepsis Labs: Recent Labs  Lab 10/20/19 1257 10/22/19 1201  PROCALCITON 0.23 0.25  LATICACIDVEN 1.2  --     Recent Results (from the past 240 hour(s))  Blood Culture (routine x 2)     Status: None (Preliminary result)   Collection Time: 10/20/19  1:14 PM   Specimen: BLOOD  Result Value Ref Range Status   Specimen Description BLOOD LEFT ANTECUBITAL  Final   Special Requests   Final    BOTTLES DRAWN AEROBIC AND ANAEROBIC Blood Culture adequate volume   Culture   Final    NO GROWTH 3 DAYS Performed at [facility], [place] [address] [place], [place] [address]    Report Status PENDING   Incomplete         Radiology Studies: CT ABDOMEN PELVIS WO CONTRAST  Addendum Date: 10/23/2019   ADDENDUM REPORT: 10/23/2019 10:52 ADDENDUM: Critical Value/emergent results were called by telephone at the time of interpretation on 10/23/2019 at 10:52 am to [place] , who verbally acknowledged these results. Electronically Signed   By: Zetta Bills M.D.   On: 10/23/2019 10:52   Result Date: 10/23/2019 CLINICAL DATA:  History of diverticulosis, GI bleed. EXAM: CT ABDOMEN AND PELVIS WITHOUT CONTRAST TECHNIQUE: Multidetector CT imaging of the abdomen and pelvis was performed following the standard protocol without IV contrast. COMPARISON:  None. FINDINGS: Lower chest: Small bilateral pleural effusions. Heart size is mildly enlarged. No frank pericardial effusion. Minimal basilar atelectasis. Hepatobiliary: Signs of cholelithiasis and calcifications of the gallbladder wall. Normal on contrast appearance of the liver. Pancreas: Unremarkable. No pancreatic ductal dilatation or surrounding inflammatory changes. Spleen: Normal in size without focal abnormality. Adrenals/Urinary Tract: Normal adrenal glands. Cyst arising from upper pole the right kidney. No signs of hydronephrosis,  nephro or ureterolithiasis. Cortical thinning bilaterally. Small cyst also arising from the anterior hilar lip of the left kidney. Stomach/Bowel: Marked perirectal stranding at the rectosigmoid junction and high rectum. Irregularity of the posterior rectal wall with stool and gas extending to the mesial rectum on the right. Signs of diverticular disease in the sigmoid colon. Angulation of bowel and gas tract along the medial wall of the distal sigmoid colon suggests intramural sinus tract in this location. Large amount of stool expands the expected luminal diameter of the colon at the level of the anterior peritoneal reflection. No signs of bowel obstruction or acute bowel process other than above.  Moderate-sized hiatal hernia. Vascular/Lymphatic: IVC filter in situ. Calcified atherosclerotic changes of the abdominal aorta. No evidence of adenopathy in the abdomen. No signs of adenopathy in the pelvis. Reproductive: Post hysterectomy. Other: No signs of free air.w Musculoskeletal: No sign of acute bone process or destructive bone finding. Multilevel compression fractures in the spine at the L1, L3 and L4 levels in particular. IMPRESSION: 1. Findings of contained rectal perforation with abundant stool outside of the rectal and colonic lumen. Findings could also be related to repeated diverticulitis though at this location perforation related to diverticulitis would be somewhat unusual. Stercoral colitis and perforation as well as underlying neoplasm are also considered. Surgical consultation is suggested. 2. Sigmoid diverticular disease with potential intramural tract in the wall of the sigmoid colon, this could potentially explain the above findings. 3. Small bilateral pleural effusions. Cholelithiasis with calcifications of the gallbladder wall. Comparison with prior imaging and/or follow-up with contrasted CT in 6 months as clinically warranted is suggested given the risk of biliary neoplasm in the setting of "porcelain gallbladder". 4. A call is out to the referring provider to further discuss findings and above case. Aortic Atherosclerosis (ICD10-I70.0). Electronically Signed: By: Zetta Bills M.D. On: 10/23/2019 10:40   DG Chest 1 View  Result Date: 10/22/2019 CLINICAL DATA:  Acute respiratory disease secondary to COVID-19 EXAM: CHEST  1 VIEW COMPARISON:  Radiograph 10/20/2019 FINDINGS: Low lung volumes. Cardiac size accentuated by low volumes and portable technique. There is persistent left peripheral and basilar opacities partially obscuring the left hemidiaphragm. Developing right peri and infrahilar opacity is noted as well. No visible pneumothorax or effusion. Degenerative changes are present  in the imaged spine and shoulders. IMPRESSION: Persistent left peripheral and basilar opacities with some increasing right perihilar and infrahilar opacity which could reflect infectious consolidation likely with some atelectasis in the setting of low volumes. Electronically Signed   By: Lovena Le M.D.   On: 10/22/2019 21:22   HYBRID OR IMAGING ([facility] ONLY)  Result Date: 10/22/2019 There is no interpretation for this exam.  This order is for images obtained during a surgical procedure.  Please See "Surgeries" Tab for more information regarding the procedure.        Scheduled Meds:  doxycycline  100 mg Oral BID   DULoxetine  30 mg Oral Daily   escitalopram  5 mg Oral Daily   hydrocortisone sod succinate (SOLU-CORTEF) inj  50 mg Intravenous Q6H   hydroxychloroquine  200 mg Oral BID   loratadine  10 mg Oral Daily   sodium chloride flush  3 mL Intravenous Q12H   Continuous Infusions:  sodium chloride     piperacillin-tazobactam (ZOSYN)  IV 3.375 g (10/23/19 1311)     LOS: 1 day     Cordelia Poche, MD [facility] 10/23/2019, 2:20 PM  If 7PM-7AM, please contact night-coverage [email or web address]

## 2019-10-23 NOTE — Progress Notes (Signed)
   Vital Signs MEWS/VS Documentation      10/23/2019 0457 10/23/2019 0700 10/23/2019 0904 10/23/2019 1052   MEWS Score:  0  1  2  2    MEWS Score Color:  Green  Green  Yellow  Yellow   Resp:  --  --  18  18   Pulse:  100  --  (!) 116  (!) 115   BP:  --  --  104/66  (!) 121/59   Temp:  --  --  98.3 F (36.8 C)  98.1 F (36.7 C)   O2 Device:  --  --  Room Air  Room Air   Level of Consciousness:  --  --  Alert  Alert     Patient returned to the room after CT of the abdomen, no complaints of pain or change in LOC, MEWS score of 2 no acute changes at this time will continue to monitor.       Gloria Warren 10/23/2019,10:56 AM

## 2019-10-24 LAB — COMPREHENSIVE METABOLIC PANEL
ALT: 20 U/L (ref 0–44)
AST: 23 U/L (ref 15–41)
Albumin: 2.4 g/dL — ABNORMAL LOW (ref 3.5–5.0)
Alkaline Phosphatase: 54 U/L (ref 38–126)
Anion gap: 8 (ref 5–15)
BUN: 19 mg/dL (ref 8–23)
CO2: 27 mmol/L (ref 22–32)
Calcium: 8.6 mg/dL — ABNORMAL LOW (ref 8.9–10.3)
Chloride: 106 mmol/L (ref 98–111)
Creatinine, Ser: 1.02 mg/dL — ABNORMAL HIGH (ref 0.44–1.00)
GFR calc Af Amer: 58 mL/min — ABNORMAL LOW (ref >60)
GFR calc non Af Amer: 50 mL/min — ABNORMAL LOW (ref >60)
Glucose, Bld: 91 mg/dL (ref 70–99)
Potassium: 3.5 mmol/L (ref 3.5–5.1)
Sodium: 141 mmol/L (ref 135–145)
Total Bilirubin: 1 mg/dL (ref 0.3–1.2)
Total Protein: 5.4 g/dL — ABNORMAL LOW (ref 6.5–8.1)

## 2019-10-24 LAB — CBC WITH DIFFERENTIAL/PLATELET
Abs Immature Granulocytes: 0.07 10*3/uL (ref 0.00–0.07)
Basophils Absolute: 0 10*3/uL (ref 0.0–0.1)
Basophils Relative: 0 %
Eosinophils Absolute: 0 10*3/uL (ref 0.0–0.5)
Eosinophils Relative: 0 %
HCT: 30.9 % — ABNORMAL LOW (ref 36.0–46.0)
Hemoglobin: 9.5 g/dL — ABNORMAL LOW (ref 12.0–15.0)
Immature Granulocytes: 1 %
Lymphocytes Relative: 17 %
Lymphs Abs: 2.1 10*3/uL (ref 0.7–4.0)
MCH: 29.1 pg (ref 26.0–34.0)
MCHC: 30.7 g/dL (ref 30.0–36.0)
MCV: 94.8 fL (ref 80.0–100.0)
Monocytes Absolute: 0.9 10*3/uL (ref 0.1–1.0)
Monocytes Relative: 7 %
Neutro Abs: 9.2 10*3/uL — ABNORMAL HIGH (ref 1.7–7.7)
Neutrophils Relative %: 75 %
Platelets: 420 10*3/uL — ABNORMAL HIGH (ref 150–400)
RBC: 3.26 MIL/uL — ABNORMAL LOW (ref 3.87–5.11)
RDW: 15 % (ref 11.5–15.5)
WBC: 12.3 10*3/uL — ABNORMAL HIGH (ref 4.0–10.5)
nRBC: 0 % (ref 0.0–0.2)

## 2019-10-24 LAB — D-DIMER, QUANTITATIVE: D-Dimer, Quant: 2.01 ug/mL-FEU — ABNORMAL HIGH (ref 0.00–0.50)

## 2019-10-24 LAB — MAGNESIUM: Magnesium: 1.9 mg/dL (ref 1.7–2.4)

## 2019-10-24 LAB — C-REACTIVE PROTEIN: CRP: 8 mg/dL — ABNORMAL HIGH (ref <1.0)

## 2019-10-24 LAB — PHOSPHORUS: Phosphorus: 2.2 mg/dL — ABNORMAL LOW (ref 2.5–4.6)

## 2019-10-24 LAB — FERRITIN: Ferritin: 102 ng/mL (ref 11–307)

## 2019-10-24 NOTE — Progress Notes (Signed)
Spoke with Pt's son via phone, gave updates to the best of my knowledge. Answered all questions/concerns. Son would like an update from MD. Will notify if any changes.

## 2019-10-24 NOTE — Progress Notes (Signed)
2 Days Post-Op   Subjective/Chief Complaint: No complaints, had a large bm   Objective: Vital signs in last 24 hours: Temp:  [98.1 F (36.7 C)-98.3 F (36.8 C)] 98.1 F (36.7 C) (12/12 0600) Pulse Rate:  [73-114] 88 (12/12 0800) Resp:  [14-21] 17 (12/12 0800) BP: (90-121)/(51-69) 114/52 (12/12 0800) SpO2:  [96 %-100 %] 96 % (12/12 0800) Last BM Date: 10/23/19  Intake/Output from previous day: 12/11 0701 - 12/12 0700 In: 580 [P.O.:480; IV Piggyback:100] Out: 1200 [Urine:1200] Intake/Output this shift: No intake/output data recorded.  abd nontender nondistended  Lab Results:  Recent Labs    10/23/19 0500 10/24/19 0500  WBC 10.6* 12.3*  HGB 9.2* 9.5*  HCT 30.4* 30.9*  PLT 383 420*   BMET Recent Labs    10/23/19 0500 10/24/19 0500  NA 141 141  K 4.4 3.5  CL 106 106  CO2 25 27  GLUCOSE 83 91  BUN 21 19  CREATININE 1.03* 1.02*  CALCIUM 8.8* 8.6*   PT/INR Recent Labs    10/22/19 0600  LABPROT 18.0*  INR 1.5*   ABG No results for input(s): PHART, HCO3 in the last 72 hours.  Invalid input(s): PCO2, PO2  Studies/Results: CT ABDOMEN PELVIS WO CONTRAST  Addendum Date: 10/23/2019   ADDENDUM REPORT: 10/23/2019 10:52 ADDENDUM: Critical Value/emergent results were called by telephone at the time of interpretation on 10/23/2019 at 10:52 am to [place] , who verbally acknowledged these results. Electronically Signed   By: Zetta Bills M.D.   On: 10/23/2019 10:52   Result Date: 10/23/2019 CLINICAL DATA:  History of diverticulosis, GI bleed. EXAM: CT ABDOMEN AND PELVIS WITHOUT CONTRAST TECHNIQUE: Multidetector CT imaging of the abdomen and pelvis was performed following the standard protocol without IV contrast. COMPARISON:  None. FINDINGS: Lower chest: Small bilateral pleural effusions. Heart size is mildly enlarged. No frank pericardial effusion. Minimal basilar atelectasis. Hepatobiliary: Signs of cholelithiasis and calcifications of the  gallbladder wall. Normal on contrast appearance of the liver. Pancreas: Unremarkable. No pancreatic ductal dilatation or surrounding inflammatory changes. Spleen: Normal in size without focal abnormality. Adrenals/Urinary Tract: Normal adrenal glands. Cyst arising from upper pole the right kidney. No signs of hydronephrosis, nephro or ureterolithiasis. Cortical thinning bilaterally. Small cyst also arising from the anterior hilar lip of the left kidney. Stomach/Bowel: Marked perirectal stranding at the rectosigmoid junction and high rectum. Irregularity of the posterior rectal wall with stool and gas extending to the mesial rectum on the right. Signs of diverticular disease in the sigmoid colon. Angulation of bowel and gas tract along the medial wall of the distal sigmoid colon suggests intramural sinus tract in this location. Large amount of stool expands the expected luminal diameter of the colon at the level of the anterior peritoneal reflection. No signs of bowel obstruction or acute bowel process other than above. Moderate-sized hiatal hernia. Vascular/Lymphatic: IVC filter in situ. Calcified atherosclerotic changes of the abdominal aorta. No evidence of adenopathy in the abdomen. No signs of adenopathy in the pelvis. Reproductive: Post hysterectomy. Other: No signs of free air.w Musculoskeletal: No sign of acute bone process or destructive bone finding. Multilevel compression fractures in the spine at the L1, L3 and L4 levels in particular. IMPRESSION: 1. Findings of contained rectal perforation with abundant stool outside of the rectal and colonic lumen. Findings could also be related to repeated diverticulitis though at this location perforation related to diverticulitis would be somewhat unusual. Stercoral colitis and perforation as well as underlying neoplasm are also considered. Surgical  consultation is suggested. 2. Sigmoid diverticular disease with potential intramural tract in the wall of the sigmoid  colon, this could potentially explain the above findings. 3. Small bilateral pleural effusions. Cholelithiasis with calcifications of the gallbladder wall. Comparison with prior imaging and/or follow-up with contrasted CT in 6 months as clinically warranted is suggested given the risk of biliary neoplasm in the setting of "porcelain gallbladder". 4. A call is out to the referring provider to further discuss findings and above case. Aortic Atherosclerosis (ICD10-I70.0). Electronically Signed: By: Zetta Bills M.D. On: 10/23/2019 10:40   DG Chest 1 View  Result Date: 10/22/2019 CLINICAL DATA:  Acute respiratory disease secondary to COVID-19 EXAM: CHEST  1 VIEW COMPARISON:  Radiograph 10/20/2019 FINDINGS: Low lung volumes. Cardiac size accentuated by low volumes and portable technique. There is persistent left peripheral and basilar opacities partially obscuring the left hemidiaphragm. Developing right peri and infrahilar opacity is noted as well. No visible pneumothorax or effusion. Degenerative changes are present in the imaged spine and shoulders. IMPRESSION: Persistent left peripheral and basilar opacities with some increasing right perihilar and infrahilar opacity which could reflect infectious consolidation likely with some atelectasis in the setting of low volumes. Electronically Signed   By: Lovena Le M.D.   On: 10/22/2019 21:22   HYBRID OR IMAGING ([facility] ONLY)  Result Date: 10/22/2019 There is no interpretation for this exam.  This order is for images obtained during a surgical procedure.  Please See "Surgeries" Tab for more information regarding the procedure.    Anti-infectives: Anti-infectives (From admission, onward)   Start     Dose/Rate Route Frequency Ordered Stop   10/23/19 1215  piperacillin-tazobactam (ZOSYN) IVPB 3.375 g     3.375 g 12.5 mL/hr over 240 Minutes Intravenous Every 8 hours 10/23/19 1132     10/22/19 1100  hydroxychloroquine (PLAQUENIL) tablet 200 mg     200 mg Oral  2 times daily 10/22/19 0957     10/22/19 1000  doxycycline (VIBRA-TABS) tablet 100 mg     100 mg Oral 2 times daily 10/22/19 0957 10/27/19 0959      Assessment/Plan:  Possible contained rectal perforation - I agree with Dr Vonna Kotyk assessment of CT scan. She is on pred and plaquenil for her RA but I dont think this is perforation. Only way to make sure would be ct with rectal contrast or enema which might be reasonable.  I do not think there is any surgical indication at this time and any surgery unless she decompensated would be after further evaluation. I doubt she has gi bleed and perforation at same time -I advanced her diet to fulls today Covid- per medical service FEN: fulls VTE: SCD's, Eliquis on hold, chemical prophylaxis per medicine, has ivc filter ID: Zosyn 12/11>> Foley: none Follow up: TBD   Rolm Bookbinder 10/24/2019

## 2019-10-24 NOTE — Plan of Care (Signed)
  Problem: Education: Goal: Knowledge of General Education information will improve Description: Including pain rating scale, medication(s)/side effects and non-pharmacologic comfort measures Outcome: Progressing   Problem: Clinical Measurements: Goal: Ability to maintain clinical measurements within normal limits will improve Outcome: Progressing Goal: Will remain free from infection Outcome: Progressing Goal: Respiratory complications will improve Outcome: Progressing   Problem: Elimination: Goal: Will not experience complications related to urinary retention Outcome: Progressing   Problem: Pain Managment: Goal: General experience of comfort will improve Outcome: Progressing   Problem: Education: Goal: Knowledge of risk factors and measures for prevention of condition will improve Outcome: Progressing

## 2019-10-24 NOTE — Progress Notes (Signed)
PROGRESS NOTE    Gloria Warren  [id number] DOB: 02-24-1931 DOA: 10/22/2019 PCP: Binnie Rail, MD   Brief Narrative: Gloria Warren is a 85 y.o. female with medical history significant of RA; Raynaud's; HTN; HLD; Stage 3b-4 CKD; and DVT on Eliquis. Patient presented with bloody stools. COVID positive. IVC placed for PE protection in setting of DVT and GI bleeding. Incidentally, found to have a rectal perforation.   Assessment & Plan:   Principal Problem:   Acute lower GI bleeding Active Problems:   Essential hypertension   Rheumatoid arthritis ([place])   Chronic kidney disease (CKD) stage G3b   DVT (deep venous thrombosis) (HCC)   COVID-19   Depression   Possible rectal perforation Stool seen outside the lumen although general surgery assessment suggests this could be overread in setting of very tortuous sigmoid colon. Incidental finding for evaluation of GI bleeding. General surgery consulted by GI and note is pending. Started on Zosyn IV. Mild increase in leukocytosis. -General surgery recommendations: Zosyn IV  GI bleeding Complicated by Eliquis use for DVT. GI consulted and assessment is likely patient had a stercoral ulcer from chronic constipation that led to bleeding and eventual perforation. Hemoglobin stable. -CBC -Watch stools for worsening/continued bleeding  COVID-19 infection Diagnosed prior to admission and currently being treated with doxycycline which was continued on admission. She does not have any hypoxia or dyspnea. Chest x-ray significant for atelectasis. Procalcitonin of 0.25. CRP elevated but also in setting of rectal perforation. -Continue Doxycycline -Daily CMP, CBC, Ferritin, D-dimer, CRP  DVT Previously on Eliquis but presented with GI bleeding. IVC filter placed by Vascular Surgery on 12/10 with recommendation for likely lifelong placement.  Rheumatoid arthritis Started on Solu-Cortef as stress doses. On prednisone and Plaquenil as an  outpatient. Discontinued Solu-Cortef. -Continue Plaquenil -Continue Prednisone 7.5 mg (home dose)  Depression Per chart history, patient with emotional lability.  CKD stage 3b Stable   DVT prophylaxis: SCDs Code Status:   Code Status: Full Code Family Communication: Son on telephone Disposition Plan: Discharge pending continued management of medical/surgical needs, including perforated rectum   Consultants:   Vascular surgery  [facility]  General surgery  Procedures:   IVC filter placement  Antimicrobials:  Doxycycline  Zosyn (12/11>>    Subjective: No dyspnea, chest pain, abdominal pain. States her stool was dark but not black.  Objective: Vitals:   10/24/19 0350 10/24/19 0400 10/24/19 0600 10/24/19 0800  BP: 121/68 110/60 (!) 110/54 (!) 114/52  Pulse: (!) 108 74 73 88  Resp: (!) 21 15 14 17   Temp: 98.3 F (36.8 C)  98.1 F (36.7 C)   TempSrc: Oral  Oral   SpO2: 98% 97% 97% 96%    Intake/Output Summary (Last 24 hours) at 10/24/2019 1026 Last data filed at 10/24/2019 0600 Gross per 24 hour  Intake 460 ml  Output 1200 ml  Net -740 ml   There were no vitals filed for this visit.  Examination:  General exam: Appears calm and comfortable Respiratory system: Clear to auscultation. Respiratory effort normal. Cardiovascular system: S1 & S2 heard, RRR. No murmurs, rubs, gallops or clicks. Gastrointestinal system: Abdomen is nondistended, soft and nontender. No organomegaly or masses felt. Normal bowel sounds heard. Central nervous system: Alert and oriented. No focal neurological deficits. Extremities: No edema. No calf tenderness Skin: No cyanosis. No rashes Psychiatry: Judgement and insight appear normal. Mood & affect appropriate.     Data Reviewed: I have personally reviewed following labs and imaging studies  CBC: Recent Labs  Lab 10/20/19 1257 10/22/19 0600 10/23/19 0500 10/24/19 0500  WBC 13.7* 13.6* 10.6* 12.3*  NEUTROABS 12.3* 10.5*  9.0* 9.2*  HGB 10.8* 10.5* 9.2* 9.5*  HCT 35.5* 34.2* 30.4* 30.9*  MCV 97.5 97.7 96.2 94.8  PLT 399 392 383 0000000*   Basic Metabolic Panel: Recent Labs  Lab 10/20/19 1257 10/22/19 0600 10/23/19 0500 10/24/19 0500  NA 137 140 141 141  K 4.1 4.0 4.4 3.5  CL 98 105 106 106  CO2 26 27 25 27   GLUCOSE 115* 99 83 91  BUN 42* 31* 21 19  CREATININE 1.38* 1.22* 1.03* 1.02*  CALCIUM 9.5 9.3 8.8* 8.6*  MG  --   --  1.9 1.9  PHOS  --   --  3.6 2.2*   GFR: Estimated Creatinine Clearance: 31.7 mL/min (A) (by C-G formula based on SCr of 1.02 mg/dL (H)). Liver Function Tests: Recent Labs  Lab 10/20/19 1257 10/22/19 0600 10/23/19 0500 10/24/19 0500  AST 23 19 21 23   ALT 22 22 21 20   ALKPHOS 78 65 58 54  BILITOT 0.2* 0.9 0.9 1.0  PROT 6.3* 5.9* 5.5* 5.4*  ALBUMIN 2.7* 2.4* 2.5* 2.4*   No results for input(s): LIPASE, AMYLASE in the last 168 hours. No results for input(s): AMMONIA in the last 168 hours. Coagulation Profile: Recent Labs  Lab 10/22/19 0600  INR 1.5*   Cardiac Enzymes: No results for input(s): CKTOTAL, CKMB, CKMBINDEX, TROPONINI in the last 168 hours. BNP (last 3 results) No results for input(s): PROBNP in the last 8760 hours. HbA1C: No results for input(s): HGBA1C in the last 72 hours. CBG: No results for input(s): GLUCAP in the last 168 hours. Lipid Profile: No results for input(s): CHOL, HDL, LDLCALC, TRIG, CHOLHDL, LDLDIRECT in the last 72 hours. Thyroid Function Tests: No results for input(s): TSH, T4TOTAL, FREET4, T3FREE, THYROIDAB in the last 72 hours. Anemia Panel: Recent Labs    10/23/19 0500 10/24/19 0500  FERRITIN 104 102   Sepsis Labs: Recent Labs  Lab 10/20/19 1257 10/22/19 1201  PROCALCITON 0.23 0.25  LATICACIDVEN 1.2  --     Recent Results (from the past 240 hour(s))  Blood Culture (routine x 2)     Status: None (Preliminary result)   Collection Time: 10/20/19  1:14 PM   Specimen: BLOOD  Result Value Ref Range Status   Specimen  Description BLOOD LEFT ANTECUBITAL  Final   Special Requests   Final    BOTTLES DRAWN AEROBIC AND ANAEROBIC Blood Culture adequate volume   Culture   Final    NO GROWTH 4 DAYS Performed at [facility], [place] [address] [place], [place] [address]    Report Status PENDING  Incomplete         Radiology Studies: CT ABDOMEN PELVIS WO CONTRAST  Addendum Date: 10/23/2019   ADDENDUM REPORT: 10/23/2019 10:52 ADDENDUM: Critical Value/emergent results were called by telephone at the time of interpretation on 10/23/2019 at 10:52 am to [place] , who verbally acknowledged these results. Electronically Signed   By: Zetta Bills M.D.   On: 10/23/2019 10:52   Result Date: 10/23/2019 CLINICAL DATA:  History of diverticulosis, GI bleed. EXAM: CT ABDOMEN AND PELVIS WITHOUT CONTRAST TECHNIQUE: Multidetector CT imaging of the abdomen and pelvis was performed following the standard protocol without IV contrast. COMPARISON:  None. FINDINGS: Lower chest: Small bilateral pleural effusions. Heart size is mildly enlarged. No frank pericardial effusion. Minimal basilar atelectasis. Hepatobiliary: Signs of cholelithiasis and  calcifications of the gallbladder wall. Normal on contrast appearance of the liver. Pancreas: Unremarkable. No pancreatic ductal dilatation or surrounding inflammatory changes. Spleen: Normal in size without focal abnormality. Adrenals/Urinary Tract: Normal adrenal glands. Cyst arising from upper pole the right kidney. No signs of hydronephrosis, nephro or ureterolithiasis. Cortical thinning bilaterally. Small cyst also arising from the anterior hilar lip of the left kidney. Stomach/Bowel: Marked perirectal stranding at the rectosigmoid junction and high rectum. Irregularity of the posterior rectal wall with stool and gas extending to the mesial rectum on the right. Signs of diverticular disease in the sigmoid colon. Angulation of bowel and gas tract along the medial wall of  the distal sigmoid colon suggests intramural sinus tract in this location. Large amount of stool expands the expected luminal diameter of the colon at the level of the anterior peritoneal reflection. No signs of bowel obstruction or acute bowel process other than above. Moderate-sized hiatal hernia. Vascular/Lymphatic: IVC filter in situ. Calcified atherosclerotic changes of the abdominal aorta. No evidence of adenopathy in the abdomen. No signs of adenopathy in the pelvis. Reproductive: Post hysterectomy. Other: No signs of free air.w Musculoskeletal: No sign of acute bone process or destructive bone finding. Multilevel compression fractures in the spine at the L1, L3 and L4 levels in particular. IMPRESSION: 1. Findings of contained rectal perforation with abundant stool outside of the rectal and colonic lumen. Findings could also be related to repeated diverticulitis though at this location perforation related to diverticulitis would be somewhat unusual. Stercoral colitis and perforation as well as underlying neoplasm are also considered. Surgical consultation is suggested. 2. Sigmoid diverticular disease with potential intramural tract in the wall of the sigmoid colon, this could potentially explain the above findings. 3. Small bilateral pleural effusions. Cholelithiasis with calcifications of the gallbladder wall. Comparison with prior imaging and/or follow-up with contrasted CT in 6 months as clinically warranted is suggested given the risk of biliary neoplasm in the setting of "porcelain gallbladder". 4. A call is out to the referring provider to further discuss findings and above case. Aortic Atherosclerosis (ICD10-I70.0). Electronically Signed: By: Zetta Bills M.D. On: 10/23/2019 10:40   DG Chest 1 View  Result Date: 10/22/2019 CLINICAL DATA:  Acute respiratory disease secondary to COVID-19 EXAM: CHEST  1 VIEW COMPARISON:  Radiograph 10/20/2019 FINDINGS: Low lung volumes. Cardiac size accentuated by  low volumes and portable technique. There is persistent left peripheral and basilar opacities partially obscuring the left hemidiaphragm. Developing right peri and infrahilar opacity is noted as well. No visible pneumothorax or effusion. Degenerative changes are present in the imaged spine and shoulders. IMPRESSION: Persistent left peripheral and basilar opacities with some increasing right perihilar and infrahilar opacity which could reflect infectious consolidation likely with some atelectasis in the setting of low volumes. Electronically Signed   By: Lovena Le M.D.   On: 10/22/2019 21:22   HYBRID OR IMAGING ([facility] ONLY)  Result Date: 10/22/2019 There is no interpretation for this exam.  This order is for images obtained during a surgical procedure.  Please See "Surgeries" Tab for more information regarding the procedure.        Scheduled Meds: . doxycycline  100 mg Oral BID  . DULoxetine  30 mg Oral Daily  . escitalopram  5 mg Oral Daily  . hydroxychloroquine  200 mg Oral BID  . loratadine  10 mg Oral Daily  . predniSONE  7.5 mg Oral Q breakfast  . sodium chloride flush  3 mL Intravenous Q12H   Continuous  Infusions: . sodium chloride    . piperacillin-tazobactam (ZOSYN)  IV 3.375 g (10/24/19 0539)     LOS: 2 days     Cordelia Poche, MD [facility] 10/24/2019, 10:26 AM  If 7PM-7AM, please contact night-coverage [email or web address]

## 2019-10-25 LAB — COMPREHENSIVE METABOLIC PANEL
ALT: 22 U/L (ref 0–44)
AST: 22 U/L (ref 15–41)
Albumin: 2.3 g/dL — ABNORMAL LOW (ref 3.5–5.0)
Alkaline Phosphatase: 52 U/L (ref 38–126)
Anion gap: 7 (ref 5–15)
BUN: 17 mg/dL (ref 8–23)
CO2: 26 mmol/L (ref 22–32)
Calcium: 8.3 mg/dL — ABNORMAL LOW (ref 8.9–10.3)
Chloride: 109 mmol/L (ref 98–111)
Creatinine, Ser: 1.16 mg/dL — ABNORMAL HIGH (ref 0.44–1.00)
GFR calc Af Amer: 49 mL/min — ABNORMAL LOW (ref >60)
GFR calc non Af Amer: 43 mL/min — ABNORMAL LOW (ref >60)
Glucose, Bld: 91 mg/dL (ref 70–99)
Potassium: 3.6 mmol/L (ref 3.5–5.1)
Sodium: 142 mmol/L (ref 135–145)
Total Bilirubin: 0.8 mg/dL (ref 0.3–1.2)
Total Protein: 5.2 g/dL — ABNORMAL LOW (ref 6.5–8.1)

## 2019-10-25 LAB — CULTURE, BLOOD (ROUTINE X 2)
Culture: NO GROWTH
Special Requests: ADEQUATE

## 2019-10-25 LAB — CBC WITH DIFFERENTIAL/PLATELET
Abs Immature Granulocytes: 0.05 10*3/uL (ref 0.00–0.07)
Basophils Absolute: 0 10*3/uL (ref 0.0–0.1)
Basophils Relative: 0 %
Eosinophils Absolute: 0 10*3/uL (ref 0.0–0.5)
Eosinophils Relative: 0 %
HCT: 29.8 % — ABNORMAL LOW (ref 36.0–46.0)
Hemoglobin: 9.2 g/dL — ABNORMAL LOW (ref 12.0–15.0)
Immature Granulocytes: 1 %
Lymphocytes Relative: 19 %
Lymphs Abs: 1.8 10*3/uL (ref 0.7–4.0)
MCH: 29.4 pg (ref 26.0–34.0)
MCHC: 30.9 g/dL (ref 30.0–36.0)
MCV: 95.2 fL (ref 80.0–100.0)
Monocytes Absolute: 0.7 10*3/uL (ref 0.1–1.0)
Monocytes Relative: 8 %
Neutro Abs: 6.6 10*3/uL (ref 1.7–7.7)
Neutrophils Relative %: 72 %
Platelets: 421 10*3/uL — ABNORMAL HIGH (ref 150–400)
RBC: 3.13 MIL/uL — ABNORMAL LOW (ref 3.87–5.11)
RDW: 15.3 % (ref 11.5–15.5)
WBC: 9.2 10*3/uL (ref 4.0–10.5)
nRBC: 0 % (ref 0.0–0.2)

## 2019-10-25 LAB — C-REACTIVE PROTEIN: CRP: 4.9 mg/dL — ABNORMAL HIGH (ref <1.0)

## 2019-10-25 LAB — PHOSPHORUS: Phosphorus: 2.4 mg/dL — ABNORMAL LOW (ref 2.5–4.6)

## 2019-10-25 LAB — MAGNESIUM: Magnesium: 2 mg/dL (ref 1.7–2.4)

## 2019-10-25 LAB — FERRITIN: Ferritin: 71 ng/mL (ref 11–307)

## 2019-10-25 LAB — D-DIMER, QUANTITATIVE: D-Dimer, Quant: 1.7 ug/mL-FEU — ABNORMAL HIGH (ref 0.00–0.50)

## 2019-10-25 NOTE — Progress Notes (Signed)
3 Days Post-Op   Subjective/Chief Complaint: Had some BMs No abdominal pain   Objective: Vital signs in last 24 hours: Temp:  [98.1 F (36.7 C)-99.4 F (37.4 C)] 99.4 F (37.4 C) (12/13 0800) Pulse Rate:  [68-105] 98 (12/13 0800) Resp:  [13-17] 16 (12/13 0800) BP: (107-123)/(55-77) 117/56 (12/13 0800) SpO2:  [96 %-99 %] 98 % (12/13 0800) Last BM Date: 10/23/19  Intake/Output from previous day: 12/12 0701 - 12/13 0700 In: 95.9 [IV Piggyback:95.9] Out: 1800 [Urine:1800] Intake/Output this shift: No intake/output data recorded.  General appearance: cooperative Resp: clear to auscultation bilaterally GI: soft, NT  Lab Results:  Recent Labs    10/24/19 0500 10/25/19 0235  WBC 12.3* 9.2  HGB 9.5* 9.2*  HCT 30.9* 29.8*  PLT 420* 421*   BMET Recent Labs    10/24/19 0500 10/25/19 0235  NA 141 142  K 3.5 3.6  CL 106 109  CO2 27 26  GLUCOSE 91 91  BUN 19 17  CREATININE 1.02* 1.16*  CALCIUM 8.6* 8.3*   PT/INR No results for input(s): LABPROT, INR in the last 72 hours. ABG No results for input(s): PHART, HCO3 in the last 72 hours.  Invalid input(s): PCO2, PO2  Studies/Results: No results found.  Anti-infectives: Anti-infectives (From admission, onward)   Start     Dose/Rate Route Frequency Ordered Stop   10/23/19 1215  piperacillin-tazobactam (ZOSYN) IVPB 3.375 g     3.375 g 12.5 mL/hr over 240 Minutes Intravenous Every 8 hours 10/23/19 1132     10/22/19 1100  hydroxychloroquine (PLAQUENIL) tablet 200 mg     200 mg Oral 2 times daily 10/22/19 0957     10/22/19 1000  doxycycline (VIBRA-TABS) tablet 100 mg     100 mg Oral 2 times daily 10/22/19 0957 10/27/19 0959      Assessment/Plan: Possible contained rectal perforation - I agree with Dr Georgette Dover, doubt perforation, exam benign, afeb, WBC 9 - continue fulls today - likely adv tomorrow Covid- per medical service FEN: fulls VTE: SCD's, Eliquis on hold, chemical prophylaxis per medicine, has ivc  filter ID: Zosyn 12/11>>   LOS: 3 days    LASHAWNDRA [email or web address] 10/25/2019

## 2019-10-25 NOTE — Progress Notes (Addendum)
PROGRESS NOTE    [name]  [id number] DOB: May 15, 1931 DOA: 10/22/2019 PCP: Binnie Rail, MD   Brief Narrative: Gloria Warren is a 85 y.o. female with medical history significant of RA; Raynaud's; HTN; HLD; Stage 3b-4 CKD; and DVT on Eliquis. Patient presented with bloody stools. COVID positive. IVC placed for PE protection in setting of DVT and GI bleeding. Incidentally, found to have a rectal perforation.   Assessment & Plan:   Principal Problem:   Acute lower GI bleeding Active Problems:   Essential hypertension   Rheumatoid arthritis ([place])   Chronic kidney disease (CKD) stage G3b   DVT (deep venous thrombosis) (HCC)   COVID-19   Depression   Possible rectal perforation Stool seen outside the lumen although general surgery assessment suggests this could be overread in setting of very tortuous sigmoid colon. Incidental finding for evaluation of GI bleeding. General surgery consulted by GI and note is pending. Started on Zosyn IV. Mild increase in leukocytosis. -General surgery recommendations: Zosyn IV  GI bleeding Complicated by Eliquis use for DVT. GI consulted and assessment is likely patient had a stercoral ulcer from chronic constipation that led to bleeding and eventual perforation. Hemoglobin stable. -CBC -Watch stools for worsening/continued bleeding  COVID-19 infection Diagnosed prior to admission and currently being treated with doxycycline which was continued on admission. She does not have any hypoxia or dyspnea. Chest x-ray significant for atelectasis. Procalcitonin of 0.25. CRP elevated but also in setting of rectal perforation. -Continue Doxycycline -Daily CMP, CBC, Ferritin, D-dimer, CRP  DVT Previously on Eliquis but presented with GI bleeding. IVC filter placed by Vascular Surgery on 12/10 with recommendation for likely lifelong placement.  Rheumatoid arthritis Started on Solu-Cortef as stress doses. On prednisone and Plaquenil as an  outpatient. Discontinued Solu-Cortef. -Continue Plaquenil -Continue Prednisone 7.5 mg (home dose)  Depression Per chart history, patient with emotional lability.  CKD stage 3b Stable   DVT prophylaxis: SCDs Code Status:   Code Status: Full Code Family Communication: None Disposition Plan: Discharge pending continued management of medical/surgical needs, including perforated rectum   Consultants:   Vascular surgery   GI  General surgery  Procedures:   IVC filter placement  Antimicrobials:  Doxycycline  Zosyn (12/11>>    Subjective: No dyspnea or chest pain.  Objective: Vitals:   10/24/19 1959 10/24/19 2339 10/25/19 0400 10/25/19 0800  BP: 113/77 117/72 (!) 123/55 (!) 117/56  Pulse: (!) 105 68 73 98  Resp: 17 13 13 16   Temp: 98.3 F (36.8 C) 98.1 F (36.7 C) 98.4 F (36.9 C) 99.4 F (37.4 C)  TempSrc: Oral Oral Oral Oral  SpO2: 98% 99% 97% 98%    Intake/Output Summary (Last 24 hours) at 10/25/2019 1331 Last data filed at 10/25/2019 0653 Gross per 24 hour  Intake 95.89 ml  Output 1800 ml  Net -1704.11 ml   There were no vitals filed for this visit.  Examination:  General exam: Appears calm and comfortable Respiratory system: Clear to auscultation. Respiratory effort normal. Cardiovascular system: S1 & S2 heard, RRR. No murmurs, rubs, gallops or clicks. Gastrointestinal system: Abdomen is nondistended, soft and nontender. No organomegaly or masses felt. Normal bowel sounds heard. Central nervous system: Alert and oriented. No focal neurological deficits. Extremities: No edema. No calf tenderness Skin: No cyanosis. No rashes Psychiatry: Judgement and insight appear normal. Mood & affect appropriate.      Data Reviewed: I have personally reviewed following labs and imaging studies  CBC: Recent Labs  Lab  10/20/19 1257 10/22/19 0600 10/23/19 0500 10/24/19 0500 10/25/19 0235  WBC 13.7* 13.6* 10.6* 12.3* 9.2  NEUTROABS 12.3* 10.5*  9.0* 9.2* 6.6  HGB 10.8* 10.5* 9.2* 9.5* 9.2*  HCT 35.5* 34.2* 30.4* 30.9* 29.8*  MCV 97.5 97.7 96.2 94.8 95.2  PLT 399 392 383 420* [id number]*   Basic Metabolic Panel: Recent Labs  Lab 10/20/19 1257 10/22/19 0600 10/23/19 0500 10/24/19 0500 10/25/19 0235  NA 137 140 141 141 142  K 4.1 4.0 4.4 3.5 3.6  CL 98 105 106 106 109  CO2 26 27 25 27 26   GLUCOSE 115* 99 83 91 91  BUN 42* 31* 21 19 17   CREATININE 1.38* 1.22* 1.03* 1.02* 1.16*  CALCIUM 9.5 9.3 8.8* 8.6* 8.3*  MG  --   --  1.9 1.9 2.0  PHOS  --   --  3.6 2.2* 2.4*   GFR: Estimated Creatinine Clearance: 27.9 mL/min (A) (by C-G formula based on SCr of 1.16 mg/dL (H)). Liver Function Tests: Recent Labs  Lab 10/20/19 1257 10/22/19 0600 10/23/19 0500 10/24/19 0500 10/25/19 0235  AST 23 19 21 23 22   ALT 22 22 21 20 22   ALKPHOS 78 65 58 54 52  BILITOT 0.2* 0.9 0.9 1.0 0.8  PROT 6.3* 5.9* 5.5* 5.4* 5.2*  ALBUMIN 2.7* 2.4* 2.5* 2.4* 2.3*   No results for input(s): LIPASE, AMYLASE in the last 168 hours. No results for input(s): AMMONIA in the last 168 hours. Coagulation Profile: Recent Labs  Lab 10/22/19 0600  INR 1.5*   Cardiac Enzymes: No results for input(s): CKTOTAL, CKMB, CKMBINDEX, TROPONINI in the last 168 hours. BNP (last 3 results) No results for input(s): PROBNP in the last 8760 hours. HbA1C: No results for input(s): HGBA1C in the last 72 hours. CBG: No results for input(s): GLUCAP in the last 168 hours. Lipid Profile: No results for input(s): CHOL, HDL, LDLCALC, TRIG, CHOLHDL, LDLDIRECT in the last 72 hours. Thyroid Function Tests: No results for input(s): TSH, T4TOTAL, FREET4, T3FREE, THYROIDAB in the last 72 hours. Anemia Panel: Recent Labs    10/24/19 0500 10/25/19 0235  FERRITIN 102 71   Sepsis Labs: Recent Labs  Lab 10/20/19 1257 10/22/19 1201  PROCALCITON 0.23 0.25  LATICACIDVEN 1.2  --     Recent Results (from the past 240 hour(s))  Blood Culture (routine x 2)     Status: None    Collection Time: 10/20/19  1:14 PM   Specimen: BLOOD  Result Value Ref Range Status   Specimen Description BLOOD LEFT ANTECUBITAL  Final   Special Requests   Final    BOTTLES DRAWN AEROBIC AND ANAEROBIC Blood Culture adequate volume   Culture   Final    NO GROWTH 5 DAYS Performed at [facility], [place] [address] [place], [place] [address]    Report Status 10/25/2019 FINAL  Final         Radiology Studies: No results found.      Scheduled Meds: . doxycycline  100 mg Oral BID  . DULoxetine  30 mg Oral Daily  . hydroxychloroquine  200 mg Oral BID  . loratadine  10 mg Oral Daily  . predniSONE  7.5 mg Oral Q breakfast  . sodium chloride flush  3 mL Intravenous Q12H   Continuous Infusions: . sodium chloride    . piperacillin-tazobactam (ZOSYN)  IV 3.375 g (10/25/19 0653)     LOS: 3 days     Cordelia Poche, MD [facility] 10/25/2019, 1:31 PM  If  7PM-7AM, please contact night-coverage [email or web address]

## 2019-10-26 DIAGNOSIS — U071 COVID-19: Secondary | ICD-10-CM

## 2019-10-26 DIAGNOSIS — K922 Gastrointestinal hemorrhage, unspecified: Secondary | ICD-10-CM

## 2019-10-26 LAB — TYPE AND SCREEN
ABO/RH(D): O POS
Antibody Screen: POSITIVE
Unit division: 0
Unit division: 0

## 2019-10-26 LAB — CBC WITH DIFFERENTIAL/PLATELET
Abs Immature Granulocytes: 0.05 10*3/uL (ref 0.00–0.07)
Basophils Absolute: 0 10*3/uL (ref 0.0–0.1)
Basophils Relative: 0 %
Eosinophils Absolute: 0 10*3/uL (ref 0.0–0.5)
Eosinophils Relative: 0 %
HCT: 33.1 % — ABNORMAL LOW (ref 36.0–46.0)
Hemoglobin: 9.8 g/dL — ABNORMAL LOW (ref 12.0–15.0)
Immature Granulocytes: 0 %
Lymphocytes Relative: 12 %
Lymphs Abs: 1.3 10*3/uL (ref 0.7–4.0)
MCH: 28.9 pg (ref 26.0–34.0)
MCHC: 29.6 g/dL — ABNORMAL LOW (ref 30.0–36.0)
MCV: 97.6 fL (ref 80.0–100.0)
Monocytes Absolute: 1 10*3/uL (ref 0.1–1.0)
Monocytes Relative: 9 %
Neutro Abs: 9.1 10*3/uL — ABNORMAL HIGH (ref 1.7–7.7)
Neutrophils Relative %: 79 %
Platelets: 428 10*3/uL — ABNORMAL HIGH (ref 150–400)
RBC: 3.39 MIL/uL — ABNORMAL LOW (ref 3.87–5.11)
RDW: 15.5 % (ref 11.5–15.5)
WBC: 11.5 10*3/uL — ABNORMAL HIGH (ref 4.0–10.5)
nRBC: 0 % (ref 0.0–0.2)

## 2019-10-26 LAB — C-REACTIVE PROTEIN: CRP: 3.4 mg/dL — ABNORMAL HIGH (ref <1.0)

## 2019-10-26 LAB — BPAM RBC
Blood Product Expiration Date: 202101112359
Blood Product Expiration Date: 202101112359
Unit Type and Rh: 5100
Unit Type and Rh: 5100

## 2019-10-26 LAB — COMPREHENSIVE METABOLIC PANEL
ALT: 21 U/L (ref 0–44)
AST: 21 U/L (ref 15–41)
Albumin: 2.2 g/dL — ABNORMAL LOW (ref 3.5–5.0)
Alkaline Phosphatase: 59 U/L (ref 38–126)
Anion gap: 7 (ref 5–15)
BUN: 14 mg/dL (ref 8–23)
CO2: 25 mmol/L (ref 22–32)
Calcium: 8.4 mg/dL — ABNORMAL LOW (ref 8.9–10.3)
Chloride: 109 mmol/L (ref 98–111)
Creatinine, Ser: 1.1 mg/dL — ABNORMAL HIGH (ref 0.44–1.00)
GFR calc Af Amer: 53 mL/min — ABNORMAL LOW (ref >60)
GFR calc non Af Amer: 45 mL/min — ABNORMAL LOW (ref >60)
Glucose, Bld: 87 mg/dL (ref 70–99)
Potassium: 3.7 mmol/L (ref 3.5–5.1)
Sodium: 141 mmol/L (ref 135–145)
Total Bilirubin: 0.6 mg/dL (ref 0.3–1.2)
Total Protein: 5.2 g/dL — ABNORMAL LOW (ref 6.5–8.1)

## 2019-10-26 LAB — PHOSPHORUS: Phosphorus: 2.4 mg/dL — ABNORMAL LOW (ref 2.5–4.6)

## 2019-10-26 LAB — SARS CORONAVIRUS 2 (TAT 6-24 HRS): SARS Coronavirus 2: NEGATIVE

## 2019-10-26 LAB — FERRITIN: Ferritin: 56 ng/mL (ref 11–307)

## 2019-10-26 LAB — MAGNESIUM: Magnesium: 1.9 mg/dL (ref 1.7–2.4)

## 2019-10-26 LAB — D-DIMER, QUANTITATIVE: D-Dimer, Quant: 2.14 ug/mL-FEU — ABNORMAL HIGH (ref 0.00–0.50)

## 2019-10-26 MED ORDER — LOPERAMIDE HCL 2 MG PO CAPS
2.0000 mg | ORAL_CAPSULE | Freq: Once | ORAL | Status: AC
  Administered 2019-10-26: 2 mg via ORAL
  Filled 2019-10-26: qty 1

## 2019-10-26 NOTE — Progress Notes (Signed)
Pharmacy Antibiotic Note  Gloria Warren is a 85 y.o. female admitted on 10/22/2019 with GIB.  PTA apixaban reversed.  Pharmacy has been consulted on 12/11 for Zosyn dosing for intra-abdominal infection. Rectal perforation ruled out by surgery.   Plan: Zosyn 3.375 g IV q8h EI Monitor clinical status, renal function and culture results daily. F/U Length of zosyn treatment with primary attending.    Temp (24hrs), Avg:98.6 F (37 C), Min:98.2 F (36.8 C), Max:98.9 F (37.2 C)  Recent Labs  Lab 10/20/19 1257 10/22/19 0600 10/23/19 0500 10/24/19 0500 10/25/19 0235 10/26/19 0500  WBC 13.7* 13.6* 10.6* 12.3* 9.2 11.5*  CREATININE 1.38* 1.22* 1.03* 1.02* 1.16* 1.10*  LATICACIDVEN 1.2  --   --   --   --   --     Estimated Creatinine Clearance: 29.4 mL/min (A) (by C-G formula based on SCr of 1.1 mg/dL (H)).    Allergies  Allergen Reactions  . Arava [Leflunomide]     Liver & kidney dysfunction  . Sulfonamide Derivatives     Itching & rash  . Remicade [Infliximab] Hives and Itching    Antimicrobials this admission: Zosyn 12/11>> Doxycycline po started 12/8 PTA>>  Dose adjustments this admission:   Microbiology results: 12/8 BCx: ngtd x 3 days  Gloria Warren A. Levada Dy, PharmD, BCPS, FNKF Clinical Pharmacist  [place] Please utilize Amion for appropriate phone number to reach the unit pharmacist ([place])   10/26/2019 9:48 AM

## 2019-10-26 NOTE — Progress Notes (Signed)
4 Days Post-Op   Subjective/Chief Complaint: Having diarrhea, no ab pain, taking po, no brbpr   Objective: Vital signs in last 24 hours: Temp:  [98.9 F (37.2 C)] 98.9 F (37.2 C) (12/13 1631) Pulse Rate:  [70-78] 70 (12/14 0500) Resp:  [17-20] 17 (12/14 0500) BP: (122-128)/(58-69) 128/69 (12/13 1631) SpO2:  [94 %-96 %] 94 % (12/14 0500) Last BM Date: 10/26/19  Intake/Output from previous day: 12/13 0701 - 12/14 0700 In: -  Out: 800 [Urine:800] Intake/Output this shift: No intake/output data recorded.  GI: soft nontender  Lab Results:  Recent Labs    10/25/19 0235 10/26/19 0500  WBC 9.2 11.5*  HGB 9.2* 9.8*  HCT 29.8* 33.1*  PLT 421* 428*   BMET Recent Labs    10/25/19 0235 10/26/19 0500  NA 142 141  K 3.6 3.7  CL 109 109  CO2 26 25  GLUCOSE 91 87  BUN 17 14  CREATININE 1.16* 1.10*  CALCIUM 8.3* 8.4*   PT/INR No results for input(s): LABPROT, INR in the last 72 hours. ABG No results for input(s): PHART, HCO3 in the last 72 hours.  Invalid input(s): PCO2, PO2  Studies/Results: No results found.  Anti-infectives: Anti-infectives (From admission, onward)   Start     Dose/Rate Route Frequency Ordered Stop   10/23/19 1215  piperacillin-tazobactam (ZOSYN) IVPB 3.375 g     3.375 g 12.5 mL/hr over 240 Minutes Intravenous Every 8 hours 10/23/19 1132     10/22/19 1100  hydroxychloroquine (PLAQUENIL) tablet 200 mg     200 mg Oral 2 times daily 10/22/19 0957     10/22/19 1000  doxycycline (VIBRA-TABS) tablet 100 mg     100 mg Oral 2 times daily 10/22/19 0957 10/27/19 0959      Assessment/Plan: Possible contained rectal perforation - I still agree with Dr Georgette Dover, doubt perforation, exam benign - can do soft diet -can repeat ct scan at some point as well- dont think inflammatory markers and wbc are related to any rectal perforation but only way to definitively know this will be contrast enema or ct with rectal contrast, could do that at any point if  medical service thinks she can go do ct/enema Covid- per medical service FEN: soft diet VTE: SCD's,Eliquis on hold, chemical prophylaxis per medicine, has ivc filter [id number] Rolm Bookbinder 10/26/2019

## 2019-10-26 NOTE — Progress Notes (Signed)
     [facility] Progress Note  CC:  GI bleed, rectal perforation  Subjective:  No further reports of bleeding.  Hgb stable at 9.8 grams this AM.  Surgery does not think that she has a rectal perforation.  No abdominal pain.  They have advanced her diet.  Objective:  Vital signs in last 24 hours: Temp:  [98.2 F (36.8 C)-98.9 F (37.2 C)] 98.2 F (36.8 C) (12/14 0939) Pulse Rate:  [70-81] 81 (12/14 0939) Resp:  [17-23] 23 (12/14 0939) BP: (122-129)/(57-69) 129/57 (12/14 0939) SpO2:  [94 %-97 %] 97 % (12/14 0939) Last BM Date: 10/26/19  Patient not examined due to Covid-19 status.  Intake/Output from previous day: 12/13 0701 - 12/14 0700 In: -  Out: 800 [Urine:800]  Lab Results: Recent Labs    10/24/19 0500 10/25/19 0235 10/26/19 0500  WBC 12.3* 9.2 11.5*  HGB 9.5* 9.2* 9.8*  HCT 30.9* 29.8* 33.1*  PLT 420* 421* 428*   BMET Recent Labs    10/24/19 0500 10/25/19 0235 10/26/19 0500  NA 141 142 141  K 3.5 3.6 3.7  CL 106 109 109  CO2 27 26 25   GLUCOSE 91 91 87  BUN 19 17 14   CREATININE 1.02* 1.16* 1.10*  CALCIUM 8.6* 8.3* 8.4*   LFT Recent Labs    10/26/19 0500  PROT 5.2*  ALBUMIN 2.2*  AST 21  ALT 21  ALKPHOS 59  BILITOT 0.6   Assessment / Plan: *Painless hematochezia: The suspicion was diverticular bleed.  She had a CT scan of the abdomen pelvis without contrast on 12/11 that showed a suspected contained rectal perforation.  Suspicion was that she had bleeding and perforation from a stercoral ulcer.  Neoplasm also consideration.  Surgery has been seeing the patient and does not think that she has a perforation and are debating about re-imaging.  *Extensive R LE DVT, Eliquis started 11/13.  On hold for now.  Had IVC filter placed 12/10.  *Covid 19 + with PNA, on abx  *CKD  *Rheumatoid arthritis with symptomatic C spine stenosis.  Chronic 10 mg Prednisone and Plaquenil.   *Husband of 21 years died 90 month ago.    -Continue IV abx. -Will await results of any repeat imaging.  No plans for endoscopic evaluation for now until Covid treated/time progress and after imaging shows/confirms healing of the rectal findings.   LOS: 4 days   Laban Emperor. Abeer Iversen  10/26/2019, 10:13 AM

## 2019-10-26 NOTE — Progress Notes (Addendum)
PROGRESS NOTE    CICLALI DECOUX  [id number] DOB: 10/23/1931 DOA: 10/22/2019 PCP: Binnie Rail, MD   Brief Narrative: Gloria Warren is a 85 y.o. female with medical history significant of RA; Raynaud's; HTN; HLD; Stage 3b-4 CKD; and DVT on Eliquis. Patient presented with bloody stools. COVID positive. IVC placed for PE protection in setting of DVT and GI bleeding. Incidentally, found to have a rectal perforation.   Assessment & Plan:   Principal Problem:   Acute lower GI bleeding Active Problems:   Essential hypertension   Rheumatoid arthritis ([place])   Chronic kidney disease (CKD) stage G3b   DVT (deep venous thrombosis) (HCC)   COVID-19   Depression   Possible rectal perforation Stool seen outside the lumen although general surgery assessment suggests this could be overread in setting of very tortuous sigmoid colon. Incidental finding for evaluation of GI bleeding. General surgery consulted by GI and note is pending. Started on Zosyn IV. Mild increase in leukocytosis. -General surgery recommendations: Zosyn IV, CT with rectal contrast vs endoscopy -GI recommendations: endoscopy at some point, but recommending imaging.  GI bleeding Complicated by Eliquis use for DVT. GI consulted and assessment is likely patient had a stercoral ulcer from chronic constipation that led to bleeding and eventual perforation. Hemoglobin stable. -CBC -Watch stools for worsening/continued bleeding  COVID-19 infection Diagnosed prior to admission and currently being treated with doxycycline which was continued on admission. She does not have any hypoxia or dyspnea. Chest x-ray significant for atelectasis. Procalcitonin of 0.25. CRP elevated with a peak of 15.7, trending down. Repeat COVID-19 testing negative but symptoms correlate with likely infection. -Continue Doxycycline -Daily CMP, CBC, Ferritin, D-dimer, CRP -Per ID, repeat COVID-19 testing on 12/15  DVT Previously on Eliquis but  presented with GI bleeding. IVC filter placed by Vascular Surgery on 12/10 with recommendation for likely lifelong placement.  Rheumatoid arthritis Started on Solu-Cortef as stress doses. On prednisone and Plaquenil as an outpatient. Discontinued Solu-Cortef. -Continue Plaquenil -Continue Prednisone 7.5 mg (home dose)  Depression Per chart history, patient with emotional lability.  CKD stage 3b Stable   DVT prophylaxis: SCDs Code Status:   Code Status: Full Code Family Communication: None Disposition Plan: Discharge pending continued management of medical/surgical needs, including perforated rectum   Consultants:   Vascular surgery  [facility]  General surgery  Procedures:   IVC filter placement  Antimicrobials:  Doxycycline  Zosyn (12/11>>    Subjective: No chest pain. Diarrhea.  Objective: Vitals:   10/25/19 1216 10/25/19 1631 10/26/19 0500 10/26/19 0939  BP: (!) 122/58 128/69  (!) 129/57  Pulse: 78  70 81  Resp: 20 19 17  (!) 23  Temp:  98.9 F (37.2 C)  98.2 F (36.8 C)  TempSrc:  Oral  Oral  SpO2: 96% 96% 94% 97%    Intake/Output Summary (Last 24 hours) at 10/26/2019 1111 Last data filed at 10/25/2019 1634 Gross per 24 hour  Intake --  Output 800 ml  Net -800 ml   There were no vitals filed for this visit.  Examination:  General exam: Appears calm and comfortable Respiratory system: Clear to auscultation. Respiratory effort normal. Cardiovascular system: S1 & S2 heard, RRR. No murmurs, rubs, gallops or clicks. Gastrointestinal system: Abdomen is nondistended, soft and nontender. No organomegaly or masses felt. Normal bowel sounds heard. Central nervous system: Alert and oriented. No focal neurological deficits. Extremities: No edema. No calf tenderness Skin: No cyanosis. No rashes Psychiatry: Judgement and insight appear normal. Mood &  affect appropriate.     Data Reviewed: I have personally reviewed following labs and imaging  studies  CBC: Recent Labs  Lab 10/22/19 0600 10/23/19 0500 10/24/19 0500 10/25/19 0235 10/26/19 0500  WBC 13.6* 10.6* 12.3* 9.2 11.5*  NEUTROABS 10.5* 9.0* 9.2* 6.6 9.1*  HGB 10.5* 9.2* 9.5* 9.2* 9.8*  HCT 34.2* 30.4* 30.9* 29.8* 33.1*  MCV 97.7 96.2 94.8 95.2 97.6  PLT 392 383 420* 421* [id number]*   Basic Metabolic Panel: Recent Labs  Lab 10/22/19 0600 10/23/19 0500 10/24/19 0500 10/25/19 0235 10/26/19 0500  NA 140 141 141 142 141  K 4.0 4.4 3.5 3.6 3.7  CL 105 106 106 109 109  CO2 27 25 27 26 25   GLUCOSE 99 83 91 91 87  BUN 31* 21 19 17 14   CREATININE 1.22* 1.03* 1.02* 1.16* 1.10*  CALCIUM 9.3 8.8* 8.6* 8.3* 8.4*  MG  --  1.9 1.9 2.0 1.9  PHOS  --  3.6 2.2* 2.4* 2.4*   GFR: Estimated Creatinine Clearance: 29.4 mL/min (A) (by C-G formula based on SCr of 1.1 mg/dL (H)). Liver Function Tests: Recent Labs  Lab 10/22/19 0600 10/23/19 0500 10/24/19 0500 10/25/19 0235 10/26/19 0500  AST 19 21 23 22 21   ALT 22 21 20 22 21   ALKPHOS 65 58 54 52 59  BILITOT 0.9 0.9 1.0 0.8 0.6  PROT 5.9* 5.5* 5.4* 5.2* 5.2*  ALBUMIN 2.4* 2.5* 2.4* 2.3* 2.2*   No results for input(s): LIPASE, AMYLASE in the last 168 hours. No results for input(s): AMMONIA in the last 168 hours. Coagulation Profile: Recent Labs  Lab 10/22/19 0600  INR 1.5*   Cardiac Enzymes: No results for input(s): CKTOTAL, CKMB, CKMBINDEX, TROPONINI in the last 168 hours. BNP (last 3 results) No results for input(s): PROBNP in the last 8760 hours. HbA1C: No results for input(s): HGBA1C in the last 72 hours. CBG: No results for input(s): GLUCAP in the last 168 hours. Lipid Profile: No results for input(s): CHOL, HDL, LDLCALC, TRIG, CHOLHDL, LDLDIRECT in the last 72 hours. Thyroid Function Tests: No results for input(s): TSH, T4TOTAL, FREET4, T3FREE, THYROIDAB in the last 72 hours. Anemia Panel: Recent Labs    10/24/19 0500 10/25/19 0235  FERRITIN 102 71   Sepsis Labs: Recent Labs  Lab 10/20/19 1257  10/22/19 1201  PROCALCITON 0.23 0.25  LATICACIDVEN 1.2  --     Recent Results (from the past 240 hour(s))  Blood Culture (routine x 2)     Status: None   Collection Time: 10/20/19  1:14 PM   Specimen: BLOOD  Result Value Ref Range Status   Specimen Description BLOOD LEFT ANTECUBITAL  Final   Special Requests   Final    BOTTLES DRAWN AEROBIC AND ANAEROBIC Blood Culture adequate volume   Culture   Final    NO GROWTH 5 DAYS Performed at [facility], [place] [address] [place], [place] [address]    Report Status 10/25/2019 FINAL  Final  SARS CORONAVIRUS 2 (TAT 6-24 HRS) Nasopharyngeal Nasopharyngeal Swab     Status: None   Collection Time: 10/25/19  9:24 PM   Specimen: Nasopharyngeal Swab  Result Value Ref Range Status   SARS Coronavirus 2 NEGATIVE NEGATIVE Final    Comment: (NOTE) SARS-CoV-2 target nucleic acids are NOT DETECTED. The SARS-CoV-2 RNA is generally detectable in upper and lower respiratory specimens during the acute phase of infection. Negative results do not preclude SARS-CoV-2 infection, do not rule out co-infections with other pathogens, and should not  be used as the sole basis for treatment or other patient management decisions. Negative results must be combined with clinical observations, patient history, and epidemiological information. The expected result is Negative. Fact Sheet for Patients: SugarRoll.be Fact Sheet for Healthcare Providers: https://www.woods-mathews.com/ This test is not yet approved or cleared by the [place] FDA and  has been authorized for detection and/or diagnosis of SARS-CoV-2 by FDA under an Emergency Use Authorization (EUA). This EUA will remain  in effect (meaning this test can be used) for the duration of the COVID-19 declaration under Section 56 4(b)(1) of the Act, 21 U.S.C. section 360bbb-3(b)(1), unless the authorization is terminated or revoked sooner. Performed at [facility], [place] [address], [place], [place] [address]          Radiology Studies: No results found.      Scheduled Meds: . doxycycline  100 mg Oral BID  . DULoxetine  30 mg Oral Daily  . hydroxychloroquine  200 mg Oral BID  . loratadine  10 mg Oral Daily  . predniSONE  7.5 mg Oral Q breakfast  . sodium chloride flush  3 mL Intravenous Q12H   Continuous Infusions: . sodium chloride    . piperacillin-tazobactam (ZOSYN)  IV 3.375 g (10/26/19 0511)     LOS: 4 days     Cordelia Poche, MD [facility] 10/26/2019, 11:11 AM  If 7PM-7AM, please contact night-coverage [email or web address]

## 2019-10-27 ENCOUNTER — Encounter (HOSPITAL_COMMUNITY): Payer: Self-pay | Admitting: Internal Medicine

## 2019-10-27 ENCOUNTER — Inpatient Hospital Stay (HOSPITAL_COMMUNITY): Payer: Medicare Other

## 2019-10-27 LAB — C-REACTIVE PROTEIN: CRP: 4.2 mg/dL — ABNORMAL HIGH (ref <1.0)

## 2019-10-27 LAB — CBC WITH DIFFERENTIAL/PLATELET
Abs Immature Granulocytes: 0.03 10*3/uL (ref 0.00–0.07)
Basophils Absolute: 0 10*3/uL (ref 0.0–0.1)
Basophils Relative: 0 %
Eosinophils Absolute: 0 10*3/uL (ref 0.0–0.5)
Eosinophils Relative: 0 %
HCT: 31.8 % — ABNORMAL LOW (ref 36.0–46.0)
Hemoglobin: 9.6 g/dL — ABNORMAL LOW (ref 12.0–15.0)
Immature Granulocytes: 0 %
Lymphocytes Relative: 19 %
Lymphs Abs: 1.7 10*3/uL (ref 0.7–4.0)
MCH: 28.8 pg (ref 26.0–34.0)
MCHC: 30.2 g/dL (ref 30.0–36.0)
MCV: 95.5 fL (ref 80.0–100.0)
Monocytes Absolute: 1.1 10*3/uL — ABNORMAL HIGH (ref 0.1–1.0)
Monocytes Relative: 11 %
Neutro Abs: 6.4 10*3/uL (ref 1.7–7.7)
Neutrophils Relative %: 70 %
Platelets: 329 10*3/uL (ref 150–400)
RBC: 3.33 MIL/uL — ABNORMAL LOW (ref 3.87–5.11)
RDW: 15.4 % (ref 11.5–15.5)
WBC: 9.3 10*3/uL (ref 4.0–10.5)
nRBC: 0 % (ref 0.0–0.2)

## 2019-10-27 LAB — COMPREHENSIVE METABOLIC PANEL
ALT: 25 U/L (ref 0–44)
AST: 23 U/L (ref 15–41)
Albumin: 2.2 g/dL — ABNORMAL LOW (ref 3.5–5.0)
Alkaline Phosphatase: 58 U/L (ref 38–126)
Anion gap: 8 (ref 5–15)
BUN: 17 mg/dL (ref 8–23)
CO2: 23 mmol/L (ref 22–32)
Calcium: 8.3 mg/dL — ABNORMAL LOW (ref 8.9–10.3)
Chloride: 109 mmol/L (ref 98–111)
Creatinine, Ser: 1.12 mg/dL — ABNORMAL HIGH (ref 0.44–1.00)
GFR calc Af Amer: 52 mL/min — ABNORMAL LOW (ref >60)
GFR calc non Af Amer: 44 mL/min — ABNORMAL LOW (ref >60)
Glucose, Bld: 88 mg/dL (ref 70–99)
Potassium: 4 mmol/L (ref 3.5–5.1)
Sodium: 140 mmol/L (ref 135–145)
Total Bilirubin: 0.3 mg/dL (ref 0.3–1.2)
Total Protein: 5.2 g/dL — ABNORMAL LOW (ref 6.5–8.1)

## 2019-10-27 LAB — D-DIMER, QUANTITATIVE: D-Dimer, Quant: 2.23 ug/mL-FEU — ABNORMAL HIGH (ref 0.00–0.50)

## 2019-10-27 LAB — FERRITIN: Ferritin: 67 ng/mL (ref 11–307)

## 2019-10-27 LAB — MAGNESIUM: Magnesium: 2 mg/dL (ref 1.7–2.4)

## 2019-10-27 LAB — PHOSPHORUS: Phosphorus: 2.5 mg/dL (ref 2.5–4.6)

## 2019-10-27 MED ORDER — IOHEXOL 350 MG/ML SOLN
80.0000 mL | Freq: Once | INTRAVENOUS | Status: AC | PRN
  Administered 2019-10-27: 80 mL via INTRAVENOUS

## 2019-10-27 NOTE — Evaluation (Signed)
Physical Therapy Evaluation Patient Details Name: Gloria Warren MRN: [id number] DOB: 1931-09-07 Today's Date: 10/27/2019   History of Present Illness  85 yo admitted with GIB and COVID + with IVC placed. Possible rectal perforation. PMHx: RA, Raynaud's, HTN, HLD, CKD, DVT on Eliquis  Clinical Impression  Pt labile throughout session regarding loss of spouse 6 weeks ago and pt overwhelmed with hospital admission. Pt reports son live with her now that spouse passes and that she has caregivers anytime he is not there. Pt reports she can normally get from bed to chair unassisted if she is feeling well but has assist as needed and that they can provide heavy physical assist pt reports they can handle her at mod-max current level of assist. Pt with decreased strength, function, and cognition who will benefit from acute therapy to maximize mobility, safety and independence to decrease burden of care. If family /caregivers able to assist with heavy physical burden home is possible however, if not SNF recommended.   HR 87, SpO2 97% on RA    Follow Up Recommendations Home health PT;Supervision/Assistance - 24 hour;SNF(pending caregiver ability to provide 24hr mod-max assist)    Equipment Recommendations  Hospital bed    Recommendations for Other Services OT consult     Precautions / Restrictions Precautions Precautions: Fall Precaution Comments: pt reports no falls but requires assist for mobility      Mobility  Bed Mobility Overal bed mobility: Needs Assistance Bed Mobility: Supine to Sit;Sit to Supine     Supine to sit: Max assist Sit to supine: Max assist   General bed mobility comments: max assist to pivot to left side of bed with pad and increased time. Total assist to fully scoot pelvis to EOB. Pt unable to scoot in sitting toward HOB and required max assist. Return to bed with max assist and total in trendelenburg to slide toward [facility]  Transfers Overall transfer level: Needs  assistance   Transfers: Sit to/from Stand Sit to Stand: Max assist         General transfer comment: attempted to stand x 2 trials with face to face technique, knees blocked and physical assist but pt unable to rise from surface  Ambulation/Gait             General Gait Details: unable  Stairs            Wheelchair Mobility    Modified Rankin (Stroke Patients Only)       Balance Overall balance assessment: Needs assistance Sitting-balance support: Feet supported;No upper extremity supported Sitting balance-Leahy Scale: Fair Sitting balance - Comments: able to sit without assist EOB                                     Pertinent Vitals/Pain Pain Assessment: 0-10 Pain Score: 5  Pain Location: generalized Pain Descriptors / Indicators: Aching Pain Intervention(s): Limited activity within patient's tolerance;Repositioned;Monitored during session    Home Living Family/patient expects to be discharged to:: Private residence Living Arrangements: Children Available Help at Discharge: Family;Personal care attendant;Available 24 hours/day Type of Home: House Home Access: Ramped entrance     Home Layout: One level Home Equipment: Programme researcher, broadcasting/film/video - 2 wheels;Cane - single point;Toilet riser      Prior Function Level of Independence: Needs assistance   Gait / Transfers Assistance Needed: uses transport chair, doesn't walk more than a few steps  ADL's / Homemaking Assistance Needed:  assist of aide for bathing and dressing. Son at night  Comments: Spouse passed 6 weeks ago     Hand Dominance        Extremity/Trunk Assessment   Upper Extremity Assessment Upper Extremity Assessment: Generalized weakness    Lower Extremity Assessment Lower Extremity Assessment: Generalized weakness    Cervical / Trunk Assessment Cervical / Trunk Assessment: Kyphotic  Communication   Communication: HOH  Cognition Arousal/Alertness:  Awake/alert Behavior During Therapy: (labile stating emotionally overwhelmed with admission and loss of spouse) Overall Cognitive Status: Impaired/Different from baseline Area of Impairment: Safety/judgement                         Safety/Judgement: Decreased awareness of deficits;Decreased awareness of safety            General Comments      Exercises     Assessment/Plan    PT Assessment Patient needs continued PT services  PT Problem List Decreased strength;Decreased mobility;Decreased safety awareness;Decreased activity tolerance;Decreased balance;Decreased knowledge of use of DME;Pain;Decreased cognition       PT Treatment Interventions DME instruction;Therapeutic exercise;Functional mobility training;Therapeutic activities;Patient/family education;Cognitive remediation    PT Goals (Current goals can be found in the Care Plan section)  Acute Rehab PT Goals Patient Stated Goal: return home to my herb garden PT Goal Formulation: With patient Time For Goal Achievement: 11/10/19 Potential to Achieve Goals: Fair    Frequency Min 3X/week   Barriers to discharge   pt reports 24hr assist at home    Co-evaluation               AM-PAC PT "6 Clicks" Mobility  Outcome Measure Help needed turning from your back to your side while in a flat bed without using bedrails?: A Lot Help needed moving from lying on your back to sitting on the side of a flat bed without using bedrails?: Total Help needed moving to and from a bed to a chair (including a wheelchair)?: Total Help needed standing up from a chair using your arms (e.g., wheelchair or bedside chair)?: Total Help needed to walk in hospital room?: Total Help needed climbing 3-5 steps with a railing? : Total 6 Click Score: 7    End of Session Equipment Utilized During Treatment: Gait belt Activity Tolerance: Patient limited by pain;Patient limited by fatigue Patient left: in bed;with call bell/phone within  reach;with bed alarm set;with nursing/sitter in room Nurse Communication: Mobility status PT Visit Diagnosis: Other abnormalities of gait and mobility (R26.89);Difficulty in walking, not elsewhere classified (R26.2);Muscle weakness (generalized) (M62.81)    Time: [id number] PT Time Calculation (min) (ACUTE ONLY): 29 min   Charges:   PT Evaluation $PT Eval Moderate Complexity: 1 Mod PT Treatments $Therapeutic Activity: 8-22 mins       Gloria Warren, PT Acute Rehabilitation Services Pager: [phone] Office: [phone]   Gloria Warren 10/27/2019, 1:20 PM

## 2019-10-27 NOTE — Progress Notes (Signed)
PROGRESS NOTE    Gloria Warren  [id number] DOB: 09-02-1931 DOA: 10/22/2019 PCP: Binnie Rail, MD   Brief Narrative: Gloria Warren is a 85 y.o. female with medical history significant of RA; Raynaud's; HTN; HLD; Stage 3b-4 CKD; and DVT on Eliquis. Patient presented with bloody stools. COVID positive. IVC placed for PE protection in setting of DVT and GI bleeding. Incidentally, found to have a possible rectal perforation. Repeat CT abdomen/pelvis pending.   Assessment & Plan:   Principal Problem:   Acute lower GI bleeding Active Problems:   Essential hypertension   Rheumatoid arthritis ([place])   Chronic kidney disease (CKD) stage G3b   DVT (deep venous thrombosis) (HCC)   COVID-19   Depression   Possible rectal perforation Stool seen outside the lumen although general surgery assessment suggests this could be overread in setting of very tortuous sigmoid colon. Incidental finding for evaluation of GI bleeding. General surgery consulted by GI and note is pending. Started on Zosyn IV. Mild increase in leukocytosis. -General surgery recommendations: Zosyn IV, CT with rectal contrast vs endoscopy -GI recommendations: endoscopy at some point, but recommending imaging -CT abdomen pending  GI bleeding Complicated by Eliquis use for DVT. GI consulted and assessment is likely patient had a stercoral ulcer from chronic constipation that led to bleeding and eventual perforation. Hemoglobin stable. -CBC -Watch stools for worsening/continued bleeding  COVID-19 infection Diagnosed prior to admission and currently being treated with doxycycline which was continued on admission. She does not have any hypoxia or dyspnea. Chest x-ray significant for atelectasis. Procalcitonin of 0.25. CRP elevated with a peak of 15.7, trending down. Repeat COVID-19 testing negative. Patient states she never had symptoms of chest pain, cough or dyspnea. -Continue Doxycycline -Daily CMP, CBC, Ferritin,  D-dimer, CRP -Per ID, repeat COVID-19 testing today  DVT Previously on Eliquis but presented with GI bleeding. IVC filter placed by Vascular Surgery on 12/10 with recommendation for likely lifelong placement.  Rheumatoid arthritis Started on Solu-Cortef as stress doses. On prednisone and Plaquenil as an outpatient. Discontinued Solu-Cortef. -Continue Plaquenil -Continue Prednisone 7.5 mg (home dose)  Depression Per chart history, patient with emotional lability.  CKD stage 3b Stable   DVT prophylaxis: SCDs Code Status:   Code Status: Full Code Family Communication: None Disposition Plan: Discharge pending continued management of medical/surgical needs   Consultants:   Vascular surgery  [facility]  General surgery  Procedures:   IVC filter placement  Antimicrobials:  Doxycycline  Zosyn (12/11>>    Subjective: No abdominal pain. No dyspnea or chest pain.  Objective: Vitals:   10/26/19 1722 10/26/19 2000 10/27/19 0000 10/27/19 0400  BP:  124/67 125/68 (!) 116/53  Pulse: (!) 113 (!) 112 99 67  Resp: 20  15 14   Temp:  97.7 F (36.5 C) 97.9 F (36.6 C) 97.9 F (36.6 C)  TempSrc:  Oral Oral Oral  SpO2: 98% 98% 96% 97%    Intake/Output Summary (Last 24 hours) at 10/27/2019 0741 Last data filed at 10/27/2019 [id number] Gross per 24 hour  Intake 290 ml  Output 800 ml  Net -510 ml   There were no vitals filed for this visit.  Examination:  General exam: Appears calm and comfortable Respiratory system: Clear to auscultation. Respiratory effort normal. Cardiovascular system: S1 & S2 heard, RRR. No murmurs, rubs, gallops or clicks. Gastrointestinal system: Abdomen is nondistended, soft and nontender. No organomegaly or masses felt. Normal bowel sounds heard. Central nervous system: Alert and oriented. No focal neurological deficits. Extremities:  No edema. No calf tenderness Skin: No cyanosis. No rashes Psychiatry: Judgement and insight appear normal. Mood &  affect appropriate.     Data Reviewed: I have personally reviewed following labs and imaging studies  CBC: Recent Labs  Lab 10/23/19 0500 10/24/19 0500 10/25/19 0235 10/26/19 0500 10/27/19 0500  WBC 10.6* 12.3* 9.2 11.5* 9.3  NEUTROABS 9.0* 9.2* 6.6 9.1* 6.4  HGB 9.2* 9.5* 9.2* 9.8* 9.6*  HCT 30.4* 30.9* 29.8* 33.1* 31.8*  MCV 96.2 94.8 95.2 97.6 95.5  PLT 383 420* 421* 428* [id number]   Basic Metabolic Panel: Recent Labs  Lab 10/23/19 0500 10/24/19 0500 10/25/19 0235 10/26/19 0500 10/27/19 0500  NA 141 141 142 141 140  K 4.4 3.5 3.6 3.7 4.0  CL 106 106 109 109 109  CO2 25 27 26 25 23   GLUCOSE 83 91 91 87 88  BUN 21 19 17 14 17   CREATININE 1.03* 1.02* 1.16* 1.10* 1.12*  CALCIUM 8.8* 8.6* 8.3* 8.4* 8.3*  MG 1.9 1.9 2.0 1.9 2.0  PHOS 3.6 2.2* 2.4* 2.4* 2.5   GFR: Estimated Creatinine Clearance: 28.9 mL/min (A) (by C-G formula based on SCr of 1.12 mg/dL (H)). Liver Function Tests: Recent Labs  Lab 10/23/19 0500 10/24/19 0500 10/25/19 0235 10/26/19 0500 10/27/19 0500  AST 21 23 22 21 23   ALT 21 20 22 21 25   ALKPHOS 58 54 52 59 58  BILITOT 0.9 1.0 0.8 0.6 0.3  PROT 5.5* 5.4* 5.2* 5.2* 5.2*  ALBUMIN 2.5* 2.4* 2.3* 2.2* 2.2*   No results for input(s): LIPASE, AMYLASE in the last 168 hours. No results for input(s): AMMONIA in the last 168 hours. Coagulation Profile: Recent Labs  Lab 10/22/19 0600  INR 1.5*   Cardiac Enzymes: No results for input(s): CKTOTAL, CKMB, CKMBINDEX, TROPONINI in the last 168 hours. BNP (last 3 results) No results for input(s): PROBNP in the last 8760 hours. HbA1C: No results for input(s): HGBA1C in the last 72 hours. CBG: No results for input(s): GLUCAP in the last 168 hours. Lipid Profile: No results for input(s): CHOL, HDL, LDLCALC, TRIG, CHOLHDL, LDLDIRECT in the last 72 hours. Thyroid Function Tests: No results for input(s): TSH, T4TOTAL, FREET4, T3FREE, THYROIDAB in the last 72 hours. Anemia Panel: Recent Labs     10/25/19 0235 10/26/19 0500  FERRITIN 71 56   Sepsis Labs: Recent Labs  Lab 10/20/19 1257 10/22/19 1201  PROCALCITON 0.23 0.25  LATICACIDVEN 1.2  --     Recent Results (from the past 240 hour(s))  Blood Culture (routine x 2)     Status: None   Collection Time: 10/20/19  1:14 PM   Specimen: BLOOD  Result Value Ref Range Status   Specimen Description BLOOD LEFT ANTECUBITAL  Final   Special Requests   Final    BOTTLES DRAWN AEROBIC AND ANAEROBIC Blood Culture adequate volume   Culture   Final    NO GROWTH 5 DAYS Performed at [facility], [place] [address] [place], [place] [address]    Report Status 10/25/2019 FINAL  Final  SARS CORONAVIRUS 2 (TAT 6-24 HRS) Nasopharyngeal Nasopharyngeal Swab     Status: None   Collection Time: 10/25/19  9:24 PM   Specimen: Nasopharyngeal Swab  Result Value Ref Range Status   SARS Coronavirus 2 NEGATIVE NEGATIVE Final    Comment: (NOTE) SARS-CoV-2 target nucleic acids are NOT DETECTED. The SARS-CoV-2 RNA is generally detectable in upper and lower respiratory specimens during the acute phase of infection. Negative results do not  preclude SARS-CoV-2 infection, do not rule out co-infections with other pathogens, and should not be used as the sole basis for treatment or other patient management decisions. Negative results must be combined with clinical observations, patient history, and epidemiological information. The expected result is Negative. Fact Sheet for Patients: SugarRoll.be Fact Sheet for Healthcare Providers: https://www.woods-mathews.com/ This test is not yet approved or cleared by the [place] FDA and  has been authorized for detection and/or diagnosis of SARS-CoV-2 by FDA under an Emergency Use Authorization (EUA). This EUA will remain  in effect (meaning this test can be used) for the duration of the COVID-19 declaration under Section 56 4(b)(1) of the Act, 21 U.S.C. section  360bbb-3(b)(1), unless the authorization is terminated or revoked sooner. Performed at [facility], [place] [address], [place], [place] [address]          Radiology Studies: No results found.      Scheduled Meds: . DULoxetine  30 mg Oral Daily  . hydroxychloroquine  200 mg Oral BID  . loratadine  10 mg Oral Daily  . predniSONE  7.5 mg Oral Q breakfast  . sodium chloride flush  3 mL Intravenous Q12H   Continuous Infusions: . sodium chloride    . piperacillin-tazobactam (ZOSYN)  IV 3.375 g (10/27/19 [id number])     LOS: 5 days     Cordelia Poche, MD [facility] 10/27/2019, 7:41 AM  If 7PM-7AM, please contact night-coverage [email or web address]

## 2019-10-27 NOTE — TOC Initial Note (Addendum)
Transition of Care [facility]) - Initial/Assessment Note    Patient Details  Name: Gloria Warren MRN: [id number] Date of Birth: [birth year]  [facility] CM/SW Contact:    Maryclare Labrador, RN Phone Number: 10/27/2019, 4:38 PM  Clinical Narrative:  CM spoke with pt via phone.  PTA from home with full time caregivers.  Pt informed CM that she already has caregivers during the day that stay with her until her son gets off from work - so she has 24/7 coverage. Pt informed CM that caregivers assist with ADLs when needed.  Pt states caregivers can assist with what ever is required (therapy documented concerns with heavy physical burden) - pt became defensive when CM asked this question.  Pt declined for CM to speak with son regarding therapy recommendations.   Pt informed CM that she has a small wheelchair in the home, pt declined both HH and hospital bed as recommended.  Pt informed CM that her son will transport her home via private vehicle.                   Expected Discharge Plan: [place] Barriers to Discharge: Continued Medical Work up   Patient Goals and CMS Choice        Expected Discharge Plan and Services Expected Discharge Plan: [place]       Living arrangements for the past 2 months: Single Family Home                                      Prior Living Arrangements/Services Living arrangements for the past 2 months: Single Family Home Lives with:: Adult Children Patient language and need for interpreter reviewed:: Yes Do you feel safe going back to the place where you live?: Yes      Need for Family Participation in Patient Care: Yes (Comment) Care giver support system in place?: Yes (comment) Current home services: DME(small wheelchair/transport chair)    Activities of Daily Living      Permission Sought/Granted                  Emotional Assessment   Attitude/Demeanor/Rapport: Gracious,  Charismatic, Self-Confident, Engaged Affect (typically observed): Accepting Orientation: : Oriented to Self, Oriented to Place, Oriented to  Time, Oriented to Situation      Admission diagnosis:  Coagulopathy ([place]) [D68.9] Acute lower GI bleeding [K92.2] Lower GI bleed [K92.2] COVID-19 virus infection [U07.1] Patient Active Problem List   Diagnosis Date Noted  . Acute lower GI bleeding 10/22/2019  . Depression 10/22/2019  . COVID-19 10/21/2019  . DVT (deep venous thrombosis) ([place]) 10/12/2019  . Pain and swelling of right lower leg 09/24/2019  . Nausea 06/29/2019  . Vitamin D deficiency 06/29/2019  . Osteoarthritis of both feet 09/25/2016  . Raynaud disease 09/25/2016  . Primary osteoarthritis of both knees 09/24/2016  . Primary osteoarthritis of both hands 09/24/2016  . Chronic kidney disease (CKD) stage G3b 09/24/2016  . Leg edema 11/28/2015  . Psoriasis 03/11/2013  . SKIN CANCER, HX OF 06/26/2010  . Allergic rhinitis 06/23/2009  . Prediabetes 06/23/2009  . Rheumatoid arthritis ([place]) 02/25/2008  . Hyperlipidemia 11/17/2007  . Essential hypertension 11/17/2007   PCP:  Binnie Rail, MD Pharmacy:   [facility] [place], [address] AT [place] OF [facility]  [place] [address] Phone: [phone] Fax: [phone]     Social Determinants of Health (SDOH) Interventions    Readmission Risk Interventions No flowsheet data found.

## 2019-10-27 NOTE — Progress Notes (Signed)
[facility] Progress Note  5 Days Post-Op  Subjective: Patient reports CT was very unpleasant. Denies abdominal pain. Having some nausea but no vomiting. Patient reports she is passing lots of flatus. No BM today, was having diarrhea recently.   Objective: Vital signs in last 24 hours: Temp:  [97.7 F (36.5 C)-98.2 F (36.8 C)] 97.9 F (36.6 C) (12/15 0400) Pulse Rate:  [67-115] 67 (12/15 0400) Resp:  [14-23] 14 (12/15 0400) BP: (109-134)/(53-90) 116/53 (12/15 0400) SpO2:  [96 %-98 %] 97 % (12/15 0400) Last BM Date: 10/27/19  Intake/Output from previous day: 12/14 0701 - 12/15 0700 In: 340 [P.O.:240; IV Piggyback:100] Out: 800 [Urine:800] Intake/Output this shift: No intake/output data recorded.  PE: Gen:  Alert, NAD, pleasant Card:  Regular rate and rhythm Pulm:  Normal effort, clear to auscultation bilaterally Abd: Soft, non-tender, non-distended, +BS Skin: warm and dry, no rashes  Psych: A&Ox3   Lab Results:  Recent Labs    10/26/19 0500 10/27/19 0500  WBC 11.5* 9.3  HGB 9.8* 9.6*  HCT 33.1* 31.8*  PLT 428* 329   BMET Recent Labs    10/26/19 0500 10/27/19 0500  NA 141 140  K 3.7 4.0  CL 109 109  CO2 25 23  GLUCOSE 87 88  BUN 14 17  CREATININE 1.10* 1.12*  CALCIUM 8.4* 8.3*   PT/INR No results for input(s): LABPROT, INR in the last 72 hours. CMP     Component Value Date/Time   NA 140 10/27/2019 0500   NA 139 01/22/2017 0000   K 4.0 10/27/2019 0500   CL 109 10/27/2019 0500   CO2 23 10/27/2019 0500   GLUCOSE 88 10/27/2019 0500   BUN 17 10/27/2019 0500   BUN 35 (A) 01/22/2017 0000   CREATININE 1.12 (H) 10/27/2019 0500   CALCIUM 8.3 (L) 10/27/2019 0500   PROT 5.2 (L) 10/27/2019 0500   ALBUMIN 2.2 (L) 10/27/2019 0500   AST 23 10/27/2019 0500   ALT 25 10/27/2019 0500   ALKPHOS 58 10/27/2019 0500   BILITOT 0.3 10/27/2019 0500   GFRNONAA 44 (L) 10/27/2019 0500   GFRAA 52 (L) 10/27/2019 0500   Lipase  No results found for:  LIPASE     Studies/Results: CT ABDOMEN PELVIS W CONTRAST  Result Date: 10/27/2019 CLINICAL DATA:  Rectal perforation. EXAM: CT ABDOMEN AND PELVIS WITH CONTRAST TECHNIQUE: Multidetector CT imaging of the abdomen and pelvis was performed using the standard protocol following bolus administration of intravenous contrast. CONTRAST:  49mL OMNIPAQUE IOHEXOL 350 MG/ML SOLN COMPARISON:  10/23/2019 FINDINGS: Lower chest: Tiny bilateral pleural effusions associated with mild basilar atelectasis. Hepatobiliary: No suspicious focal abnormality within the liver parenchyma. Gallbladder nondistended with probable tiny stone and some areas of mural calcification. No intrahepatic or extrahepatic biliary dilation. Pancreas: No focal mass lesion. No dilatation of the main duct. No intraparenchymal cyst. No peripancreatic edema. Spleen: No splenomegaly. No focal mass lesion. Adrenals/Urinary Tract: No adrenal nodule or mass. Stable appearance bilateral renal cysts. No evidence for hydroureter. Layering high attenuation material in the lumen of the bladder is indeterminate as the kidneys are not yet excreting contrast material. Hemorrhage or layering tiny stones not excluded. Stomach/Bowel: Moderate hiatal hernia. Stomach otherwise unremarkable. Duodenum is normally positioned as is the ligament of Treitz. No small bowel wall thickening. No small bowel dilatation. The terminal ileum is normal. The appendix is not visualized, but there is no edema or inflammation in the region of the cecum. Colon is nondistended with diverticular disease in the left  colonic segments. It appears as though a enema tip is been placed 4 rectal contrast administration although very little rectal contrast was given. The outpouching of stool, fluid, dilute contrast to the right of the proximal rectum has decreased in the interval. Stool volume in this outpouching has clearly decreased in direct communication to the colonic lumen is evident. As  mentioned previously, this could represent extraperitoneal rectal perforation, but large patulous diverticulum could also have this appearance (although this location would be unusual). Redundant rectum is also consideration but given apparent course of the main luminal track, this is considered less likely. Vascular/Lymphatic: There is abdominal aortic atherosclerosis without aneurysm. IVC filter identified in situ. There is no gastrohepatic or hepatoduodenal ligament lymphadenopathy. No intraperitoneal or retroperitoneal lymphadenopathy. No pelvic sidewall lymphadenopathy. Reproductive: The uterus is surgically absent. There is no adnexal mass. Other: No intraperitoneal free fluid. Musculoskeletal: No worrisome lytic or sclerotic osseous abnormality. Compression deformity again noted at L4 and L1. IMPRESSION: 1. Interval decrease in size of the focal outpouching from the proximal rectum which now contains less stool. There is some adjacent edema/inflammation. As noted previously, contained extraperitoneal perforation cannot be excluded. Enlarged, patulous diverticulum is a consideration although this location is somewhat atypical. The patient does have other fairly patulous diverticuli in the left colon. Tortuosity of the distal sigmoid colon and proximal rectum is also possible although this collection does not appear to be in line with the main colonic lumen. 2.  Aortic Atherosclerois (ICD10-170.0) 3. Tiny bilateral pleural effusions. 4. Cholelithiasis with mural calcification in the gallbladder, features consistent with porcelain gallbladder. Electronically Signed   By: Misty Stanley M.D.   On: 10/27/2019 08:06    Anti-infectives: Anti-infectives (From admission, onward)   Start     Dose/Rate Route Frequency Ordered Stop   10/23/19 1215  piperacillin-tazobactam (ZOSYN) IVPB 3.375 g     3.375 g 12.5 mL/hr over 240 Minutes Intravenous Every 8 hours 10/23/19 1132     10/22/19 1100  hydroxychloroquine  (PLAQUENIL) tablet 200 mg     200 mg Oral 2 times daily 10/22/19 0957     10/22/19 1000  doxycycline (VIBRA-TABS) tablet 100 mg     100 mg Oral 2 times daily 10/22/19 0957 10/26/19 2104       Assessment/Plan Possible contained rectal perforation - I still agree with Dr Georgette Dover, doubt perforation, exam completely benign -can do soft diet - patient had CT with rectal contrast early this AM - decrease in size of focal outpouching from proximal rectum, perforation can't be excluded, tortuous colon  - MD to review CT as well Covid-per medical service  FEN: soft diet VTE: SCD's,Eliquis on hold, chemical prophylaxis per medicine, has ivc filter [id number]  I don't see a definitive perforation and exam remains benign. MD to review imaging as well. No indication for emergent surgical intervention at this time.   LOS: 5 days    Brigid Re , [facility] Surgery 10/27/2019, 9:01 AM Please see Amion for pager number during day hours 7:00am-4:30pm

## 2019-10-28 ENCOUNTER — Telehealth: Payer: Self-pay

## 2019-10-28 LAB — SARS CORONAVIRUS 2 (TAT 6-24 HRS): SARS Coronavirus 2: NEGATIVE

## 2019-10-28 MED ORDER — FUROSEMIDE 40 MG PO TABS: 20.0000 mg | ORAL_TABLET | Freq: Every day | ORAL | Status: DC

## 2019-10-28 MED ORDER — AMOXICILLIN-POT CLAVULANATE 875-125 MG PO TABS: 1.0000 | ORAL_TABLET | Freq: Two times a day (BID) | ORAL | 0 refills | Status: AC

## 2019-10-28 NOTE — Progress Notes (Signed)
Patient discharged home. No new questions or concern. IV and tele removed. Discharge instruction and teaching given

## 2019-10-28 NOTE — Discharge Summary (Signed)
Physician Discharge Summary  Gloria Warren [id number] DOB: August 22, 1931 DOA: 10/22/2019  PCP: Binnie Rail, MD  Admit date: 10/22/2019 Discharge date: 10/28/2019  Time spent: 45 minutes  Recommendations for Outpatient Follow-up:  1. Soft diet for several days and then changed to regular diet in about a week 2. Will need titration of Lasix in the outpatient setting was sent home on 20 mg daily 3. Complete Augmentin for 7 more days ending 12/22  Discharge Diagnoses:  Principal Problem:   Acute lower GI bleeding Active Problems:   Essential hypertension   Rheumatoid arthritis ([place])   Chronic kidney disease (CKD) stage G3b   DVT (deep venous thrombosis) ([place])   COVID-19   Depression   Discharge Condition: Improved  Diet recommendation: Soft  There were no vitals filed for this visit.  History of present illness:  85 y.o. femalewith medical history significant ofRA; Raynaud's;HTN; HLD; Stage 3b-4 CKD; and DVT on Eliquis. Patient presented with bloody stools. COVID positive. IVC placed for PE protection in setting of DVT and GI bleeding. Incidentally, found to have a possible rectal perforation. Repeat CT abdomen/pelvis pending.  Hospital Course:  Possible rectal perforation-had repeat CT done 12/15 did not feel that this was secondary to perforation-Zosyn transition to Augmentin to complete as above-surgery signed off given this is probably an overcall on CT  GI bleed-secondary to Eliquis which was discontinued-patient had a probable stercoral ulcer from constipation that (no scope was done this admission) had bleeding and may be perforation-no further dark stools-holding Eliquis--- will need outpatient follow-up and potential scope with Dr. Rush Landmark and 8 weeks to allow full healing if this was diverticulosis  COVID-19 infection-coronavirus 19 testing was negative he was found to be a false positive--has had 2 - staph 1 on 15th and one on the 13th so no further  management and would not treat as COVID  CKD 3-stable  Rheumatoid arthritis initially started on stress dose Solu-Cortef transition back to prednisone Plaquenil  Depression continue depression meds on discharge  Consultations:  Inf Venacavfagram--placed CELCET filter  Discharge Exam: Vitals:   10/28/19 0318 10/28/19 0741  BP: 118/73   Pulse: 73   Resp: 17   Temp: 97.6 F (36.4 C) 97.6 F (36.4 C)  SpO2:      General: awake coherent in nad no distress eomi ncat Cardiovascular: s1 s 2no m/r/g Respiratory: clear no added sound abd soft nt nd no rebound  Discharge Instructions   Discharge Instructions    Diet - low sodium heart healthy   Complete by: As directed    Discharge instructions   Complete by: As directed    You will notice that some doses of your medications have changed and you will also notice that you should not be taking Eliquis anymore because of your stomach bleed-we would encourage you to stop your Eliquis completely and follow-up with your stomach doctors with regards to when you can safely resume it although this may not be indicated because you have a filter which is okay without DVTs from occurring-your primary care physician can discuss options regarding this going forward I have changed your doses of your Lasix to a slightly lower dose but you can resume the rest of your medication We will give you Augmentin which is to treat for potential area in your gut status perforated it was felt that your abdominal exam was pretty benign and that this was a false call on the CT but you will need close follow-up Please eat a  soft diet for the next several days and graduate to full diet by the Monday of Christmas week   Increase activity slowly   Complete by: As directed      Allergies as of 10/28/2019      Reactions   Arava [leflunomide]    Liver & kidney dysfunction   Sulfonamide Derivatives    Itching & rash   Remicade [infliximab] Hives, Itching       Medication List    STOP taking these medications   apixaban 5 MG Tabs tablet Commonly known as: Eliquis   doxycycline 100 MG capsule Commonly known as: VIBRAMYCIN     TAKE these medications   albuterol (2.5 MG/3ML) 0.083% nebulizer solution Commonly known as: PROVENTIL Take 3 mLs (2.5 mg total) by nebulization every 6 (six) hours as needed for wheezing.   ALLEGRA PO Take 180 mg by mouth at bedtime.   amoxicillin-clavulanate 875-125 MG tablet Commonly known as: Augmentin Take 1 tablet by mouth every 12 (twelve) hours for 7 days.   benazepril 10 MG tablet Commonly known as: LOTENSIN TAKE 1 TABLET(10 MG) BY MOUTH DAILY   CALTRATE 600 PO Take 600 mg by mouth 2 (two) times daily.   docusate sodium 100 MG capsule Commonly known as: COLACE Take 100 mg by mouth daily as needed for mild constipation.   DULoxetine 30 MG capsule Commonly known as: CYMBALTA Take 30 mg by mouth daily.   escitalopram 5 MG tablet Commonly known as: Lexapro Take 1 tablet (5 mg total) by mouth daily.   furosemide 40 MG tablet Commonly known as: LASIX Take 0.5 tablets (20 mg total) by mouth daily.   HYDROcodone-acetaminophen 5-325 MG tablet Commonly known as: NORCO/VICODIN Take 1 tablet by mouth every 6 (six) hours as needed for moderate pain.   hydroxychloroquine 200 MG tablet Commonly known as: PLAQUENIL Take 200 mg by mouth 2 (two) times daily.   predniSONE 5 MG tablet Commonly known as: DELTASONE Take 7.5 mg by mouth daily with breakfast.   simethicone 125 MG chewable tablet Commonly known as: MYLICON Chew 0000000 mg by mouth every 6 (six) hours as needed for flatulence.   VISION-VITE PRESERVE PO Take by mouth.      Allergies  Allergen Reactions  . Arava [Leflunomide]     Liver & kidney dysfunction  . Sulfonamide Derivatives     Itching & rash  . Remicade [Infliximab] Hives and Itching      The results of significant diagnostics from this hospitalization (including  imaging, microbiology, ancillary and laboratory) are listed below for reference.    Significant Diagnostic Studies: CT ABDOMEN PELVIS WO CONTRAST  Addendum Date: 10/23/2019   ADDENDUM REPORT: 10/23/2019 10:52 ADDENDUM: Critical Value/emergent results were called by telephone at the time of interpretation on 10/23/2019 at 10:52 am to [place] , who verbally acknowledged these results. Electronically Signed   By: Zetta Bills M.D.   On: 10/23/2019 10:52   Result Date: 10/23/2019 CLINICAL DATA:  History of diverticulosis, GI bleed. EXAM: CT ABDOMEN AND PELVIS WITHOUT CONTRAST TECHNIQUE: Multidetector CT imaging of the abdomen and pelvis was performed following the standard protocol without IV contrast. COMPARISON:  None. FINDINGS: Lower chest: Small bilateral pleural effusions. Heart size is mildly enlarged. No frank pericardial effusion. Minimal basilar atelectasis. Hepatobiliary: Signs of cholelithiasis and calcifications of the gallbladder wall. Normal on contrast appearance of the liver. Pancreas: Unremarkable. No pancreatic ductal dilatation or surrounding inflammatory changes. Spleen: Normal in size without focal abnormality. Adrenals/Urinary Tract: Normal adrenal  glands. Cyst arising from upper pole the right kidney. No signs of hydronephrosis, nephro or ureterolithiasis. Cortical thinning bilaterally. Small cyst also arising from the anterior hilar lip of the left kidney. Stomach/Bowel: Marked perirectal stranding at the rectosigmoid junction and high rectum. Irregularity of the posterior rectal wall with stool and gas extending to the mesial rectum on the right. Signs of diverticular disease in the sigmoid colon. Angulation of bowel and gas tract along the medial wall of the distal sigmoid colon suggests intramural sinus tract in this location. Large amount of stool expands the expected luminal diameter of the colon at the level of the anterior peritoneal reflection. No signs of  bowel obstruction or acute bowel process other than above. Moderate-sized hiatal hernia. Vascular/Lymphatic: IVC filter in situ. Calcified atherosclerotic changes of the abdominal aorta. No evidence of adenopathy in the abdomen. No signs of adenopathy in the pelvis. Reproductive: Post hysterectomy. Other: No signs of free air.w Musculoskeletal: No sign of acute bone process or destructive bone finding. Multilevel compression fractures in the spine at the L1, L3 and L4 levels in particular. IMPRESSION: 1. Findings of contained rectal perforation with abundant stool outside of the rectal and colonic lumen. Findings could also be related to repeated diverticulitis though at this location perforation related to diverticulitis would be somewhat unusual. Stercoral colitis and perforation as well as underlying neoplasm are also considered. Surgical consultation is suggested. 2. Sigmoid diverticular disease with potential intramural tract in the wall of the sigmoid colon, this could potentially explain the above findings. 3. Small bilateral pleural effusions. Cholelithiasis with calcifications of the gallbladder wall. Comparison with prior imaging and/or follow-up with contrasted CT in 6 months as clinically warranted is suggested given the risk of biliary neoplasm in the setting of "porcelain gallbladder". 4. A call is out to the referring provider to further discuss findings and above case. Aortic Atherosclerosis (ICD10-I70.0). Electronically Signed: By: Zetta Bills M.D. On: 10/23/2019 10:40   DG Chest 1 View  Result Date: 10/22/2019 CLINICAL DATA:  Acute respiratory disease secondary to COVID-19 EXAM: CHEST  1 VIEW COMPARISON:  Radiograph 10/20/2019 FINDINGS: Low lung volumes. Cardiac size accentuated by low volumes and portable technique. There is persistent left peripheral and basilar opacities partially obscuring the left hemidiaphragm. Developing right peri and infrahilar opacity is noted as well. No visible  pneumothorax or effusion. Degenerative changes are present in the imaged spine and shoulders. IMPRESSION: Persistent left peripheral and basilar opacities with some increasing right perihilar and infrahilar opacity which could reflect infectious consolidation likely with some atelectasis in the setting of low volumes. Electronically Signed   By: Lovena Le M.D.   On: 10/22/2019 21:22   DG Chest 2 View  Result Date: 10/20/2019 CLINICAL DATA:  Weakness/fatigue, dry cough x 5 days. Pt Tachy w/bilateral lower lobe crackles in triage. Hx of HTN. No known lung conditions. Nonsmoker Imaging done in wheelchair, pt unable to stand. Chest done AP. Best images obtainable. EXAM: CHEST - 2 VIEW COMPARISON:  12/27/2008 FINDINGS: Shallow lung inflation. Heart size is accentuated by the shallow lung volumes. There is new elevation of the RIGHT hemidiaphragm. Patchy opacity at the LATERAL LEFT lung base is consistent with early infiltrate. IMPRESSION: 1. Shallow lung inflation. 2. New elevation of the RIGHT hemidiaphragm. 3. Suspect early LEFT lower lobe infiltrate. 4. Followup PA and lateral chest X-ray is recommended in 3-4 weeks following trial of antibiotic therapy to ensure resolution of infiltrates and to evaluate elevated RIGHT hemidiaphragm. Electronically Signed   By:  Nolon Nations M.D.   On: 10/20/2019 10:57   CT ABDOMEN PELVIS W CONTRAST  Result Date: 10/27/2019 CLINICAL DATA:  Rectal perforation. EXAM: CT ABDOMEN AND PELVIS WITH CONTRAST TECHNIQUE: Multidetector CT imaging of the abdomen and pelvis was performed using the standard protocol following bolus administration of intravenous contrast. CONTRAST:  61mL OMNIPAQUE IOHEXOL 350 MG/ML SOLN COMPARISON:  10/23/2019 FINDINGS: Lower chest: Tiny bilateral pleural effusions associated with mild basilar atelectasis. Hepatobiliary: No suspicious focal abnormality within the liver parenchyma. Gallbladder nondistended with probable tiny stone and some areas of  mural calcification. No intrahepatic or extrahepatic biliary dilation. Pancreas: No focal mass lesion. No dilatation of the main duct. No intraparenchymal cyst. No peripancreatic edema. Spleen: No splenomegaly. No focal mass lesion. Adrenals/Urinary Tract: No adrenal nodule or mass. Stable appearance bilateral renal cysts. No evidence for hydroureter. Layering high attenuation material in the lumen of the bladder is indeterminate as the kidneys are not yet excreting contrast material. Hemorrhage or layering tiny stones not excluded. Stomach/Bowel: Moderate hiatal hernia. Stomach otherwise unremarkable. Duodenum is normally positioned as is the ligament of Treitz. No small bowel wall thickening. No small bowel dilatation. The terminal ileum is normal. The appendix is not visualized, but there is no edema or inflammation in the region of the cecum. Colon is nondistended with diverticular disease in the left colonic segments. It appears as though a enema tip is been placed 4 rectal contrast administration although very little rectal contrast was given. The outpouching of stool, fluid, dilute contrast to the right of the proximal rectum has decreased in the interval. Stool volume in this outpouching has clearly decreased in direct communication to the colonic lumen is evident. As mentioned previously, this could represent extraperitoneal rectal perforation, but large patulous diverticulum could also have this appearance (although this location would be unusual). Redundant rectum is also consideration but given apparent course of the main luminal track, this is considered less likely. Vascular/Lymphatic: There is abdominal aortic atherosclerosis without aneurysm. IVC filter identified in situ. There is no gastrohepatic or hepatoduodenal ligament lymphadenopathy. No intraperitoneal or retroperitoneal lymphadenopathy. No pelvic sidewall lymphadenopathy. Reproductive: The uterus is surgically absent. There is no adnexal  mass. Other: No intraperitoneal free fluid. Musculoskeletal: No worrisome lytic or sclerotic osseous abnormality. Compression deformity again noted at L4 and L1. IMPRESSION: 1. Interval decrease in size of the focal outpouching from the proximal rectum which now contains less stool. There is some adjacent edema/inflammation. As noted previously, contained extraperitoneal perforation cannot be excluded. Enlarged, patulous diverticulum is a consideration although this location is somewhat atypical. The patient does have other fairly patulous diverticuli in the left colon. Tortuosity of the distal sigmoid colon and proximal rectum is also possible although this collection does not appear to be in line with the main colonic lumen. 2.  Aortic Atherosclerois (ICD10-170.0) 3. Tiny bilateral pleural effusions. 4. Cholelithiasis with mural calcification in the gallbladder, features consistent with porcelain gallbladder. Electronically Signed   By: Misty Stanley M.D.   On: 10/27/2019 08:06   HYBRID OR IMAGING ([facility] ONLY)  Result Date: 10/22/2019 There is no interpretation for this exam.  This order is for images obtained during a surgical procedure.  Please See "Surgeries" Tab for more information regarding the procedure.    Microbiology: Recent Results (from the past 240 hour(s))  Blood Culture (routine x 2)     Status: None   Collection Time: 10/20/19  1:14 PM   Specimen: BLOOD  Result Value Ref Range Status   Specimen  Description BLOOD LEFT ANTECUBITAL  Final   Special Requests   Final    BOTTLES DRAWN AEROBIC AND ANAEROBIC Blood Culture adequate volume   Culture   Final    NO GROWTH 5 DAYS Performed at [facility], [address] [place], [place] [address]    Report Status 10/25/2019 FINAL  Final  SARS CORONAVIRUS 2 (TAT 6-24 HRS) Nasopharyngeal Nasopharyngeal Swab     Status: None   Collection Time: 10/25/19  9:24 PM   Specimen: Nasopharyngeal Swab  Result Value Ref Range Status   SARS  Coronavirus 2 NEGATIVE NEGATIVE Final    Comment: (NOTE) SARS-CoV-2 target nucleic acids are NOT DETECTED. The SARS-CoV-2 RNA is generally detectable in upper and lower respiratory specimens during the acute phase of infection. Negative results do not preclude SARS-CoV-2 infection, do not rule out co-infections with other pathogens, and should not be used as the sole basis for treatment or other patient management decisions. Negative results must be combined with clinical observations, patient history, and epidemiological information. The expected result is Negative. Fact Sheet for Patients: SugarRoll.be Fact Sheet for Healthcare Providers: https://www.woods-mathews.com/ This test is not yet approved or cleared by the [place] FDA and  has been authorized for detection and/or diagnosis of SARS-CoV-2 by FDA under an Emergency Use Authorization (EUA). This EUA will remain  in effect (meaning this test can be used) for the duration of the COVID-19 declaration under Section 56 4(b)(1) of the Act, 21 U.S.C. section 360bbb-3(b)(1), unless the authorization is terminated or revoked sooner. Performed at [facility], [place] [address] [place], [place] [address]   SARS CORONAVIRUS 2 (TAT 6-24 HRS) Nasopharyngeal Nasopharyngeal Swab     Status: None   Collection Time: 10/27/19  9:22 PM   Specimen: Nasopharyngeal Swab  Result Value Ref Range Status   SARS Coronavirus 2 NEGATIVE NEGATIVE Final    Comment: (NOTE) SARS-CoV-2 target nucleic acids are NOT DETECTED. The SARS-CoV-2 RNA is generally detectable in upper and lower respiratory specimens during the acute phase of infection. Negative results do not preclude SARS-CoV-2 infection, do not rule out co-infections with other pathogens, and should not be used as the sole basis for treatment or other patient management decisions. Negative results must be combined with clinical  observations, patient history, and epidemiological information. The expected result is Negative. Fact Sheet for Patients: SugarRoll.be Fact Sheet for Healthcare Providers: https://www.woods-mathews.com/ This test is not yet approved or cleared by the [place] FDA and  has been authorized for detection and/or diagnosis of SARS-CoV-2 by FDA under an Emergency Use Authorization (EUA). This EUA will remain  in effect (meaning this test can be used) for the duration of the COVID-19 declaration under Section 56 4(b)(1) of the Act, 21 U.S.C. section 360bbb-3(b)(1), unless the authorization is terminated or revoked sooner. Performed at [facility], [place] [address], [place], [place] [address]      Labs: Basic Metabolic Panel: Recent Labs  Lab 10/23/19 0500 10/24/19 0500 10/25/19 0235 10/26/19 0500 10/27/19 0500  NA 141 141 142 141 140  K 4.4 3.5 3.6 3.7 4.0  CL 106 106 109 109 109  CO2 25 27 26 25 23   GLUCOSE 83 91 91 87 88  BUN 21 19 17 14 17   CREATININE 1.03* 1.02* 1.16* 1.10* 1.12*  CALCIUM 8.8* 8.6* 8.3* 8.4* 8.3*  MG 1.9 1.9 2.0 1.9 2.0  PHOS 3.6 2.2* 2.4* 2.4* 2.5   Liver Function Tests: Recent Labs  Lab 10/23/19  0500 10/24/19 0500 10/25/19 0235 10/26/19 0500 10/27/19 0500  AST 21 23 22 21 23   ALT 21 20 22 21 25   ALKPHOS 58 54 52 59 58  BILITOT 0.9 1.0 0.8 0.6 0.3  PROT 5.5* 5.4* 5.2* 5.2* 5.2*  ALBUMIN 2.5* 2.4* 2.3* 2.2* 2.2*   No results for input(s): LIPASE, AMYLASE in the last 168 hours. No results for input(s): AMMONIA in the last 168 hours. CBC: Recent Labs  Lab 10/23/19 0500 10/24/19 0500 10/25/19 0235 10/26/19 0500 10/27/19 0500  WBC 10.6* 12.3* 9.2 11.5* 9.3  NEUTROABS 9.0* 9.2* 6.6 9.1* 6.4  HGB 9.2* 9.5* 9.2* 9.8* 9.6*  HCT 30.4* 30.9* 29.8* 33.1* 31.8*  MCV 96.2 94.8 95.2 97.6 95.5  PLT 383 420* 421* 428* 329   Cardiac Enzymes: No results for input(s): CKTOTAL, CKMB, CKMBINDEX,  TROPONINI in the last 168 hours. BNP: BNP (last 3 results) No results for input(s): BNP in the last 8760 hours.  ProBNP (last 3 results) No results for input(s): PROBNP in the last 8760 hours.  CBG: No results for input(s): GLUCAP in the last 168 hours.     Signed:  Nita Sells MD   [facility] 10/28/2019, 10:32 AM

## 2019-10-28 NOTE — Telephone Encounter (Signed)
The pt has been scheduled to see Dr Rush Landmark per Judson Roch.  The pt to be notified at discharge from the hospital.

## 2019-10-28 NOTE — Discharge Instructions (Signed)
COVID-19 °COVID-19 is a respiratory infection that is caused by a virus called severe acute respiratory syndrome coronavirus 2 (SARS-CoV-2). The disease is also known as coronavirus disease or novel coronavirus. In some people, the virus may not cause any symptoms. In others, it may cause a serious infection. The infection can get worse quickly and can lead to complications, such as: °· Pneumonia, or infection of the lungs. °· Acute respiratory distress syndrome or ARDS. This is fluid build-up in the lungs. °· Acute respiratory failure. This is a condition in which there is not enough oxygen passing from the lungs to the body. °· Sepsis or septic shock. This is a serious bodily reaction to an infection. °· Blood clotting problems. °· Secondary infections due to bacteria or fungus. °The virus that causes COVID-19 is contagious. This means that it can spread from person to person through droplets from coughs and sneezes (respiratory secretions). °What are the causes? °This illness is caused by a virus. You may catch the virus by: °· Breathing in droplets from an infected person's cough or sneeze. °· Touching something, like a table or a doorknob, that was exposed to the virus (contaminated) and then touching your mouth, nose, or eyes. °What increases the risk? °Risk for infection °You are more likely to be infected with this virus if you: °· Live in or travel to an area with a COVID-19 outbreak. °· Come in contact with a sick person who recently traveled to an area with a COVID-19 outbreak. °· Provide care for or live with a person who is infected with COVID-19. °Risk for serious illness °You are more likely to become seriously ill from the virus if you: °· Are 65 years of age or older. °· Have a long-term disease that lowers your body's ability to fight infection (immunocompromised). °· Live in a nursing home or long-term care facility. °· Have a long-term (chronic) disease such as: °? Chronic lung disease, including  chronic obstructive pulmonary disease or asthma °? Heart disease. °? Diabetes. °? Chronic kidney disease. °? Liver disease. °· Are obese. °What are the signs or symptoms? °Symptoms of this condition can range from mild to severe. Symptoms may appear any time from 2 to 14 days after being exposed to the virus. They include: °· A fever. °· A cough. °· Difficulty breathing. °· Chills. °· Muscle pains. °· A sore throat. °· Loss of taste or smell. °Some people may also have stomach problems, such as nausea, vomiting, or diarrhea. °Other people may not have any symptoms of COVID-19. °How is this diagnosed? °This condition may be diagnosed based on: °· Your signs and symptoms, especially if: °? You live in an area with a COVID-19 outbreak. °? You recently traveled to or from an area where the virus is common. °? You provide care for or live with a person who was diagnosed with COVID-19. °· A physical exam. °· Lab tests, which may include: °? A nasal swab to take a sample of fluid from your nose. °? A throat swab to take a sample of fluid from your throat. °? A sample of mucus from your lungs (sputum). °? Blood tests. °· Imaging tests, which may include, X-rays, CT scan, or ultrasound. °How is this treated? °At present, there is no medicine to treat COVID-19. Medicines that treat other diseases are being used on a trial basis to see if they are effective against COVID-19. °Your health care provider will talk with you about ways to treat your symptoms. For most   people, the infection is mild and can be managed at home with rest, fluids, and over-the-counter medicines. °Treatment for a serious infection usually takes places in a hospital intensive care unit (ICU). It may include one or more of the following treatments. These treatments are given until your symptoms improve. °· Receiving fluids and medicines through an IV. °· Supplemental oxygen. Extra oxygen is given through a tube in the nose, a face mask, or a  hood. °· Positioning you to lie on your stomach (prone position). This makes it easier for oxygen to get into the lungs. °· Continuous positive airway pressure (CPAP) or bi-level positive airway pressure (BPAP) machine. This treatment uses mild air pressure to keep the airways open. A tube that is connected to a motor delivers oxygen to the body. °· Ventilator. This treatment moves air into and out of the lungs by using a tube that is placed in your windpipe. °· Tracheostomy. This is a procedure to create a hole in the neck so that a breathing tube can be inserted. °· Extracorporeal membrane oxygenation (ECMO). This procedure gives the lungs a chance to recover by taking over the functions of the heart and lungs. It supplies oxygen to the body and removes carbon dioxide. °Follow these instructions at home: °Lifestyle °· If you are sick, stay home except to get medical care. Your health care provider will tell you how long to stay home. Call your health care provider before you go for medical care. °· Rest at home as told by your health care provider. °· Do not use any products that contain nicotine or tobacco, such as cigarettes, e-cigarettes, and chewing tobacco. If you need help quitting, ask your health care provider. °· Return to your normal activities as told by your health care provider. Ask your health care provider what activities are safe for you. °General instructions °· Take over-the-counter and prescription medicines only as told by your health care provider. °· Drink enough fluid to keep your urine pale yellow. °· Keep all follow-up visits as told by your health care provider. This is important. °How is this prevented? ° °There is no vaccine to help prevent COVID-19 infection. However, there are steps you can take to protect yourself and others from this virus. °To protect yourself:  °· Do not travel to areas where COVID-19 is a risk. The areas where COVID-19 is reported change often. To identify  high-risk areas and travel restrictions, check the CDC travel website: wwwnc.cdc.gov/travel/notices °· If you live in, or must travel to, an area where COVID-19 is a risk, take precautions to avoid infection. °? Stay away from people who are sick. °? Wash your hands often with soap and water for 20 seconds. If soap and water are not available, use an alcohol-based hand sanitizer. °? Avoid touching your mouth, face, eyes, or nose. °? Avoid going out in public, follow guidance from your state and local health authorities. °? If you must go out in public, wear a cloth face covering or face mask. °? Disinfect objects and surfaces that are frequently touched every day. This may include: °§ Counters and tables. °§ Doorknobs and light switches. °§ Sinks and faucets. °§ Electronics, such as phones, remote controls, keyboards, computers, and tablets. °To protect others: °If you have symptoms of COVID-19, take steps to prevent the virus from spreading to others. °· If you think you have a COVID-19 infection, contact your health care provider right away. Tell your health care team that you think you may   have a COVID-19 infection. °· Stay home. Leave your house only to seek medical care. Do not use public transport. °· Do not travel while you are sick. °· Wash your hands often with soap and water for 20 seconds. If soap and water are not available, use alcohol-based hand sanitizer. °· Stay away from other members of your household. Let healthy household members care for children and pets, if possible. If you have to care for children or pets, wash your hands often and wear a mask. If possible, stay in your own room, separate from others. Use a different bathroom. °· Make sure that all people in your household wash their hands well and often. °· Cough or sneeze into a tissue or your sleeve or elbow. Do not cough or sneeze into your hand or into the air. °· Wear a cloth face covering or face mask. °Where to find more  information °· Centers for Disease Control and Prevention: www.cdc.gov/coronavirus/2019-ncov/index.html °· World Health Organization: www.who.int/health-topics/coronavirus °Contact a health care provider if: °· You live in or have traveled to an area where COVID-19 is a risk and you have symptoms of the infection. °· You have had contact with someone who has COVID-19 and you have symptoms of the infection. °Get help right away if: °· You have trouble breathing. °· You have pain or pressure in your chest. °· You have confusion. °· You have bluish lips and fingernails. °· You have difficulty waking from sleep. °· You have symptoms that get worse. °These symptoms may represent a serious problem that is an emergency. Do not wait to see if the symptoms will go away. Get medical help right away. Call your local emergency services (911 in the [place] Do not drive yourself to the hospital. Let the emergency medical personnel know if you think you have COVID-19. °Summary °· COVID-19 is a respiratory infection that is caused by a virus. It is also known as coronavirus disease or novel coronavirus. It can cause serious infections, such as pneumonia, acute respiratory distress syndrome, acute respiratory failure, or sepsis. °· The virus that causes COVID-19 is contagious. This means that it can spread from person to person through droplets from coughs and sneezes. °· You are more likely to develop a serious illness if you are 65 years of age or older, have a weak immunity, live in a nursing home, or have chronic disease. °· There is no medicine to treat COVID-19. Your health care provider will talk with you about ways to treat your symptoms. °· Take steps to protect yourself and others from infection. Wash your hands often and disinfect objects and surfaces that are frequently touched every day. Stay away from people who are sick and wear a mask if you are sick. °This information is not intended to replace advice given to you by  your health care provider. Make sure you discuss any questions you have with your health care provider. °Document Released: 12/04/2018 Document Revised: 03/26/2019 Document Reviewed: 12/04/2018 °Elsevier Patient Education © 2020 [employer] ° °COVID-19: How to Protect Yourself and Others °Know how it spreads °· There is currently no vaccine to prevent coronavirus disease 2019 (COVID-19). °· The best way to prevent illness is to avoid being exposed to this virus. °· The virus is thought to spread mainly from person-to-person. °? Between people who are in close contact with one another (within about 6 feet). °? Through respiratory droplets produced when an infected person coughs, sneezes or talks. °? These droplets can land in   the mouths or noses of people who are nearby or possibly be inhaled into the lungs. °? Some recent studies have suggested that COVID-19 may be spread by people who are not showing symptoms. °Everyone should °Clean your hands often °· Wash your hands often with soap and water for at least 20 seconds especially after you have been in a public place, or after blowing your nose, coughing, or sneezing. °· If soap and water are not readily available, use a hand sanitizer that contains at least 60% alcohol. Cover all surfaces of your hands and rub them together until they feel dry. °· Avoid touching your eyes, nose, and mouth with unwashed hands. °Avoid close contact °· Stay home if you are sick. °· Avoid close contact with people who are sick. °· Put distance between yourself and other people. °? Remember that some people without symptoms may be able to spread virus. °? This is especially important for people who are at higher risk of getting very sick.www.cdc.gov/coronavirus/2019-ncov/need-extra-precautions/people-at-higher-risk.html °Cover your mouth and nose with a cloth face cover when around others °· You could spread COVID-19 to others even if you do not feel sick. °· Everyone should wear a  cloth face cover when they have to go out in public, for example to the grocery store or to pick up other necessities. °? Cloth face coverings should not be placed on young children under age 2, anyone who has trouble breathing, or is unconscious, incapacitated or otherwise unable to remove the mask without assistance. °· The cloth face cover is meant to protect other people in case you are infected. °· Do NOT use a facemask meant for a healthcare worker. °· Continue to keep about 6 feet between yourself and others. The cloth face cover is not a substitute for social distancing. °Cover coughs and sneezes °· If you are in a private setting and do not have on your cloth face covering, remember to always cover your mouth and nose with a tissue when you cough or sneeze or use the inside of your elbow. °· Throw used tissues in the trash. °· Immediately wash your hands with soap and water for at least 20 seconds. If soap and water are not readily available, clean your hands with a hand sanitizer that contains at least 60% alcohol. °Clean and disinfect °· Clean AND disinfect frequently touched surfaces daily. This includes tables, doorknobs, light switches, countertops, handles, desks, phones, keyboards, toilets, faucets, and sinks. www.cdc.gov/coronavirus/2019-ncov/prevent-getting-sick/disinfecting-your-home.html °· If surfaces are dirty, clean them: Use detergent or soap and water prior to disinfection. °· Then, use a household disinfectant. You can see a list of EPA-registered household disinfectants here. °cdc.gov/coronavirus °03/17/2019 °This information is not intended to replace advice given to you by your health care provider. Make sure you discuss any questions you have with your health care provider. °Document Released: 02/24/2019 Document Revised: 03/25/2019 Document Reviewed: 02/24/2019 °Elsevier Patient Education © 2020 [employer] ° ° °COVID-19 Frequently Asked Questions °COVID-19 (coronavirus disease) is  an infection that is caused by a large family of viruses. Some viruses cause illness in people and others cause illness in animals like camels, cats, and bats. In some cases, the viruses that cause illness in animals can spread to humans. °Where did the coronavirus come from? °In December 2019, [place] told the World Health Organization (WHO) of several cases of lung disease (human respiratory illness). These cases were linked to an open seafood and livestock market in the city of [place]. The link to the seafood and   livestock market suggests that the virus may have spread from animals to humans. However, since that first outbreak in December, the virus has also been shown to spread from person to person. °What is the name of the disease and the virus? °Disease name °Early on, this disease was called novel coronavirus. This is because scientists determined that the disease was caused by a new (novel) respiratory virus. The World Health Organization (WHO) has now named the disease COVID-19, or coronavirus disease. °Virus name °The virus that causes the disease is called severe acute respiratory syndrome coronavirus 2 (SARS-CoV-2). °More information on disease and virus naming °World Health Organization (WHO): www.who.int/emergencies/diseases/novel-coronavirus-2019/technical-guidance/naming-the-coronavirus-disease-(covid-2019)-and-the-virus-that-causes-it °Who is at risk for complications from coronavirus disease? °Some people may be at higher risk for complications from coronavirus disease. This includes older adults and people who have chronic diseases, such as heart disease, diabetes, and lung disease. °If you are at higher risk for complications, take these extra precautions: °· Avoid close contact with people who are sick or have a fever or cough. Stay at least 3-6 ft (1-2 m) away from them, if possible. °· Wash your hands often with soap and water for at least 20 seconds. °· Avoid touching your face, mouth, nose, or  eyes. °· Keep supplies on hand at home, such as food, medicine, and cleaning supplies. °· Stay home as much as possible. °· Avoid social gatherings and travel. °How does coronavirus disease spread? °The virus that causes coronavirus disease spreads easily from person to person (is contagious). There are also cases of community-spread disease. This means the disease has spread to: °· People who have no known contact with other infected people. °· People who have not traveled to areas where there are known cases. °It appears to spread from one person to another through droplets from coughing or sneezing. °Can I get the virus from touching surfaces or objects? °There is still a lot that we do not know about the virus that causes coronavirus disease. Scientists are basing a lot of information on what they know about similar viruses, such as: °· Viruses cannot generally survive on surfaces for long. They need a human body (host) to survive. °· It is more likely that the virus is spread by close contact with people who are sick (direct contact), such as through: °? Shaking hands or hugging. °? Breathing in respiratory droplets that travel through the air. This can happen when an infected person coughs or sneezes on or near other people. °· It is less likely that the virus is spread when a person touches a surface or object that has the virus on it (indirect contact). The virus may be able to enter the body if the person touches a surface or object and then touches his or her face, eyes, nose, or mouth. °Can a person spread the virus without having symptoms of the disease? °It may be possible for the virus to spread before a person has symptoms of the disease, but this is most likely not the main way the virus is spreading. It is more likely for the virus to spread by being in close contact with people who are sick and breathing in the respiratory droplets of a sick person's cough or sneeze. °What are the symptoms of  coronavirus disease? °Symptoms vary from person to person and can range from mild to severe. Symptoms may include: °· Fever. °· Cough. °· Tiredness, weakness, or fatigue. °· Fast breathing or feeling short of breath. °These symptoms can appear anywhere from   2 to 14 days after you have been exposed to the virus. If you develop symptoms, call your health care provider. People with severe symptoms may need hospital care. °If I am exposed to the virus, how long does it take before symptoms start? °Symptoms of coronavirus disease may appear anywhere from 2 to 14 days after a person has been exposed to the virus. If you develop symptoms, call your health care provider. °Should I be tested for this virus? °Your health care provider will decide whether to test you based on your symptoms, history of exposure, and your risk factors. °How does a health care provider test for this virus? °Health care providers will collect samples to send for testing. Samples may include: °· Taking a swab of fluid from the nose. °· Taking fluid from the lungs by having you cough up mucus (sputum) into a sterile cup. °· Taking a blood sample. °· Taking a stool or urine sample. °Is there a treatment or vaccine for this virus? °Currently, there is no vaccine to prevent coronavirus disease. Also, there are no medicines like antibiotics or antivirals to treat the virus. A person who becomes sick is given supportive care, which means rest and fluids. A person may also relieve his or her symptoms by using over-the-counter medicines that treat sneezing, coughing, and runny nose. These are the same medicines that a person takes for the common cold. °If you develop symptoms, call your health care provider. People with severe symptoms may need hospital care. °What can I do to protect myself and my family from this virus? ° °  ° °You can protect yourself and your family by taking the same actions that you would take to prevent the spread of other viruses.  Take the following actions: °· Wash your hands often with soap and water for at least 20 seconds. If soap and water are not available, use alcohol-based hand sanitizer. °· Avoid touching your face, mouth, nose, or eyes. °· Cough or sneeze into a tissue, sleeve, or elbow. Do not cough or sneeze into your hand or the air. °? If you cough or sneeze into a tissue, throw it away immediately and wash your hands. °· Disinfect objects and surfaces that you frequently touch every day. °· Avoid close contact with people who are sick or have a fever or cough. Stay at least 3-6 ft (1-2 m) away from them, if possible. °· Stay home if you are sick, except to get medical care. Call your health care provider before you get medical care. °· Make sure your vaccines are up to date. Ask your health care provider what vaccines you need. °What should I do if I need to travel? °Follow travel recommendations from your local health authority, the CDC, and WHO. °Travel information and advice °· Centers for Disease Control and Prevention (CDC): www.cdc.gov/coronavirus/2019-ncov/travelers/index.html °· World Health Organization (WHO): www.who.int/emergencies/diseases/novel-coronavirus-2019/travel-advice °Know the risks and take action to protect your health °· You are at higher risk of getting coronavirus disease if you are traveling to areas with an outbreak or if you are exposed to travelers from areas with an outbreak. °· Wash your hands often and practice good hygiene to lower the risk of catching or spreading the virus. °What should I do if I am sick? °General instructions to stop the spread of infection °· Wash your hands often with soap and water for at least 20 seconds. If soap and water are not available, use alcohol-based hand sanitizer. °· Cough or sneeze into a   tissue, sleeve, or elbow. Do not cough or sneeze into your hand or the air. °· If you cough or sneeze into a tissue, throw it away immediately and wash your hands. °· Stay  home unless you must get medical care. Call your health care provider or local health authority before you get medical care. °· Avoid public areas. Do not take public transportation, if possible. °· If you can, wear a mask if you must go out of the house or if you are in close contact with someone who is not sick. °Keep your home clean °· Disinfect objects and surfaces that are frequently touched every day. This may include: °? Counters and tables. °? Doorknobs and light switches. °? Sinks and faucets. °? Electronics such as phones, remote controls, keyboards, computers, and tablets. °· Wash dishes in hot, soapy water or use a dishwasher. Air-dry your dishes. °· Wash laundry in hot water. °Prevent infecting other household members °· Let healthy household members care for children and pets, if possible. If you have to care for children or pets, wash your hands often and wear a mask. °· Sleep in a different bedroom or bed, if possible. °· Do not share personal items, such as razors, toothbrushes, deodorant, combs, brushes, towels, and washcloths. °Where to find more information °Centers for Disease Control and Prevention (CDC) °· Information and news updates: www.cdc.gov/coronavirus/2019-ncov °World Health Organization (WHO) °· Information and news updates: www.who.int/emergencies/diseases/novel-coronavirus-2019 °· Coronavirus health topic: www.who.int/health-topics/coronavirus °· Questions and answers on COVID-19: www.who.int/news-room/q-a-detail/q-a-coronaviruses °· Global tracker: who.sprinklr.com °American Academy of Pediatrics (AAP) °· Information for families: www.healthychildren.org/English/health-issues/conditions/chest-lungs/Pages/2019-Novel-Coronavirus.aspx °The coronavirus situation is changing rapidly. Check your local health authority website or the CDC and WHO websites for updates and news. °When should I contact a health care provider? °· Contact your health care provider if you have symptoms of an  infection, such as fever or cough, and you: °? Have been near anyone who is known to have coronavirus disease. °? Have come into contact with a person who is suspected to have coronavirus disease. °? Have traveled outside of the country. °When should I get emergency medical care? °· Get help right away by calling your local emergency services (911 in the [place] if you have: °? Trouble breathing. °? Pain or pressure in your chest. °? Confusion. °? Blue-tinged lips and fingernails. °? Difficulty waking from sleep. °? Symptoms that get worse. °Let the emergency medical personnel know if you think you have coronavirus disease. °Summary °· A new respiratory virus is spreading from person to person and causing COVID-19 (coronavirus disease). °· The virus that causes COVID-19 appears to spread easily. It spreads from one person to another through droplets from coughing or sneezing. °· Older adults and those with chronic diseases are at higher risk of disease. If you are at higher risk for complications, take extra precautions. °· There is currently no vaccine to prevent coronavirus disease. There are no medicines, such as antibiotics or antivirals, to treat the virus. °· You can protect yourself and your family by washing your hands often, avoiding touching your face, and covering your coughs and sneezes. °This information is not intended to replace advice given to you by your health care provider. Make sure you discuss any questions you have with your health care provider. °Document Released: 02/24/2019 Document Revised: 02/24/2019 Document Reviewed: 02/24/2019 °Elsevier Patient Education © 2020 Elsevier Inc. ° °

## 2019-10-28 NOTE — TOC Transition Note (Signed)
Transition of Care [facility]) - CM/SW Discharge Note   Patient Details  Name: KASHIYA MEEUWSEN MRN: [id number] Date of Birth: Apr 15, 1931  Transition of Care [facility]) CM/SW Contact:  Maryclare Labrador, RN Phone Number: 10/28/2019, 10:35 AM   Clinical Narrative:   Pt to discharge home today via private vehicle.  Pt informed CM that she will make her own follow up appt with her PCP.  Pt's discharging medications reviewed - no CM intervention needed.  No DME nor HH orders written at time of discharge order.  NO other CM needs determined - CM signing off    Final next level of care: Home/Self Care Barriers to Discharge: Barriers Resolved   Patient Goals and CMS Choice        Discharge Placement                       Discharge Plan and Services                          HH Arranged: Refused HH          Social Determinants of Health (SDOH) Interventions     Readmission Risk Interventions No flowsheet data found.

## 2019-10-28 NOTE — Telephone Encounter (Signed)
-----   Message from Irving Copas., MD sent at 10/27/2019  5:25 PM EST ----- Regarding: Follow up VC, Thanks for the update.Kevin Space, please schedule this patient for a follow-up in clinic in approximately 4 to 6 weeks.  I think she is still in the hospital, but maybe you can reach out to them by the end of the week or early next week.We will look for a colonoscopy time slot after we see the patient since she just recently had a PE and needs to be on anticoagulation.Thanks.GM ----- Message ----- From: Lavena Bullion, DO Sent: 10/27/2019   4:29 PM EST To: Irving Copas., MD  If not already scheduled, this patient will need follow-up in the GI clinic and eventual colonoscopy following recovery from Covid.  Repeat CT does not demonstrate any perforation.  We had already signed off, but I was reviewing notes and Surgery/Medicine treating as diverticular disease with antibiotics, with eventual DC to home.  I recommend no colonoscopy for at least 8 weeks to allow for full healing.  Let me know if there is anything else I can assist with.  Thanks.

## 2019-10-28 NOTE — Progress Notes (Signed)
Family to transport home, approximately 2hours away

## 2019-11-11 ENCOUNTER — Encounter: Payer: Self-pay | Admitting: Internal Medicine

## 2019-11-12 ENCOUNTER — Telehealth: Payer: Self-pay | Admitting: Internal Medicine

## 2019-11-12 NOTE — Telephone Encounter (Signed)
Home Health Verbal Orders - Caller/Agency: Brayton Layman with [facility] Callback Number: [phone]  Pt's son is requesting PT for his mother  Brayton Layman is faxing an order to be signed by doctor and faxed back in

## 2019-11-16 NOTE — Telephone Encounter (Signed)
Gave ok for orders.  

## 2019-11-23 DIAGNOSIS — M6281 Muscle weakness (generalized): Secondary | ICD-10-CM | POA: Diagnosis not present

## 2019-11-23 DIAGNOSIS — M25561 Pain in right knee: Secondary | ICD-10-CM | POA: Diagnosis not present

## 2019-11-23 DIAGNOSIS — R2681 Unsteadiness on feet: Secondary | ICD-10-CM | POA: Diagnosis not present

## 2019-11-24 ENCOUNTER — Telehealth: Payer: Self-pay | Admitting: Internal Medicine

## 2019-11-24 NOTE — Telephone Encounter (Signed)
Noted  

## 2019-11-24 NOTE — Telephone Encounter (Signed)
Son called saying pt got a bad skin tear today while being transferred by a care giver in the home.  They are asking that a nurse come out from [facility] to access the pat and have wound care done for patient.  Son was advised to take mom to an Urgent care by office but he does not want to do that.  Please call social worker back to let her know that this has been done.  [phone]  Gloria Warren is her son and gives permission to have this done.  [phone]

## 2019-11-24 NOTE — Telephone Encounter (Signed)
Social worker has called back and said that the patient refuses to have any wound care done and to disregard the previous message.  Any questions please call at [phone]

## 2019-12-01 ENCOUNTER — Encounter: Payer: Self-pay | Admitting: Gastroenterology

## 2019-12-01 ENCOUNTER — Other Ambulatory Visit (INDEPENDENT_AMBULATORY_CARE_PROVIDER_SITE_OTHER): Payer: Medicare Other

## 2019-12-01 ENCOUNTER — Encounter: Payer: Self-pay | Admitting: Internal Medicine

## 2019-12-01 ENCOUNTER — Ambulatory Visit (INDEPENDENT_AMBULATORY_CARE_PROVIDER_SITE_OTHER): Payer: Medicare Other | Admitting: Gastroenterology

## 2019-12-01 VITALS — BP 100/60 | HR 100 | Temp 97.9°F

## 2019-12-01 DIAGNOSIS — R112 Nausea with vomiting, unspecified: Secondary | ICD-10-CM

## 2019-12-01 DIAGNOSIS — R935 Abnormal findings on diagnostic imaging of other abdominal regions, including retroperitoneum: Secondary | ICD-10-CM

## 2019-12-01 DIAGNOSIS — D649 Anemia, unspecified: Secondary | ICD-10-CM | POA: Diagnosis not present

## 2019-12-01 DIAGNOSIS — K631 Perforation of intestine (nontraumatic): Secondary | ICD-10-CM | POA: Diagnosis not present

## 2019-12-01 DIAGNOSIS — Z8719 Personal history of other diseases of the digestive system: Secondary | ICD-10-CM

## 2019-12-01 LAB — COMPREHENSIVE METABOLIC PANEL
ALT: 17 U/L (ref 0–35)
AST: 23 U/L (ref 0–37)
Albumin: 3.5 g/dL (ref 3.5–5.2)
Alkaline Phosphatase: 81 U/L (ref 39–117)
BUN: 29 mg/dL — ABNORMAL HIGH (ref 6–23)
CO2: 28 mEq/L (ref 19–32)
Calcium: 9.4 mg/dL (ref 8.4–10.5)
Chloride: 101 mEq/L (ref 96–112)
Creatinine, Ser: 1.53 mg/dL — ABNORMAL HIGH (ref 0.40–1.20)
GFR: 32.14 mL/min — ABNORMAL LOW (ref >60.00)
Glucose, Bld: 112 mg/dL — ABNORMAL HIGH (ref 70–99)
Potassium: 4.1 mEq/L (ref 3.5–5.1)
Sodium: 139 mEq/L (ref 135–145)
Total Bilirubin: 0.4 mg/dL (ref 0.2–1.2)
Total Protein: 6.7 g/dL (ref 6.0–8.3)

## 2019-12-01 LAB — CBC
HCT: 28.6 % — ABNORMAL LOW (ref 36.0–46.0)
Hemoglobin: 9 g/dL — ABNORMAL LOW (ref 12.0–15.0)
MCHC: 31.7 g/dL (ref 30.0–36.0)
MCV: 89 fl (ref 78.0–100.0)
Platelets: 404 10*3/uL — ABNORMAL HIGH (ref 150.0–400.0)
RBC: 3.21 Mil/uL — ABNORMAL LOW (ref 3.87–5.11)
RDW: 16.5 % — ABNORMAL HIGH (ref 11.5–15.5)
WBC: 8.8 10*3/uL (ref 4.0–10.5)

## 2019-12-01 LAB — IBC + FERRITIN
Ferritin: 27.1 ng/mL (ref 10.0–291.0)
Iron: 16 ug/dL — ABNORMAL LOW (ref 42–145)
Saturation Ratios: 4.7 % — ABNORMAL LOW (ref 20.0–50.0)
Transferrin: 243 mg/dL (ref 212.0–360.0)

## 2019-12-01 LAB — VITAMIN B12: Vitamin B-12: 475 pg/mL (ref 211–911)

## 2019-12-01 LAB — IGA: IgA: 465 mg/dL — ABNORMAL HIGH (ref 68–378)

## 2019-12-01 MED ORDER — OMEPRAZOLE 40 MG PO CPDR: 40.0000 mg | DELAYED_RELEASE_CAPSULE | Freq: Every day | ORAL | 3 refills | Status: DC

## 2019-12-01 NOTE — Progress Notes (Signed)
[place] VISIT   Primary Care Provider Binnie Rail, MD [address] [place] [address] [phone]  Patient Profile: Gloria Warren is a 86 y.o. female with a pmh significant for endometriosis, prior skin cancer, hypertension, hyperlipidemia, psoriasis, Raynaud's disease, rheumatoid arthritis, prior UTIs, recent DVT and previously on anticoagulation now status post IVC filter and off anticoagulation, recent hospitalization for hematochezia and finding of possible rectal perforation treated medically, ?diverticulosis noted on imaging, hiatal hernia (imaging based).  The patient presents to the [facility] for an evaluation and management of problem(s) noted below:  Problem List 1. Abnormal CT of the abdomen   2. Rectal perforation ([place])   3. Anemia, unspecified type   4. Non-intractable vomiting with nausea, unspecified vomiting type   5. History of rectal bleeding     History of Present Illness This is a patient that I met in the hospital in December when she presented with bright red blood per rectum while being on anticoagulation for a recently diagnosed VTE.  She was found to be Covid positive during her hospitalization.  The patient underwent IVC filter placement while anticoagulation was on hold.  Due to concern for potential diverticular hemorrhage in the setting of a positive Covid status, a CT scan was performed to evaluate for possible diverticular disease.  This was initially done without contrast due to.  On findings of CT scan and our radiologist found a likely contained rectal perforation, query stercoral ulcer leading to perforation.  Surgery was consulted.  She remained admitted to the hospital and was treated with antibiotics but did not require any drainage procedures or surgery.  Repeat CT scan with contrast suggested stable findings without any overt pneumoperitoneum though still concerning for perforation that  was contained versus a diverticulum in the rectum.  She was treated with 2 weeks of antibiotics.  Anticoagulation was held and she was discharged.  The patient has done well status post completion of her antibiotics.  She is back to having 1-2 Bristol scale 4 stools that are brown on a daily basis.  She has not had any recurrence of hematochezia.  She is not having any pain in her rectum.  She has dealt with a few episodes (3) of nausea and vomiting.  She has some mild dysphagia symptoms at that time but otherwise if she monitors what she is eating closely she does not have recurrence of her symptoms.  She had previously had some issues of this many years ago and has been told it was her hiatal hernia.  Imaging and suggest that she does have a hiatal hernia.  Patient does not have overt dyspepsia symptoms but at times can get indigestion.  Patient not having any significant abdominal pain.  She remains off anticoagulation.  She cannot recall any prior endoscopies.  Last colonoscopy was documented in 2004 is normal per her report.  She does have a prior anemia and has not had any blood counts checked since her hospitalization.  GI Review of Systems Positive as above Negative for odynophagia, bloating, melena, hematochezia  Review of Systems General: Denies fevers/chills/weight loss HEENT: Denies oral lesions Cardiovascular: Denies chest pain/palpitations Pulmonary: Denies shortness of breath Gastroenterological: See HPI Genitourinary: Denies darkened urine Hematological: Positive for easy bruising/bleeding even though she is off anticoagulation Endocrine: Denies temperature intolerance Dermatological: Denies jaundice Psychological: Mood is anxious about any other procedures being done due to her age medical issues   Medications Current Outpatient Medications  Medication Sig  Dispense Refill  . albuterol (PROVENTIL) (2.5 MG/3ML) 0.083% nebulizer solution Take 3 mLs (2.5 mg total) by nebulization  every 6 (six) hours as needed for wheezing. 75 mL 1  . benazepril (LOTENSIN) 10 MG tablet TAKE 1 TABLET(10 MG) BY MOUTH DAILY 90 tablet 1  . Calcium Carbonate (CALTRATE 600 PO) Take 600 mg by mouth 2 (two) times daily.      Marland Kitchen docusate sodium (COLACE) 100 MG capsule Take 100 mg by mouth daily as needed for mild constipation.    . DULoxetine (CYMBALTA) 30 MG capsule Take 30 mg by mouth daily.    Marland Kitchen escitalopram (LEXAPRO) 5 MG tablet Take 1 tablet (5 mg total) by mouth daily. 30 tablet 5  . Fexofenadine HCl (ALLEGRA PO) Take 180 mg by mouth at bedtime.     . furosemide (LASIX) 40 MG tablet Take 0.5 tablets (20 mg total) by mouth daily.    Marland Kitchen HYDROcodone-acetaminophen (NORCO/VICODIN) 5-325 MG tablet Take 1 tablet by mouth every 6 (six) hours as needed for moderate pain.     . hydroxychloroquine (PLAQUENIL) 200 MG tablet Take 200 mg by mouth 2 (two) times daily.   0  . Multiple Vitamins-Minerals (VISION-VITE PRESERVE PO) Take by mouth.    . predniSONE (DELTASONE) 5 MG tablet Take 7.5 mg by mouth daily with breakfast.     . simethicone (MYLICON) 542 MG chewable tablet Chew 125 mg by mouth every 6 (six) hours as needed for flatulence.    Marland Kitchen omeprazole (PRILOSEC) 40 MG capsule Take 1 capsule (40 mg total) by mouth daily. 30 capsule 3   No current facility-administered medications for this visit.    Allergies Allergies  Allergen Reactions  . Arava [Leflunomide]     Liver & kidney dysfunction  . Sulfonamide Derivatives     Itching & rash  . Remicade [Infliximab] Hives and Itching    Histories Past Medical History:  Diagnosis Date  . Allergic rhinitis   . Decreased GFR   . DVT (deep venous thrombosis) ([place])   . Endometriosis   . History of skin cancer    squamous & basal cell ;Dr Denna Haggard  . Hyperlipidemia   . Hypertension   . Osteoarthritis   . Psoriasis   . Raynaud disease   . Rheumatoid arthritis(714.0)    Dr. Estanislado Pandy  . UTI (lower urinary tract infection) 5/15   Proteus , R to  Nitrofurantoin & Penicillin   Past Surgical History:  Procedure Laterality Date  . APPENDECTOMY     age 69  . CATARACT EXTRACTION Bilateral   . COLONOSCOPY     Neg 2; [place] , [place]  . G 2  P 2    . TONSILLECTOMY    . TOTAL ABDOMINAL HYSTERECTOMY     age 27 for endometriosis (no BSO)  . VENA CAVA FILTER PLACEMENT N/A 10/22/2019   Procedure: INSERTION VENA-CAVA FILTER;  Surgeon: Marty Heck, MD;  Location: [facility];  Service: Vascular;  Laterality: N/A;  . VENOGRAM N/A 10/22/2019   Procedure: Venogram;  Surgeon: Marty Heck, MD;  Location: [place];  Service: Vascular;  Laterality: N/A;   Social History   Socioeconomic History  . Marital status: Married    Spouse name: Not on file  . Number of children: Not on file  . Years of education: Not on file  . Highest education level: Not on file  Occupational History  . Not on file  Tobacco Use  . Smoking status: Never Smoker  . Smokeless tobacco:  Never Used  Substance and Sexual Activity  . Alcohol use: Not Currently    Alcohol/week: 0.0 standard drinks    Comment:  rarely  . Drug use: No  . Sexual activity: Yes    Partners: Male    Birth control/protection: Surgical    Comment: TAH  Other Topics Concern  . Not on file  Social History Narrative  . Not on file   Social Determinants of Health   Financial Resource Strain:   . Difficulty of Paying Living Expenses: Not on file  Food Insecurity:   . Worried About Charity fundraiser in the Last Year: Not on file  . Ran Out of Food in the Last Year: Not on file  Transportation Needs:   . Lack of Transportation (Medical): Not on file  . Lack of Transportation (Non-Medical): Not on file  Physical Activity:   . Days of Exercise per Week: Not on file  . Minutes of Exercise per Session: Not on file  Stress:   . Feeling of Stress : Not on file  Social Connections:   . Frequency of Communication with Friends and Family: Not on file  . Frequency of Social  Gatherings with Friends and Family: Not on file  . Attends Religious Services: Not on file  . Active Member of Clubs or Organizations: Not on file  . Attends Archivist Meetings: Not on file  . Marital Status: Not on file  Intimate Partner Violence:   . Fear of Current or Ex-Partner: Not on file  . Emotionally Abused: Not on file  . Physically Abused: Not on file  . Sexually Abused: Not on file   Family History  Problem Relation Age of Onset  . Breast cancer Mother 19  . Hypertension Mother   . Gout Mother   . Hypertension Father   . Heart attack Father 68  . Stroke Sister        doing well  . Diabetes Maternal Aunt   . Hypertension Son   . Stroke Maternal Grandmother        late 43s  . Colon cancer Neg Hx   . Esophageal cancer Neg Hx   . Inflammatory bowel disease Neg Hx   . Liver disease Neg Hx   . Pancreatic cancer Neg Hx   . Rectal cancer Neg Hx   . Stomach cancer Neg Hx    I have reviewed her medical, social, and family history in detail and updated the electronic medical record as necessary.    PHYSICAL EXAMINATION  BP 100/60   Pulse 100   Temp 97.9 F (36.6 C)  Wt Readings from Last 3 Encounters:  10/20/19 129 lb (58.5 kg)  10/12/19 129 lb (58.5 kg)  09/24/19 129 lb (58.5 kg)  GEN: NAD, appears stated age, doesn't appear chronically ill, accompanied by son PSYCH: Cooperative, without pressured speech EYE: Conjunctivae pink, sclerae anicteric ENT: MMM CV: RR without R/Gs  RESP: No wheezing present GI: NABS, soft, NT/ND, without rebound or guarding MSK/EXT: Bilateral lower extremity edema present 1+ even with stockings in place NEURO:  Alert & Oriented x 3, no focal deficits   REVIEW OF DATA  I reviewed the following data at the time of this encounter:  GI Procedures and Studies  2004 colonoscopy Reported as normal  No prior endoscopies per patient and family and documentation in chart  Laboratory Studies  Reviewed those in  epic  Imaging Studies  October 27, 2019 CT abdomen and pelvis with contrast  IMPRESSION: 1. Interval decrease in size of the focal outpouching from the proximal rectum which now contains less stool. There is some adjacent edema/inflammation. As noted previously, contained extraperitoneal perforation cannot be excluded. Enlarged, patulous diverticulum is a consideration although this location is somewhat atypical. The patient does have other fairly patulous diverticuli in the left colon. Tortuosity of the distal sigmoid colon and proximal rectum is also possible although this collection does not appear to be in line with the main colonic lumen. 2.  Aortic Atherosclerois (ICD10-170.0) 3. Tiny bilateral pleural effusions. 4. Cholelithiasis with mural calcification in the gallbladder, features consistent with porcelain gallbladder.  October 23, 2019 CT abdomen pelvis without contrast IMPRESSION: 1. Findings of contained rectal perforation with abundant stool outside of the rectal and colonic lumen. Findings could also be related to repeated diverticulitis though at this location perforation related to diverticulitis would be somewhat unusual. Stercoral colitis and perforation as well as underlying neoplasm are also considered. Surgical consultation is suggested. 2. Sigmoid diverticular disease with potential intramural tract in the wall of the sigmoid colon, this could potentially explain the above findings. 3. Small bilateral pleural effusions. Cholelithiasis with calcifications of the gallbladder wall. Comparison with prior imaging and/or follow-up with contrasted CT in 6 months as clinically warranted is suggested given the risk of biliary neoplasm in the setting of "porcelain gallbladder". 4. A call is out to the referring provider to further discuss findings and above case.   ASSESSMENT  Ms. Gritz is a 86 y.o. female with a pmh significant for endometriosis, prior skin  cancer, hypertension, hyperlipidemia, psoriasis, Raynaud's disease, rheumatoid arthritis, prior UTIs, recent DVT and previously on anticoagulation now status post IVC filter and off anticoagulation, recent hospitalization for hematochezia and finding of possible rectal perforation treated medically, ?diverticulosis noted on imaging, hiatal hernia (imaging based).  The patient is seen today for evaluation and management of:  1. Abnormal CT of the abdomen   2. Rectal perforation ([place])   3. Anemia, unspecified type   4. Non-intractable vomiting with nausea, unspecified vomiting type   5. History of rectal bleeding    The patient is hemodynamically stable and clinically doing better than when we met her in the hospital in December.  Her presentation was extremely atypical but CT imaging findings on 2 separate imaging studies were very concerning for a contained rectal perforation.  He no longer is having any bleeding having normal bowel movements.  Would like to repeat CT imaging prior to a colonoscopic evaluation.  Patient is hesitant about this but I think it is reasonable for [place] to strongly consider.  The patient's son agrees to move forward with CT imaging first and then further discussion about potential colonoscopy versus flex sig colonoscopy (understanding she would need to be prepped either way).  The patient has also had issues and symptoms of recurrent nausea with vomiting.  3 significant episodes.  She has had a prior hiatal hernia noted per her clinical history as well as on imaging studies.  Not sure if the hiatal hernia is the actual cause of symptoms but it is worthwhile for [place] to also consider ruling out structural issues that could be causing her issues.  If she eats and chews thoroughly she does not have any significant problem.  We will rule out H. pylori for potential indigestion causes symptoms initiate her on a PPI daily.  I will see her back in approximately 4 weeks to see how her symptoms  are doing and subsequently also proceed  with potential endoscopic evaluation from above and below.  I have asked the patient and son to reach out to the patient's primary care provider if her weight starts increasing and her lower extremity swelling worsens.  Due to her anemia would like to move forward with evaluation for other etiologies of symptoms we will send some blood work for that.   PLAN  Laboratories as outlined below CT abdomen/pelvis with IV and rectal contrast to further evaluate the previously noted rectal perforation and rectal findings H. pylori stool antigen to be obtained Initiate PPI once daily after stool antigen is received Follow-up in approximately 4 weeks and will consider endoscopic evaluation at that time after CT has been reviewed and to see how patient's symptoms are (above and below) Holding on antiemetic per discussion with patient/son for now   Orders Placed This Encounter  Procedures  . Helicobacter pylori special antigen  . CT Abdomen Pelvis W Contrast  . CBC  . Comp Met (CMET)  . IBC + Ferritin  . B12  . IgA  . Tissue transglutaminase, IgA    New Prescriptions   OMEPRAZOLE (PRILOSEC) 40 MG CAPSULE    Take 1 capsule (40 mg total) by mouth daily.   Modified Medications   No medications on file    Planned Follow Up No follow-ups on file.   Total Time in Face-to-Face and in Coordination of Care for patient including independent/personal interpretation/review of prior testing, medical history, examination, medication adjustment, communicating results with the patient directly, and documentation with the EHR is greater than 30 minutes.   Justice Britain, MD [facility] Office # [phone]

## 2019-12-01 NOTE — Patient Instructions (Signed)
Your provider has requested that you go to the basement level for lab work before leaving today. Press "B" on the elevator. The lab is located at the first door on the left as you exit the elevator.  You have been scheduled for a CT scan of the abdomen and pelvis at [place] [facility] are scheduled on 1/26/2021at 1:30pm. You should arrive 15 minutes prior to your appointment time for registration. Please follow the written instructions below on the day of your exam:  WARNING: IF YOU ARE ALLERGIC TO IODINE/X-RAY DYE, PLEASE NOTIFY RADIOLOGY IMMEDIATELY AT [phone]! YOU WILL BE GIVEN A 13 HOUR PREMEDICATION PREP.  1) Do not eat or drink anything after 9:30am (4 hours prior to your test) 2) You have been given 2 bottles of oral contrast to drink. The solution may taste better if refrigerated, but do NOT add ice or any other liquid to this solution. Shake well before drinking.    Drink 1 bottle of contrast @ 11:30am (2 hours prior to your exam)  Drink 1 bottle of contrast @ 12:30pm (1 hour prior to your exam)  You may take any medications as prescribed with a small amount of water, if necessary. If you take any of the following medications: METFORMIN, GLUCOPHAGE, GLUCOVANCE, AVANDAMET, RIOMET, FORTAMET, [place] MET, JANUMET, GLUMETZA or METAGLIP, you MAY be asked to HOLD this medication 48 hours AFTER the exam.  The purpose of you drinking the oral contrast is to aid in the visualization of your intestinal tract. The contrast solution may cause some diarrhea. Depending on your individual set of symptoms, you may also receive an intravenous injection of x-ray contrast/dye. Plan on being at [facility] for 30 minutes or longer, depending on the type of exam you are having performed.  This test typically takes 30-45 minutes to complete.  If you have any questions regarding your exam or if you need to reschedule, you may call the CT department at [phone] between the hours of  8:00 am and 5:00 pm, Monday-Friday.   ____________________________________________________________     After you have submitted stool study you can start once daily Omeprazole.  We have sent the following medications to your pharmacy for you to pick up at your convenience: Omeprazole

## 2019-12-02 ENCOUNTER — Other Ambulatory Visit: Payer: Self-pay

## 2019-12-02 ENCOUNTER — Other Ambulatory Visit (INDEPENDENT_AMBULATORY_CARE_PROVIDER_SITE_OTHER): Payer: Medicare Other

## 2019-12-02 ENCOUNTER — Telehealth: Payer: Self-pay

## 2019-12-02 DIAGNOSIS — M25561 Pain in right knee: Secondary | ICD-10-CM | POA: Diagnosis not present

## 2019-12-02 DIAGNOSIS — K631 Perforation of intestine (nontraumatic): Secondary | ICD-10-CM

## 2019-12-02 DIAGNOSIS — R935 Abnormal findings on diagnostic imaging of other abdominal regions, including retroperitoneum: Secondary | ICD-10-CM

## 2019-12-02 DIAGNOSIS — R2681 Unsteadiness on feet: Secondary | ICD-10-CM | POA: Diagnosis not present

## 2019-12-02 DIAGNOSIS — D649 Anemia, unspecified: Secondary | ICD-10-CM | POA: Diagnosis not present

## 2019-12-02 DIAGNOSIS — R1110 Vomiting, unspecified: Secondary | ICD-10-CM | POA: Insufficient documentation

## 2019-12-02 DIAGNOSIS — Z8719 Personal history of other diseases of the digestive system: Secondary | ICD-10-CM | POA: Insufficient documentation

## 2019-12-02 DIAGNOSIS — R112 Nausea with vomiting, unspecified: Secondary | ICD-10-CM

## 2019-12-02 DIAGNOSIS — M6281 Muscle weakness (generalized): Secondary | ICD-10-CM | POA: Diagnosis not present

## 2019-12-02 LAB — FOLATE: Folate: 15.8 ng/mL (ref >5.9)

## 2019-12-02 LAB — TISSUE TRANSGLUTAMINASE, IGA: (tTG) Ab, IgA: 1 U/mL

## 2019-12-02 MED ORDER — FERROUS GLUCONATE 324 (38 FE) MG PO TABS: 324.0000 mg | ORAL_TABLET | Freq: Every day | ORAL | 3 refills | Status: DC

## 2019-12-02 NOTE — Telephone Encounter (Signed)
CT order has been upated to non-IV contrast , oral and rectal contrast only. Rx for ferrous gluconate has been sent to pts pharmacy.

## 2019-12-02 NOTE — Telephone Encounter (Signed)
-----   Message from Irving Copas., MD sent at 12/02/2019  1:05 PM EST ----- I called and spoke with the patient's son to update him about the results of the labs done yesterday.  He had already reviewed them on my chart briefly.  She has a iron deficiency anemia.  She also has a progressive worsening of her creatinine.   We will will need to begin her on oral iron supplementation.  She may need IV iron supplementation at some point.The patient's creatinine elevation also causes me a bit of concern in regards to giving her IV contrast.  I do not really need an IV contrast but rather the rectal contrast.  We will transition her CT scan from an IV contrasted CT to a non-IV contrast CT.I will forward this laboratory evaluation to the patient's primary care doctor, Dr. Quay Burow. Dresden Lozito,Please send ferrous gluconate 324 mg once daily to the patient's pharmacy (30 pills with 6 refills).Please change the CT scan from an IV contrasted CT to a non-IV contrasted CT, but still need the rectal contrast as we discussed previously.Also, the patient cannot get to that CT scan on that particular date and needs to be changed.  I would recheck to the patient's son to see what will work in terms of dates.Thanks.GM

## 2019-12-03 ENCOUNTER — Other Ambulatory Visit: Payer: Medicare Other

## 2019-12-03 DIAGNOSIS — R935 Abnormal findings on diagnostic imaging of other abdominal regions, including retroperitoneum: Secondary | ICD-10-CM | POA: Diagnosis not present

## 2019-12-03 DIAGNOSIS — D649 Anemia, unspecified: Secondary | ICD-10-CM

## 2019-12-03 DIAGNOSIS — K631 Perforation of intestine (nontraumatic): Secondary | ICD-10-CM

## 2019-12-03 DIAGNOSIS — R112 Nausea with vomiting, unspecified: Secondary | ICD-10-CM

## 2019-12-04 ENCOUNTER — Telehealth: Payer: Self-pay | Admitting: Gastroenterology

## 2019-12-04 LAB — HELICOBACTER PYLORI  SPECIAL ANTIGEN
MICRO NUMBER:: 10066446
SPECIMEN QUALITY: ADEQUATE

## 2019-12-04 NOTE — Telephone Encounter (Signed)
The pt son was given the number to call and move the CT scan to a day that works best for him and the pt.  He was also advised that the CT is without contrast.  The pt has been advised of the information and verbalized understanding.

## 2019-12-08 ENCOUNTER — Ambulatory Visit (HOSPITAL_COMMUNITY): Payer: Medicare Other

## 2019-12-08 DIAGNOSIS — M2550 Pain in unspecified joint: Secondary | ICD-10-CM | POA: Diagnosis not present

## 2019-12-08 DIAGNOSIS — M542 Cervicalgia: Secondary | ICD-10-CM | POA: Diagnosis not present

## 2019-12-08 DIAGNOSIS — M150 Primary generalized (osteo)arthritis: Secondary | ICD-10-CM | POA: Diagnosis not present

## 2019-12-08 DIAGNOSIS — M0579 Rheumatoid arthritis with rheumatoid factor of multiple sites without organ or systems involvement: Secondary | ICD-10-CM | POA: Diagnosis not present

## 2019-12-08 DIAGNOSIS — L03114 Cellulitis of left upper limb: Secondary | ICD-10-CM | POA: Diagnosis not present

## 2019-12-08 DIAGNOSIS — R600 Localized edema: Secondary | ICD-10-CM | POA: Diagnosis not present

## 2019-12-08 DIAGNOSIS — N1832 Chronic kidney disease, stage 3b: Secondary | ICD-10-CM | POA: Diagnosis not present

## 2019-12-10 DIAGNOSIS — R2681 Unsteadiness on feet: Secondary | ICD-10-CM | POA: Diagnosis not present

## 2019-12-10 DIAGNOSIS — M25561 Pain in right knee: Secondary | ICD-10-CM | POA: Diagnosis not present

## 2019-12-10 DIAGNOSIS — M6281 Muscle weakness (generalized): Secondary | ICD-10-CM | POA: Diagnosis not present

## 2019-12-11 ENCOUNTER — Telehealth: Payer: Self-pay | Admitting: Internal Medicine

## 2019-12-11 NOTE — Telephone Encounter (Signed)
Spoke with son to see how pt was doing. He states that she has a deep wound on the back of her knee and the aid that came in today states it looks worse than yesterday. He said that it is draining yellow like fluid and does have a smell to the fluid. He has an appointment made for patient on 2/1 but I did advise him that pt would prob need to be seen sooner. He refused UC and ED at this time due to past experiences. I told him we could do a referral to wound care but we just aren't sure when pt could be seen. As of right now son wants to wait until appointment on Monday.

## 2019-12-11 NOTE — Telephone Encounter (Addendum)
     Son calling to report blisters on back of knees, drainage with odor Offered same day appointment; declined. Scheduled for 2/1   Please contact son to discuss

## 2019-12-13 NOTE — Progress Notes (Signed)
Subjective:    Patient ID: Gloria Warren, female    DOB: 11-13-1930, 86 y.o.   MRN: [id number]  HPI The patient is here for an acute visit.  She is here with her son.    She has home health aids continuously to help with all of her ADLs.  She wears compression socks daily.  She developed a blister behind her right knee.  The blister popped and she has fluid come out.  Someone thought there was a smell to it.  She denies pain.  She denies fever/chills.  They have put neosporin it.   She has many skin issues - she has recurrent skin tears, sacral pressure ulcer  - it is just her skin.   She will be getting a lift to help with transfers.   She has a mild sacral ulcer.     Medications and allergies reviewed with patient and updated if appropriate.  Patient Active Problem List   Diagnosis Date Noted  . Abnormal CT of the abdomen 12/02/2019  . Rectal perforation ([place]) 12/02/2019  . Anemia 12/02/2019  . Non-intractable vomiting 12/02/2019  . History of rectal bleeding 12/02/2019  . Acute lower GI bleeding 10/22/2019  . Depression 10/22/2019  . COVID-19 10/21/2019  . DVT (deep venous thrombosis) ([place]) 10/12/2019  . Pain and swelling of right lower leg 09/24/2019  . Nausea 06/29/2019  . Vitamin D deficiency 06/29/2019  . Osteoarthritis of both feet 09/25/2016  . Raynaud disease 09/25/2016  . Primary osteoarthritis of both knees 09/24/2016  . Primary osteoarthritis of both hands 09/24/2016  . Chronic kidney disease (CKD) stage G3b 09/24/2016  . Leg edema 11/28/2015  . Psoriasis 03/11/2013  . SKIN CANCER, HX OF 06/26/2010  . Allergic rhinitis 06/23/2009  . Prediabetes 06/23/2009  . Rheumatoid arthritis ([place]) 02/25/2008  . Hyperlipidemia 11/17/2007  . Essential hypertension 11/17/2007    Current Outpatient Medications on File Prior to Visit  Medication Sig Dispense Refill  . albuterol (PROVENTIL) (2.5 MG/3ML) 0.083% nebulizer solution Take 3 mLs (2.5 mg total) by  nebulization every 6 (six) hours as needed for wheezing. 75 mL 1  . benazepril (LOTENSIN) 10 MG tablet TAKE 1 TABLET(10 MG) BY MOUTH DAILY 90 tablet 1  . Calcium Carbonate (CALTRATE 600 PO) Take 600 mg by mouth 2 (two) times daily.      Marland Kitchen docusate sodium (COLACE) 100 MG capsule Take 100 mg by mouth daily as needed for mild constipation.    . DULoxetine (CYMBALTA) 30 MG capsule Take 30 mg by mouth daily.    Marland Kitchen escitalopram (LEXAPRO) 5 MG tablet Take 1 tablet (5 mg total) by mouth daily. 30 tablet 5  . ferrous gluconate (FERGON) 324 MG tablet Take 1 tablet (324 mg total) by mouth daily with breakfast. 30 tablet 3  . Fexofenadine HCl (ALLEGRA PO) Take 180 mg by mouth at bedtime.     . furosemide (LASIX) 40 MG tablet Take 0.5 tablets (20 mg total) by mouth daily.    Marland Kitchen HYDROcodone-acetaminophen (NORCO/VICODIN) 5-325 MG tablet Take 1 tablet by mouth every 6 (six) hours as needed for moderate pain.     . hydroxychloroquine (PLAQUENIL) 200 MG tablet Take 200 mg by mouth 2 (two) times daily.   0  . Multiple Vitamins-Minerals (VISION-VITE PRESERVE PO) Take by mouth.    Marland Kitchen omeprazole (PRILOSEC) 40 MG capsule Take 1 capsule (40 mg total) by mouth daily. 30 capsule 3  . predniSONE (DELTASONE) 5 MG tablet Take 7.5 mg by mouth  daily with breakfast.     . simethicone (MYLICON) 0000000 MG chewable tablet Chew 125 mg by mouth every 6 (six) hours as needed for flatulence.     No current facility-administered medications on file prior to visit.    Past Medical History:  Diagnosis Date  . Allergic rhinitis   . Decreased GFR   . DVT (deep venous thrombosis) ([place])   . Endometriosis   . History of skin cancer    squamous & basal cell ;Dr Denna Haggard  . Hyperlipidemia   . Hypertension   . Osteoarthritis   . Psoriasis   . Raynaud disease   . Rheumatoid arthritis(714.0)    Dr. Estanislado Pandy  . UTI (lower urinary tract infection) 5/15   Proteus , R to Nitrofurantoin & Penicillin    Past Surgical History:  Procedure  Laterality Date  . APPENDECTOMY     age 62  . CATARACT EXTRACTION Bilateral   . COLONOSCOPY     Neg 2; [place] , [place]  . G 2  P 2    . TONSILLECTOMY    . TOTAL ABDOMINAL HYSTERECTOMY     age 63 for endometriosis (no BSO)  . VENA CAVA FILTER PLACEMENT N/A 10/22/2019   Procedure: INSERTION VENA-CAVA FILTER;  Surgeon: Marty Heck, MD;  Location: [facility];  Service: Vascular;  Laterality: N/A;  . VENOGRAM N/A 10/22/2019   Procedure: Venogram;  Surgeon: Marty Heck, MD;  Location: [facility];  Service: Vascular;  Laterality: N/A;    Social History   Socioeconomic History  . Marital status: Married    Spouse name: Not on file  . Number of children: Not on file  . Years of education: Not on file  . Highest education level: Not on file  Occupational History  . Not on file  Tobacco Use  . Smoking status: Never Smoker  . Smokeless tobacco: Never Used  Substance and Sexual Activity  . Alcohol use: Not Currently    Alcohol/week: 0.0 standard drinks    Comment:  rarely  . Drug use: No  . Sexual activity: Yes    Partners: Male    Birth control/protection: Surgical    Comment: TAH  Other Topics Concern  . Not on file  Social History Narrative  . Not on file   Social Determinants of Health   Financial Resource Strain:   . Difficulty of Paying Living Expenses: Not on file  Food Insecurity:   . Worried About Charity fundraiser in the Last Year: Not on file  . Ran Out of Food in the Last Year: Not on file  Transportation Needs:   . Lack of Transportation (Medical): Not on file  . Lack of Transportation (Non-Medical): Not on file  Physical Activity:   . Days of Exercise per Week: Not on file  . Minutes of Exercise per Session: Not on file  Stress:   . Feeling of Stress : Not on file  Social Connections:   . Frequency of Communication with Friends and Family: Not on file  . Frequency of Social Gatherings with Friends and Family: Not on file  . Attends Religious  Services: Not on file  . Active Member of Clubs or Organizations: Not on file  . Attends Archivist Meetings: Not on file  . Marital Status: Not on file    Family History  Problem Relation Age of Onset  . Breast cancer Mother 36  . Hypertension Mother   . Gout Mother   . Hypertension  Father   . Heart attack Father 103  . Stroke Sister        doing well  . Diabetes Maternal Aunt   . Hypertension Son   . Stroke Maternal Grandmother        late 48s  . Colon cancer Neg Hx   . Esophageal cancer Neg Hx   . Inflammatory bowel disease Neg Hx   . Liver disease Neg Hx   . Pancreatic cancer Neg Hx   . Rectal cancer Neg Hx   . Stomach cancer Neg Hx     Review of Systems  Constitutional: Negative for chills and fever.  Respiratory: Negative for cough, shortness of breath and wheezing.   Cardiovascular: Positive for leg swelling (chronic). Negative for chest pain and palpitations.  Neurological: Negative for light-headedness and headaches.       Objective:   Vitals:   12/14/19 1458  BP: 118/78  Resp: 16  Temp: 98.3 F (36.8 C)   BP Readings from Last 3 Encounters:  12/14/19 118/78  12/01/19 100/60  10/28/19 (!) 114/53   Wt Readings from Last 3 Encounters:  10/20/19 129 lb (58.5 kg)  10/12/19 129 lb (58.5 kg)  09/24/19 129 lb (58.5 kg)   Body mass index is 25.19 kg/m.   Physical Exam Constitutional:      General: She is not in acute distress.    Appearance: Normal appearance. She is not ill-appearing.  HENT:     Head: Normocephalic and atraumatic.  Musculoskeletal:     Right lower leg: Edema (1 + pitting LE edmea) present.     Left lower leg: Edema (1 + pitting LE edmea) present.  Skin:    General: Skin is warm and dry.     Findings: Bruising (several bruises on extremities) and lesion (ulcer posterior right knee where the blister was - area is slightly erythematous and indurated, nontender, no discharge or bleeding) present.  Neurological:     Mental  Status: She is alert.            Assessment & Plan:    See Problem List for Assessment and Plan of chronic medical problems.    This visit occurred during the SARS-CoV-2 public health emergency.  Safety protocols were in place, including screening questions prior to the visit, additional usage of staff PPE, and extensive cleaning of exam room while observing appropriate contact time as indicated for disinfecting solutions.

## 2019-12-14 ENCOUNTER — Encounter: Payer: Self-pay | Admitting: Internal Medicine

## 2019-12-14 ENCOUNTER — Ambulatory Visit (INDEPENDENT_AMBULATORY_CARE_PROVIDER_SITE_OTHER): Payer: Medicare Other | Admitting: Internal Medicine

## 2019-12-14 ENCOUNTER — Other Ambulatory Visit: Payer: Self-pay

## 2019-12-14 DIAGNOSIS — L8990 Pressure ulcer of unspecified site, unspecified stage: Secondary | ICD-10-CM | POA: Insufficient documentation

## 2019-12-14 DIAGNOSIS — L98491 Non-pressure chronic ulcer of skin of other sites limited to breakdown of skin: Secondary | ICD-10-CM | POA: Diagnosis not present

## 2019-12-14 DIAGNOSIS — L98429 Non-pressure chronic ulcer of back with unspecified severity: Secondary | ICD-10-CM | POA: Diagnosis not present

## 2019-12-14 DIAGNOSIS — L089 Local infection of the skin and subcutaneous tissue, unspecified: Secondary | ICD-10-CM | POA: Diagnosis not present

## 2019-12-14 DIAGNOSIS — L98499 Non-pressure chronic ulcer of skin of other sites with unspecified severity: Secondary | ICD-10-CM | POA: Insufficient documentation

## 2019-12-14 DIAGNOSIS — M6281 Muscle weakness (generalized): Secondary | ICD-10-CM | POA: Diagnosis not present

## 2019-12-14 DIAGNOSIS — M25561 Pain in right knee: Secondary | ICD-10-CM | POA: Diagnosis not present

## 2019-12-14 DIAGNOSIS — R2681 Unsteadiness on feet: Secondary | ICD-10-CM | POA: Diagnosis not present

## 2019-12-14 MED ORDER — DOXYCYCLINE HYCLATE 100 MG PO TABS: 100.0000 mg | ORAL_TABLET | Freq: Two times a day (BID) | ORAL | 0 refills | Status: DC

## 2019-12-14 NOTE — Patient Instructions (Addendum)
For the wound on the back of your right knee apply anti-bacterial ointment.  Take 5 days of an antibiotic.   A referral was ordered for a home health nurse.

## 2019-12-14 NOTE — Assessment & Plan Note (Signed)
New From blister on back of right knee - possibly from friction from compression socks or with transfers With mild infection - doxycyline BID x 5 days - will extend if needed Topical antibiotic ointment Will order home nursing for wound care

## 2019-12-14 NOTE — Assessment & Plan Note (Signed)
Sacral ulcer not looked at due to difficulties in getting her up- from their description looks to be stage 1 Will order home nursing for wound care

## 2019-12-15 ENCOUNTER — Ambulatory Visit (HOSPITAL_COMMUNITY): Admission: RE | Admit: 2019-12-15 | Payer: Medicare Other | Source: Ambulatory Visit

## 2019-12-15 ENCOUNTER — Ambulatory Visit: Payer: Medicare Other | Admitting: Internal Medicine

## 2019-12-15 ENCOUNTER — Telehealth: Payer: Self-pay | Admitting: Internal Medicine

## 2019-12-15 NOTE — Telephone Encounter (Signed)
FYI

## 2019-12-15 NOTE — Telephone Encounter (Signed)
Patients son called stating that patient was seen in the office yesterday 12/15/19.  States that patient had a new caregiver yesterday.  States the caregiver left due to not feeling well and has tested positive for COVID.  States that patient and caregiver wore mask while around each other yesterday.   Caregiver did not bring patient to the office.   Son wanted Dr. Quay Burow to be aware.

## 2019-12-18 ENCOUNTER — Other Ambulatory Visit: Payer: Medicare Other

## 2019-12-18 DIAGNOSIS — Z20828 Contact with and (suspected) exposure to other viral communicable diseases: Secondary | ICD-10-CM | POA: Diagnosis not present

## 2019-12-23 DIAGNOSIS — M069 Rheumatoid arthritis, unspecified: Secondary | ICD-10-CM | POA: Diagnosis not present

## 2019-12-23 DIAGNOSIS — F329 Major depressive disorder, single episode, unspecified: Secondary | ICD-10-CM | POA: Diagnosis not present

## 2019-12-23 DIAGNOSIS — Z8616 Personal history of COVID-19: Secondary | ICD-10-CM | POA: Diagnosis not present

## 2019-12-23 DIAGNOSIS — S51802D Unspecified open wound of left forearm, subsequent encounter: Secondary | ICD-10-CM | POA: Diagnosis not present

## 2019-12-23 DIAGNOSIS — Z7952 Long term (current) use of systemic steroids: Secondary | ICD-10-CM | POA: Diagnosis not present

## 2019-12-23 DIAGNOSIS — E785 Hyperlipidemia, unspecified: Secondary | ICD-10-CM | POA: Diagnosis not present

## 2019-12-23 DIAGNOSIS — Z79891 Long term (current) use of opiate analgesic: Secondary | ICD-10-CM | POA: Diagnosis not present

## 2019-12-23 DIAGNOSIS — N1832 Chronic kidney disease, stage 3b: Secondary | ICD-10-CM | POA: Diagnosis not present

## 2019-12-23 DIAGNOSIS — I129 Hypertensive chronic kidney disease with stage 1 through stage 4 chronic kidney disease, or unspecified chronic kidney disease: Secondary | ICD-10-CM | POA: Diagnosis not present

## 2019-12-23 DIAGNOSIS — M159 Polyosteoarthritis, unspecified: Secondary | ICD-10-CM | POA: Diagnosis not present

## 2019-12-23 DIAGNOSIS — L089 Local infection of the skin and subcutaneous tissue, unspecified: Secondary | ICD-10-CM | POA: Diagnosis not present

## 2019-12-23 DIAGNOSIS — Z86718 Personal history of other venous thrombosis and embolism: Secondary | ICD-10-CM | POA: Diagnosis not present

## 2019-12-23 DIAGNOSIS — S80221D Blister (nonthermal), right knee, subsequent encounter: Secondary | ICD-10-CM | POA: Diagnosis not present

## 2019-12-23 DIAGNOSIS — I7300 Raynaud's syndrome without gangrene: Secondary | ICD-10-CM | POA: Diagnosis not present

## 2019-12-28 ENCOUNTER — Telehealth: Payer: Self-pay

## 2019-12-28 NOTE — Telephone Encounter (Signed)
Anissa has called back again. Spoke with Lovena Le - Verbal has been giving.

## 2019-12-28 NOTE — Telephone Encounter (Signed)
Anissa with [facility] calling and states that she spoke with the patient's son and he does not want patient to get Home Health through [facility]. States that she is needing an order to discontinue services. Please advise.  CB#: [phone]

## 2019-12-29 ENCOUNTER — Telehealth: Payer: Self-pay | Admitting: Gastroenterology

## 2019-12-29 NOTE — Telephone Encounter (Signed)
The pt  son states he will keep the appt as planned.

## 2019-12-29 NOTE — Telephone Encounter (Signed)
Patty, I will obviously defer to the patient/son as to how they want to proceed. I strongly recommend that we perform the CTAP (non-invasive) and then consider endoscopic evaluation in the future, but I cannot do something that a patient/family does not want to. I think the appointment to talk with them further is reasonable. However, they can let us know. The decision and chance of missing a lesion is now something they must be OK with should no further evaluation be pursued. Thanks. GM

## 2019-12-29 NOTE — Telephone Encounter (Signed)
Dr Rush Landmark the pt did not have CT scan that was recommended; she states she does not wish to have any further testing.  Does she need to keep appt for 2/18?

## 2019-12-31 ENCOUNTER — Ambulatory Visit: Payer: Medicare Other | Admitting: Gastroenterology

## 2020-01-05 DIAGNOSIS — N159 Renal tubulo-interstitial disease, unspecified: Secondary | ICD-10-CM

## 2020-01-05 DIAGNOSIS — I7300 Raynaud's syndrome without gangrene: Secondary | ICD-10-CM | POA: Diagnosis not present

## 2020-01-05 DIAGNOSIS — N069 Isolated proteinuria with unspecified morphologic lesion: Secondary | ICD-10-CM

## 2020-01-05 DIAGNOSIS — S80221D Blister (nonthermal), right knee, subsequent encounter: Secondary | ICD-10-CM | POA: Diagnosis not present

## 2020-01-05 DIAGNOSIS — R2681 Unsteadiness on feet: Secondary | ICD-10-CM | POA: Diagnosis not present

## 2020-01-05 DIAGNOSIS — E785 Hyperlipidemia, unspecified: Secondary | ICD-10-CM

## 2020-01-05 DIAGNOSIS — N1832 Chronic kidney disease, stage 3b: Secondary | ICD-10-CM

## 2020-01-05 DIAGNOSIS — Z79891 Long term (current) use of opiate analgesic: Secondary | ICD-10-CM

## 2020-01-05 DIAGNOSIS — M25561 Pain in right knee: Secondary | ICD-10-CM | POA: Diagnosis not present

## 2020-01-05 DIAGNOSIS — F329 Major depressive disorder, single episode, unspecified: Secondary | ICD-10-CM

## 2020-01-05 DIAGNOSIS — Z7952 Long term (current) use of systemic steroids: Secondary | ICD-10-CM

## 2020-01-05 DIAGNOSIS — M6281 Muscle weakness (generalized): Secondary | ICD-10-CM | POA: Diagnosis not present

## 2020-01-05 DIAGNOSIS — Z86718 Personal history of other venous thrombosis and embolism: Secondary | ICD-10-CM

## 2020-01-05 DIAGNOSIS — S51802D Unspecified open wound of left forearm, subsequent encounter: Secondary | ICD-10-CM | POA: Diagnosis not present

## 2020-01-05 DIAGNOSIS — L089 Local infection of the skin and subcutaneous tissue, unspecified: Secondary | ICD-10-CM | POA: Diagnosis not present

## 2020-01-05 DIAGNOSIS — I129 Hypertensive chronic kidney disease with stage 1 through stage 4 chronic kidney disease, or unspecified chronic kidney disease: Secondary | ICD-10-CM

## 2020-01-05 DIAGNOSIS — Z8616 Personal history of COVID-19: Secondary | ICD-10-CM

## 2020-01-11 ENCOUNTER — Other Ambulatory Visit: Payer: Self-pay | Admitting: Internal Medicine

## 2020-01-11 DIAGNOSIS — M25561 Pain in right knee: Secondary | ICD-10-CM | POA: Diagnosis not present

## 2020-01-11 DIAGNOSIS — R2681 Unsteadiness on feet: Secondary | ICD-10-CM | POA: Diagnosis not present

## 2020-01-11 DIAGNOSIS — M6281 Muscle weakness (generalized): Secondary | ICD-10-CM | POA: Diagnosis not present

## 2020-01-20 DIAGNOSIS — M25561 Pain in right knee: Secondary | ICD-10-CM | POA: Diagnosis not present

## 2020-01-20 DIAGNOSIS — R2681 Unsteadiness on feet: Secondary | ICD-10-CM | POA: Diagnosis not present

## 2020-01-20 DIAGNOSIS — M6281 Muscle weakness (generalized): Secondary | ICD-10-CM | POA: Diagnosis not present

## 2020-01-25 DIAGNOSIS — R2681 Unsteadiness on feet: Secondary | ICD-10-CM | POA: Diagnosis not present

## 2020-01-25 DIAGNOSIS — M6281 Muscle weakness (generalized): Secondary | ICD-10-CM | POA: Diagnosis not present

## 2020-01-25 DIAGNOSIS — M25561 Pain in right knee: Secondary | ICD-10-CM | POA: Diagnosis not present

## 2020-02-03 ENCOUNTER — Telehealth: Payer: Self-pay | Admitting: Internal Medicine

## 2020-02-03 MED ORDER — FUROSEMIDE 40 MG PO TABS: 20.0000 mg | ORAL_TABLET | Freq: Every day | ORAL | 0 refills | Status: DC

## 2020-02-03 NOTE — Telephone Encounter (Signed)
Ok to fill? You did not last fill

## 2020-02-03 NOTE — Telephone Encounter (Signed)
  Hassan Rowan from [facility] calling to request refill for patient. The son states patient only takes 20MG  daily. He has been cutting the pill in half. The dose was changed during hospital stay [facility]  1.Medication Requested: furosemide (LASIX) 20 MG tablet   2. Pharmacy (Name, Street, [place]): [facility]  3. On Med List: yes  4. Last Visit with PCP: 12/14/19  5. Next visit date with PCP:   Agent: Please be advised that RX refills may take up to 3 business days. We ask that you follow-up with your pharmacy.

## 2020-02-03 NOTE — Telephone Encounter (Signed)
Rx sent 

## 2020-02-03 NOTE — Telephone Encounter (Signed)
Okay to fill? 

## 2020-02-10 ENCOUNTER — Other Ambulatory Visit: Payer: Self-pay

## 2020-02-10 MED ORDER — BENAZEPRIL HCL 10 MG PO TABS: ORAL_TABLET | ORAL | 1 refills | Status: DC

## 2020-02-12 ENCOUNTER — Other Ambulatory Visit: Payer: Self-pay | Admitting: Family Medicine

## 2020-02-12 DIAGNOSIS — J45998 Other asthma: Secondary | ICD-10-CM

## 2020-02-12 DIAGNOSIS — J069 Acute upper respiratory infection, unspecified: Secondary | ICD-10-CM

## 2020-02-15 NOTE — Telephone Encounter (Signed)
SB-Plz see refill req/thx dmf

## 2020-02-17 ENCOUNTER — Encounter: Payer: Self-pay | Admitting: Gastroenterology

## 2020-02-17 ENCOUNTER — Ambulatory Visit (INDEPENDENT_AMBULATORY_CARE_PROVIDER_SITE_OTHER): Payer: Medicare Other | Admitting: Gastroenterology

## 2020-02-17 ENCOUNTER — Other Ambulatory Visit (INDEPENDENT_AMBULATORY_CARE_PROVIDER_SITE_OTHER): Payer: Medicare Other

## 2020-02-17 VITALS — BP 112/68 | HR 99 | Temp 97.5°F | Ht 60.0 in

## 2020-02-17 DIAGNOSIS — R112 Nausea with vomiting, unspecified: Secondary | ICD-10-CM

## 2020-02-17 DIAGNOSIS — R935 Abnormal findings on diagnostic imaging of other abdominal regions, including retroperitoneum: Secondary | ICD-10-CM

## 2020-02-17 DIAGNOSIS — K631 Perforation of intestine (nontraumatic): Secondary | ICD-10-CM

## 2020-02-17 DIAGNOSIS — D649 Anemia, unspecified: Secondary | ICD-10-CM | POA: Diagnosis not present

## 2020-02-17 LAB — IBC + FERRITIN
Ferritin: 28.6 ng/mL (ref 10.0–291.0)
Iron: 24 ug/dL — ABNORMAL LOW (ref 42–145)
Saturation Ratios: 7 % — ABNORMAL LOW (ref 20.0–50.0)
Transferrin: 245 mg/dL (ref 212.0–360.0)

## 2020-02-17 LAB — CBC
HCT: 31.4 % — ABNORMAL LOW (ref 36.0–46.0)
Hemoglobin: 9.9 g/dL — ABNORMAL LOW (ref 12.0–15.0)
MCHC: 31.6 g/dL (ref 30.0–36.0)
MCV: 87.5 fl (ref 78.0–100.0)
Platelets: 326 10*3/uL (ref 150.0–400.0)
RBC: 3.58 Mil/uL — ABNORMAL LOW (ref 3.87–5.11)
RDW: 16.2 % — ABNORMAL HIGH (ref 11.5–15.5)
WBC: 6.5 10*3/uL (ref 4.0–10.5)

## 2020-02-17 LAB — RETICULOCYTES
ABS Retic: 53700 cells/uL (ref 20000–8000)
Retic Ct Pct: 1.5 %

## 2020-02-17 LAB — VITAMIN B12: Vitamin B-12: 348 pg/mL (ref 211–911)

## 2020-02-17 LAB — FOLATE: Folate: 15.1 ng/mL (ref >5.9)

## 2020-02-17 NOTE — Patient Instructions (Signed)
Your provider has requested that you go to the basement level for lab work before leaving today. Press "B" on the elevator. The lab is located at the first door on the left as you exit the elevator.   Due to recent changes in healthcare laws, you may see the results of your imaging and laboratory studies on MyChart before your provider has had a chance to review them.  We understand that in some cases there may be results that are confusing or concerning to you. Not all laboratory results come back in the same time frame and the provider may be waiting for multiple results in order to interpret others.  Please give us 48 hours in order for your provider to thoroughly review all the results before contacting the office for clarification of your results.    Thank you for choosing me and  Gastroenterology.  Dr. Mansouraty  

## 2020-02-18 ENCOUNTER — Other Ambulatory Visit: Payer: Self-pay

## 2020-02-18 DIAGNOSIS — D649 Anemia, unspecified: Secondary | ICD-10-CM

## 2020-02-18 NOTE — Progress Notes (Signed)
As long as the CT is reasonable I think we can do procedures in the [place].  We will see what it shows.  OK to schedule in [place] for now or per patient/son desire wait until completion of CT. Thanks. GM

## 2020-02-19 ENCOUNTER — Encounter: Payer: Self-pay | Admitting: Gastroenterology

## 2020-02-19 NOTE — Progress Notes (Signed)
[place] VISIT   Primary Care Provider Binnie Rail, MD [address] [place] [address] [phone]  Patient Profile: Gloria Warren is a 86 y.o. female with a pmh significant for endometriosis, prior skin cancer, hypertension, hyperlipidemia, psoriasis, Raynaud's disease, rheumatoid arthritis, prior UTIs, recent DVT and previously on anticoagulation now status post IVC filter and off anticoagulation, recent hospitalization for hematochezia and finding of possible rectal perforation treated medically, ?diverticulosis noted on imaging, hiatal hernia (imaging based).  The patient presents to the [facility] for an evaluation and management of problem(s) noted below:  Problem List 1. Rectal perforation ([place])   2. Anemia, unspecified type   3. Non-intractable vomiting with nausea, unspecified vomiting type   4. Abnormal CT of the abdomen     History of Present Illness Please see initial consultation note for full details of HPI.  Interval History After our last visit, we had initially considered moving forward with a CT abdomen/pelvis to reevaluate the previous findings of a rectal perforation.  However, the patient deferred on this.  Blood counts showed evidence of anemia.  Iron was initiated.  The patient and son returned today, and patient is feeling relatively stable if not slightly improved from her prior visit.  She has 1-2 bowel movements per day.  There is no evidence of any blood (melena/hematochezia/maroon color).  She is not having significant abdominal pain.  After the initiation of her PPI, her nausea and vomiting symptoms have significantly improved.  Her indigestion and heartburn are improved.  She is not having significant dysphagia or odynophagia.  She has been vaccinated against Covid.  She is unsure about wanting to have any sort of endoscopic evaluation once again.    GI Review of Systems Positive as  above Negative for nausea, vomiting, bloating, change in bowel habits  Review of Systems General: Denies fevers/chills/weight loss Cardiovascular: Denies chest pain/palpitations Pulmonary: Denies shortness of breath Gastroenterological: See HPI Genitourinary: Denies darkened urine Hematological: Easy bruising/bleeding remains an issue Endocrine: Denies temperature intolerance Dermatological: Denies jaundice Psychological: Mood is stable   Medications Current Outpatient Medications  Medication Sig Dispense Refill   albuterol (PROVENTIL) (2.5 MG/3ML) 0.083% nebulizer solution USE 1 VIAL VIA NEBULIZER EVERY 6 HOURS AS NEEDED 75 mL 1   benazepril (LOTENSIN) 10 MG tablet TAKE 1 TABLET(10 MG) BY MOUTH DAILY 90 tablet 1   Calcium Carbonate (CALTRATE 600 PO) Take 600 mg by mouth 2 (two) times daily.       docusate sodium (COLACE) 100 MG capsule Take 100 mg by mouth daily as needed for mild constipation.     doxycycline (VIBRA-TABS) 100 MG tablet Take 1 tablet (100 mg total) by mouth 2 (two) times daily. 10 tablet 0   DULoxetine (CYMBALTA) 30 MG capsule Take 30 mg by mouth daily.     escitalopram (LEXAPRO) 5 MG tablet Take 1 tablet (5 mg total) by mouth daily. 30 tablet 5   ferrous gluconate (FERGON) 324 MG tablet Take 1 tablet (324 mg total) by mouth daily with breakfast. 30 tablet 3   Fexofenadine HCl (ALLEGRA PO) Take 180 mg by mouth at bedtime.      furosemide (LASIX) 40 MG tablet Take 0.5 tablets (20 mg total) by mouth daily. 90 tablet 0   HYDROcodone-acetaminophen (NORCO/VICODIN) 5-325 MG tablet Take 1 tablet by mouth every 6 (six) hours as needed for moderate pain.      hydroxychloroquine (PLAQUENIL) 200 MG tablet Take 200 mg by mouth daily.  0   Multiple Vitamins-Minerals (VISION-VITE PRESERVE PO) Take by mouth.     omeprazole (PRILOSEC) 40 MG capsule Take 1 capsule (40 mg total) by mouth daily. 30 capsule 3   predniSONE (DELTASONE) 5 MG tablet Take 7.5 mg by mouth  daily with breakfast.      simethicone (MYLICON) 0000000 MG chewable tablet Chew 125 mg by mouth every 6 (six) hours as needed for flatulence.     No current facility-administered medications for this visit.    Allergies Allergies  Allergen Reactions   Arava [Leflunomide]     Liver & kidney dysfunction   Sulfonamide Derivatives     Itching & rash   Remicade [Infliximab] Hives and Itching    Histories Past Medical History:  Diagnosis Date   Allergic rhinitis    Decreased GFR    DVT (deep venous thrombosis) (HCC)    Endometriosis    History of skin cancer    squamous & basal cell ;Dr Denna Haggard   Hyperlipidemia    Hypertension    Osteoarthritis    Psoriasis    Raynaud disease    Rheumatoid arthritis(714.0)    Dr. Estanislado Pandy   UTI (lower urinary tract infection) 5/15   Proteus , R to Nitrofurantoin & Penicillin   Past Surgical History:  Procedure Laterality Date   APPENDECTOMY     age 17   CATARACT EXTRACTION Bilateral    COLONOSCOPY     Neg 2;  , [place]   G 2  P 2     TONSILLECTOMY     TOTAL ABDOMINAL HYSTERECTOMY     age 50 for endometriosis (no BSO)   VENA CAVA FILTER PLACEMENT N/A 10/22/2019   Procedure: INSERTION VENA-CAVA FILTER;  Surgeon: Marty Heck, MD;  Location: [facility] OR;  Service: Vascular;  Laterality: N/A;   VENOGRAM N/A 10/22/2019   Procedure: Annell Greening;  Surgeon: Marty Heck, MD;  Location: [facility] OR;  Service: Vascular;  Laterality: N/A;   Social History   Socioeconomic History   Marital status: Married    Spouse name: Not on file   Number of children: Not on file   Years of education: Not on file   Highest education level: Not on file  Occupational History   Not on file  Tobacco Use   Smoking status: Never Smoker   Smokeless tobacco: Never Used  Substance and Sexual Activity   Alcohol use: Not Currently    Alcohol/week: 0.0 standard drinks    Comment:  rarely   Drug use: No   Sexual activity:  Yes    Partners: Male    Birth control/protection: Surgical    Comment: TAH  Other Topics Concern   Not on file  Social History Narrative   Not on file   Social Determinants of Health   Financial Resource Strain:    Difficulty of Paying Living Expenses:   Food Insecurity:    Worried About Charity fundraiser in the Last Year:    Arboriculturist in the Last Year:   Transportation Needs:    Film/video editor (Medical):    Lack of Transportation (Non-Medical):   Physical Activity:    Days of Exercise per Week:    Minutes of Exercise per Session:   Stress:    Feeling of Stress :   Social Connections:    Frequency of Communication with Friends and Family:    Frequency of Social Gatherings with Friends and Family:    Attends Religious Services:  Active Member of Clubs or Organizations:    Attends Music therapist:    Marital Status:   Intimate Partner Violence:    Fear of Current or Ex-Partner:    Emotionally Abused:    Physically Abused:    Sexually Abused:    Family History  Problem Relation Age of Onset   Breast cancer Mother 52   Hypertension Mother    Gout Mother    Hypertension Father    Heart attack Father 4   Stroke Sister        doing well   Diabetes Maternal Aunt    Hypertension Son    Stroke Maternal Grandmother        late 54s   Colon cancer Neg Hx    Esophageal cancer Neg Hx    Inflammatory bowel disease Neg Hx    Liver disease Neg Hx    Pancreatic cancer Neg Hx    Rectal cancer Neg Hx    Stomach cancer Neg Hx    I have reviewed her medical, social, and family history in detail and updated the electronic medical record as necessary.    PHYSICAL EXAMINATION  BP 112/68 (BP Location: Right Arm, Patient Position: Sitting, Cuff Size: Normal)    Pulse 99    Temp (!) 97.5 F (36.4 C)    Ht 5' (1.524 m)    SpO2 98%    BMI 25.19 kg/m  Wt Readings from Last 3 Encounters:  10/20/19 129 lb (58.5 kg)   10/12/19 129 lb (58.5 kg)  09/24/19 129 lb (58.5 kg)  GEN: NAD, appears stated age, doesn't appear chronically ill, accompanied by son PSYCH: Cooperative, without pressured speech EYE: Conjunctivae pink, sclerae anicteric ENT: MMM CV: RR without R/Gs  RESP: No wheezing present GI: NABS, soft, NT/ND, without rebound or guarding MSK/EXT: Lower extremity edema present NEURO:  Alert & Oriented x 3, no focal deficits   REVIEW OF DATA  I reviewed the following data at the time of this encounter:  GI Procedures and Studies  No new studies to review  Laboratory Studies  Reviewed those in epic  Imaging Studies  No new studies to review   ASSESSMENT  Ms. Bahler is a 86 y.o. female with a pmh significant for endometriosis, prior skin cancer, hypertension, hyperlipidemia, psoriasis, Raynaud's disease, rheumatoid arthritis, prior UTIs, recent DVT and previously on anticoagulation now status post IVC filter and off anticoagulation, recent hospitalization for hematochezia and finding of possible rectal perforation treated medically, ?diverticulosis noted on imaging, hiatal hernia (imaging based).  The patient is seen today for evaluation and management of:  1. Rectal perforation ([place])   2. Anemia, unspecified type   3. Non-intractable vomiting with nausea, unspecified vomiting type   4. Abnormal CT of the abdomen    The patient is hemodynamically and clinically stable.  The patient deferred cross-sectional imaging due to concern of contrast ingestion.  We discussed the role of cross-sectional imaging to ensure that everything is as healed as possible and it is the least invasive of all potential procedures that could be entertained.  She still wants to hold on that at this time.  I do think that we need to better understand her anemia and ensure that she does not have a persistent iron deficiency.  If she does then there is even more reason that we need to consider endoscopic evaluation with  an upper and lower endoscopy.  If she has iron deficiency and anemia, then we will need to likely  get her started with IV iron infusions as she is already taking oral iron.  As well, we would consider an endoscopic evaluation but she needs to have cross-sectional imaging performed before that.  We will see how her labs look and then decide further work-up based on that.  I remain concerned about the potential of an underlying process in the rectum, however, as the patient is doing and feeling better no longer having rectal bleeding or pain then maybe this was all a stercoral ulceration but it would not explain a persistent anemia.  The patient seems to be doing well with initiation of PPI and will maintain that for now.  All patient questions were answered, to the best of my ability, and the patient agrees to the aforementioned plan of action with follow-up as indicated.   PLAN  Continue PPI once daily Laboratories as outlined below to evaluate for anemia and iron deficiency Recommend a CT abdomen/pelvis with IV and oral contrast-patient deferred Recommend endoscopic evaluation-patient deferred Depending on findings above if she has iron deficiency and anemia and really strongly recommend endoscopic evaluation with an upper and lower scope   Orders Placed This Encounter  Procedures   IBC + Ferritin   Reticulocytes   B12   Folate   CBC    New Prescriptions   No medications on file   Modified Medications   No medications on file    Planned Follow Up No follow-ups on file.   Total Time in Face-to-Face and in Coordination of Care for patient including independent/personal interpretation/review of prior testing, medical history, examination, medication adjustment, communicating results with the patient directly, and documentation with the EHR is 25 minutes.   Justice Britain, MD [facility] # [id number]

## 2020-02-24 DIAGNOSIS — K9049 Malabsorption due to intolerance, not elsewhere classified: Secondary | ICD-10-CM | POA: Diagnosis not present

## 2020-02-24 DIAGNOSIS — D509 Iron deficiency anemia, unspecified: Secondary | ICD-10-CM | POA: Diagnosis not present

## 2020-02-26 ENCOUNTER — Ambulatory Visit (HOSPITAL_COMMUNITY): Admission: RE | Admit: 2020-02-26 | Payer: Medicare Other | Source: Ambulatory Visit

## 2020-03-02 DIAGNOSIS — K9049 Malabsorption due to intolerance, not elsewhere classified: Secondary | ICD-10-CM | POA: Diagnosis not present

## 2020-03-02 DIAGNOSIS — D509 Iron deficiency anemia, unspecified: Secondary | ICD-10-CM | POA: Diagnosis not present

## 2020-03-03 ENCOUNTER — Telehealth: Payer: Self-pay | Admitting: Internal Medicine

## 2020-03-03 NOTE — Telephone Encounter (Signed)
Spoke with son and advised we can give the pneumonia shot to pt as long as it is 2 weeks after the COVID shot.

## 2020-03-03 NOTE — Telephone Encounter (Signed)
New message:   Pt's son is calling and would like the pt to get her pneumonia shot when she is here next week. He also would like a call to discuss something the [facility] doctor has suggested. Please advise.

## 2020-03-07 NOTE — Progress Notes (Signed)
Subjective:    Patient ID: Gloria Warren, female    DOB: [birth year], 86 y.o.   MRN: [id number]  HPI The patient is here for follow up of their chronic medical problems, including hypertension, depression, CKD, leg edema, prediabetes, RA, iron def anemia.  She is here with her son.    Not sleeping well due to severe RA pain.  She has never slept well, but it is worse now.  She typically does not nap during the day.   She is eating pretty well.     Medications and allergies reviewed with patient and updated if appropriate.  Patient Active Problem List   Diagnosis Date Noted  . Infected ulcer of skin ([place]) 12/14/2019  . Sacral ulcer ([place]) 12/14/2019  . Abnormal CT of the abdomen 12/02/2019  . Rectal perforation ([place]) 12/02/2019  . Anemia 12/02/2019  . Non-intractable vomiting 12/02/2019  . History of rectal bleeding 12/02/2019  . Acute lower GI bleeding 10/22/2019  . Depression 10/22/2019  . COVID-19 10/21/2019  . DVT (deep venous thrombosis) ([place]) 10/12/2019  . Pain and swelling of right lower leg 09/24/2019  . Nausea 06/29/2019  . Vitamin D deficiency 06/29/2019  . Osteoarthritis of both feet 09/25/2016  . Raynaud disease 09/25/2016  . Primary osteoarthritis of both knees 09/24/2016  . Primary osteoarthritis of both hands 09/24/2016  . Chronic kidney disease (CKD) stage G3b 09/24/2016  . Leg edema 11/28/2015  . Psoriasis 03/11/2013  . SKIN CANCER, HX OF 06/26/2010  . Allergic rhinitis 06/23/2009  . Prediabetes 06/23/2009  . Rheumatoid arthritis ([place]) 02/25/2008  . Hyperlipidemia 11/17/2007  . Essential hypertension 11/17/2007    Current Outpatient Medications on File Prior to Visit  Medication Sig Dispense Refill  . albuterol (PROVENTIL) (2.5 MG/3ML) 0.083% nebulizer solution USE 1 VIAL VIA NEBULIZER EVERY 6 HOURS AS NEEDED 75 mL 1  . benazepril (LOTENSIN) 10 MG tablet TAKE 1 TABLET(10 MG) BY MOUTH DAILY 90 tablet 1  . Calcium Carbonate (CALTRATE 600 PO) Take  600 mg by mouth 2 (two) times daily.      . cetirizine (ZYRTEC) 10 MG tablet Take 10 mg by mouth daily.    Marland Kitchen docusate sodium (COLACE) 100 MG capsule Take 100 mg by mouth daily as needed for mild constipation.    . DULoxetine (CYMBALTA) 30 MG capsule Take 30 mg by mouth daily.    Marland Kitchen escitalopram (LEXAPRO) 5 MG tablet Take 1 tablet (5 mg total) by mouth daily. 30 tablet 5  . ferrous gluconate (FERGON) 324 MG tablet Take 1 tablet (324 mg total) by mouth daily with breakfast. 30 tablet 3  . Fexofenadine HCl (ALLEGRA PO) Take 180 mg by mouth at bedtime.     . furosemide (LASIX) 40 MG tablet Take 0.5 tablets (20 mg total) by mouth daily. 90 tablet 0  . HYDROcodone-acetaminophen (NORCO/VICODIN) 5-325 MG tablet Take 1 tablet by mouth every 6 (six) hours as needed for moderate pain.     . hydroxychloroquine (PLAQUENIL) 200 MG tablet Take 200 mg by mouth daily.   0  . Multiple Vitamins-Minerals (VISION-VITE PRESERVE PO) Take by mouth.    Marland Kitchen omeprazole (PRILOSEC) 40 MG capsule Take 1 capsule (40 mg total) by mouth daily. 30 capsule 3  . predniSONE (DELTASONE) 5 MG tablet Take 7.5 mg by mouth daily with breakfast.     . promethazine (PHENERGAN) 12.5 MG tablet     . simethicone (MYLICON) 0000000 MG chewable tablet Chew 125 mg by mouth every 6 (six) hours  as needed for flatulence.     No current facility-administered medications on file prior to visit.    Past Medical History:  Diagnosis Date  . Allergic rhinitis   . Decreased GFR   . DVT (deep venous thrombosis) ([place])   . Endometriosis   . History of skin cancer    squamous & basal cell ;Dr Denna Haggard  . Hyperlipidemia   . Hypertension   . Osteoarthritis   . Psoriasis   . Raynaud disease   . Rheumatoid arthritis(714.0)    Dr. Estanislado Pandy  . UTI (lower urinary tract infection) 5/15   Proteus , R to Nitrofurantoin & Penicillin    Past Surgical History:  Procedure Laterality Date  . APPENDECTOMY     age 57  . CATARACT EXTRACTION Bilateral   .  COLONOSCOPY     Neg 2; [place] , [place]  . G 2  P 2    . TONSILLECTOMY    . TOTAL ABDOMINAL HYSTERECTOMY     age 37 for endometriosis (no BSO)  . VENA CAVA FILTER PLACEMENT N/A 10/22/2019   Procedure: INSERTION VENA-CAVA FILTER;  Surgeon: Marty Heck, MD;  Location: [place];  Service: Vascular;  Laterality: N/A;  . VENOGRAM N/A 10/22/2019   Procedure: Venogram;  Surgeon: Marty Heck, MD;  Location: [facility];  Service: Vascular;  Laterality: N/A;    Social History   Socioeconomic History  . Marital status: Married    Spouse name: Not on file  . Number of children: Not on file  . Years of education: Not on file  . Highest education level: Not on file  Occupational History  . Not on file  Tobacco Use  . Smoking status: Never Smoker  . Smokeless tobacco: Never Used  Substance and Sexual Activity  . Alcohol use: Not Currently    Alcohol/week: 0.0 standard drinks    Comment:  rarely  . Drug use: No  . Sexual activity: Yes    Partners: Male    Birth control/protection: Surgical    Comment: TAH  Other Topics Concern  . Not on file  Social History Narrative  . Not on file   Social Determinants of Health   Financial Resource Strain:   . Difficulty of Paying Living Expenses:   Food Insecurity:   . Worried About Charity fundraiser in the Last Year:   . Arboriculturist in the Last Year:   Transportation Needs:   . Film/video editor (Medical):   Marland Kitchen Lack of Transportation (Non-Medical):   Physical Activity:   . Days of Exercise per Week:   . Minutes of Exercise per Session:   Stress:   . Feeling of Stress :   Social Connections:   . Frequency of Communication with Friends and Family:   . Frequency of Social Gatherings with Friends and Family:   . Attends Religious Services:   . Active Member of Clubs or Organizations:   . Attends Archivist Meetings:   Marland Kitchen Marital Status:     Family History  Problem Relation Age of Onset  . Breast cancer  Mother 33  . Hypertension Mother   . Gout Mother   . Hypertension Father   . Heart attack Father 38  . Stroke Sister        doing well  . Diabetes Maternal Aunt   . Hypertension Son   . Stroke Maternal Grandmother        late 29s  . Colon cancer Neg  Hx   . Esophageal cancer Neg Hx   . Inflammatory bowel disease Neg Hx   . Liver disease Neg Hx   . Pancreatic cancer Neg Hx   . Rectal cancer Neg Hx   . Stomach cancer Neg Hx     Review of Systems  Constitutional: Negative for fever.  Respiratory: Positive for cough and wheezing. Negative for shortness of breath.   Cardiovascular: Positive for leg swelling (chronic). Negative for chest pain and palpitations.  Gastrointestinal: Negative for abdominal pain, blood in stool, constipation and diarrhea.       Gerd controlled  Neurological: Positive for dizziness (occ). Negative for headaches.       Objective:   Vitals:   03/08/20 1342  BP: 112/76  Pulse: 86  Resp: 16  Temp: 98.4 F (36.9 C)  SpO2: 98%   BP Readings from Last 3 Encounters:  03/08/20 112/76  03/08/20 112/76  02/17/20 112/68   Wt Readings from Last 3 Encounters:  03/08/20 129 lb (58.5 kg)  10/20/19 129 lb (58.5 kg)  10/12/19 129 lb (58.5 kg)   Body mass index is 25.19 kg/m.   Physical Exam    Constitutional: Appears well-developed and well-nourished. No distress.  HENT:  Head: Normocephalic and atraumatic.  Neck: Neck supple. No tracheal deviation present. No thyromegaly present.  No cervical lymphadenopathy Cardiovascular: Normal rate, regular rhythm and normal heart sounds.   No murmur heard. No carotid bruit .  2 + b/l LE edema  - wearing compression socks Pulmonary/Chest: Effort normal and breath sounds normal. No respiratory distress. No has no wheezes. No rales.  Skin: Skin is warm and dry. Not diaphoretic.  Psychiatric: Normal mood and affect. Behavior is normal.      Assessment & Plan:    See Problem List for Assessment and Plan of  chronic medical problems.    This visit occurred during the SARS-CoV-2 public health emergency.  Safety protocols were in place, including screening questions prior to the visit, additional usage of staff PPE, and extensive cleaning of exam room while observing appropriate contact time as indicated for disinfecting solutions.

## 2020-03-07 NOTE — Patient Instructions (Addendum)
    Medications reviewed and updated.  Changes include :  Decrease benazepril to 5 mg daily    Your prescription(s) have been submitted to your pharmacy. Please take as directed and contact our office if you believe you are having problem(s) with the medication(s).   Please followup in 6 months

## 2020-03-08 ENCOUNTER — Encounter: Payer: Self-pay | Admitting: Internal Medicine

## 2020-03-08 ENCOUNTER — Ambulatory Visit (INDEPENDENT_AMBULATORY_CARE_PROVIDER_SITE_OTHER): Payer: Medicare Other | Admitting: Internal Medicine

## 2020-03-08 ENCOUNTER — Other Ambulatory Visit: Payer: Self-pay

## 2020-03-08 ENCOUNTER — Ambulatory Visit (INDEPENDENT_AMBULATORY_CARE_PROVIDER_SITE_OTHER): Payer: Medicare Other

## 2020-03-08 VITALS — BP 112/76 | HR 86 | Temp 98.4°F | Resp 16 | Ht 60.0 in

## 2020-03-08 VITALS — BP 112/76 | HR 86 | Temp 98.4°F | Resp 16 | Ht 60.0 in | Wt 129.0 lb

## 2020-03-08 DIAGNOSIS — Z0000 Encounter for general adult medical examination without abnormal findings: Secondary | ICD-10-CM | POA: Diagnosis not present

## 2020-03-08 DIAGNOSIS — M0579 Rheumatoid arthritis with rheumatoid factor of multiple sites without organ or systems involvement: Secondary | ICD-10-CM | POA: Diagnosis not present

## 2020-03-08 DIAGNOSIS — N1832 Chronic kidney disease, stage 3b: Secondary | ICD-10-CM

## 2020-03-08 DIAGNOSIS — K219 Gastro-esophageal reflux disease without esophagitis: Secondary | ICD-10-CM | POA: Diagnosis not present

## 2020-03-08 DIAGNOSIS — R600 Localized edema: Secondary | ICD-10-CM

## 2020-03-08 DIAGNOSIS — R7303 Prediabetes: Secondary | ICD-10-CM | POA: Diagnosis not present

## 2020-03-08 DIAGNOSIS — F3289 Other specified depressive episodes: Secondary | ICD-10-CM

## 2020-03-08 DIAGNOSIS — R2681 Unsteadiness on feet: Secondary | ICD-10-CM | POA: Diagnosis not present

## 2020-03-08 DIAGNOSIS — I82411 Acute embolism and thrombosis of right femoral vein: Secondary | ICD-10-CM | POA: Diagnosis not present

## 2020-03-08 DIAGNOSIS — M25561 Pain in right knee: Secondary | ICD-10-CM | POA: Diagnosis not present

## 2020-03-08 DIAGNOSIS — D649 Anemia, unspecified: Secondary | ICD-10-CM

## 2020-03-08 DIAGNOSIS — M6281 Muscle weakness (generalized): Secondary | ICD-10-CM | POA: Diagnosis not present

## 2020-03-08 DIAGNOSIS — I10 Essential (primary) hypertension: Secondary | ICD-10-CM

## 2020-03-08 MED ORDER — BENAZEPRIL HCL 10 MG PO TABS: 5.0000 mg | ORAL_TABLET | Freq: Every day | ORAL | 1 refills | Status: DC

## 2020-03-08 MED ORDER — FUROSEMIDE 20 MG PO TABS: 20.0000 mg | ORAL_TABLET | Freq: Every day | ORAL | 3 refills | Status: DC

## 2020-03-08 NOTE — Assessment & Plan Note (Signed)
Chronic  Lab Results  Component Value Date   HGBA1C 5.8 06/29/2019   Has been controlled -will check a1c at next visit

## 2020-03-08 NOTE — Assessment & Plan Note (Signed)
Had lower GIB when on a/c Still with chronic anemia - likely  Likely related to GI bleed, RA Received 2 iron infusions Will need cbc, iron panel in a couple of weeks

## 2020-03-08 NOTE — Assessment & Plan Note (Signed)
Chronic Stable Wears compression socks daily Taking lasix

## 2020-03-08 NOTE — Patient Instructions (Addendum)
Gloria Warren , Thank you for taking time to come for your Medicare Wellness Visit. I appreciate your ongoing commitment to your health goals. Please review the following plan we discussed and let me know if I can assist you in the future.   Screening recommendations/referrals: Colorectal Screening: not recommended due to age 86: last done on 01/31/2016 Bone Density: last done on 12/01/2012  Vision and Dental Exams: Recommended annual ophthalmology exams for early detection of glaucoma and other disorders of the eye Recommended annual dental exams for proper oral hygiene  Vaccinations: Influenza vaccine: 08/21/2019 Pneumococcal vaccine: Pneumovax23 done on 11/01/2011; need Prevnar13 Tdap vaccine: declined  Zoster vaccine: 09/18/2007 Shingles vaccine: Please call your insurance company to determine your out of pocket expense for the Shingrix vaccine. You may receive this vaccine at your local pharmacy.  Advanced directives: Advance directives discussed with you today. Please bring a copy of your POA (Power of [place]) and/or Living Will to your next appointment.  Goals:  Recommend to drink at least 6-8 8oz glasses of water per day.  Recommend to exercise for at least 150 minutes per week.  Recommend to remove any items from the home that may cause slips or trips.  Recommend to decrease portion sizes by eating 3 small healthy meals and at least 2 healthy snacks per day.  Recommend to begin DASH diet as directed below.  Recommend to continue efforts to reduce smoking habits until no longer smoking. Smoking Cessation literature is attached below.  Next appointment: Please schedule your Annual Wellness Visit with your Nurse Health Advisor in one year.  Preventive Care 70 Years and Older, Female Preventive care refers to lifestyle choices and visits with your health care provider that can promote health and wellness. What does preventive care include?  A yearly physical exam.  This is also called an annual well check.  Dental exams once or twice a year.  Routine eye exams. Ask your health care provider how often you should have your eyes checked.  Personal lifestyle choices, including:  Daily care of your teeth and gums.  Regular physical activity.  Eating a healthy diet.  Avoiding tobacco and drug use.  Limiting alcohol use.  Practicing safe sex.  Taking low-dose aspirin every day if recommended by your health care provider.  Taking vitamin and mineral supplements as recommended by your health care provider. What happens during an annual well check? The services and screenings done by your health care provider during your annual well check will depend on your age, overall health, lifestyle risk factors, and family history of disease. Counseling  Your health care provider may ask you questions about your:  Alcohol use.  Tobacco use.  Drug use.  Emotional well-being.  Home and relationship well-being.  Sexual activity.  Eating habits.  History of falls.  Memory and ability to understand (cognition).  Work and work Statistician.  Reproductive health. Screening  You may have the following tests or measurements:  Height, weight, and BMI.  Blood pressure.  Lipid and cholesterol levels. These may be checked every 5 years, or more frequently if you are over 58 years old.  Skin check.  Lung cancer screening. You may have this screening every year starting at age 9 if you have a 30-pack-year history of smoking and currently smoke or have quit within the past 15 years.  Fecal occult blood test (FOBT) of the stool. You may have this test every year starting at age 75.  Flexible sigmoidoscopy or colonoscopy. You may  have a sigmoidoscopy every 5 years or a colonoscopy every 10 years starting at age 56.  Hepatitis C blood test.  Hepatitis B blood test.  Sexually transmitted disease (STD) testing.  Diabetes screening. This is done  by checking your blood sugar (glucose) after you have not eaten for a while (fasting). You may have this done every 1-3 years.  Bone density scan. This is done to screen for osteoporosis. You may have this done starting at age 13.  Mammogram. This may be done every 1-2 years. Talk to your health care provider about how often you should have regular mammograms. Talk with your health care provider about your test results, treatment options, and if necessary, the need for more tests. Vaccines  Your health care provider may recommend certain vaccines, such as:  Influenza vaccine. This is recommended every year.  Tetanus, diphtheria, and acellular pertussis (Tdap, Td) vaccine. You may need a Td booster every 10 years.  Zoster vaccine. You may need this after age 21.  Pneumococcal 13-valent conjugate (PCV13) vaccine. One dose is recommended after age 52.  Pneumococcal polysaccharide (PPSV23) vaccine. One dose is recommended after age 3. Talk to your health care provider about which screenings and vaccines you need and how often you need them. This information is not intended to replace advice given to you by your health care provider. Make sure you discuss any questions you have with your health care provider. Document Released: 11/25/2015 Document Revised: 07/18/2016 Document Reviewed: 08/30/2015 Elsevier Interactive Patient Education  2017 [place] Prevention in the Home Falls can cause injuries. They can happen to people of all ages. There are many things you can do to make your home safe and to help prevent falls. What can I do on the outside of my home?  Regularly fix the edges of walkways and driveways and fix any cracks.  Remove anything that might make you trip as you walk through a door, such as a raised step or threshold.  Trim any bushes or trees on the path to your home.  Use bright outdoor lighting.  Clear any walking paths of anything that might make someone  trip, such as rocks or tools.  Regularly check to see if handrails are loose or broken. Make sure that both sides of any steps have handrails.  Any raised decks and porches should have guardrails on the edges.  Have any leaves, snow, or ice cleared regularly.  Use sand or salt on walking paths during winter.  Clean up any spills in your garage right away. This includes oil or grease spills. What can I do in the bathroom?  Use night lights.  Install grab bars by the toilet and in the tub and shower. Do not use towel bars as grab bars.  Use non-skid mats or decals in the tub or shower.  If you need to sit down in the shower, use a plastic, non-slip stool.  Keep the floor dry. Clean up any water that spills on the floor as soon as it happens.  Remove soap buildup in the tub or shower regularly.  Attach bath mats securely with double-sided non-slip rug tape.  Do not have throw rugs and other things on the floor that can make you trip. What can I do in the bedroom?  Use night lights.  Make sure that you have a light by your bed that is easy to reach.  Do not use any sheets or blankets that are too big for your  bed. They should not hang down onto the floor.  Have a firm chair that has side arms. You can use this for support while you get dressed.  Do not have throw rugs and other things on the floor that can make you trip. What can I do in the kitchen?  Clean up any spills right away.  Avoid walking on wet floors.  Keep items that you use a lot in easy-to-reach places.  If you need to reach something above you, use a strong step stool that has a grab bar.  Keep electrical cords out of the way.  Do not use floor polish or wax that makes floors slippery. If you must use wax, use non-skid floor wax.  Do not have throw rugs and other things on the floor that can make you trip. What can I do with my stairs?  Do not leave any items on the stairs.  Make sure that there  are handrails on both sides of the stairs and use them. Fix handrails that are broken or loose. Make sure that handrails are as long as the stairways.  Check any carpeting to make sure that it is firmly attached to the stairs. Fix any carpet that is loose or worn.  Avoid having throw rugs at the top or bottom of the stairs. If you do have throw rugs, attach them to the floor with carpet tape.  Make sure that you have a light switch at the top of the stairs and the bottom of the stairs. If you do not have them, ask someone to add them for you. What else can I do to help prevent falls?  Wear shoes that:  Do not have high heels.  Have rubber bottoms.  Are comfortable and fit you well.  Are closed at the toe. Do not wear sandals.  If you use a stepladder:  Make sure that it is fully opened. Do not climb a closed stepladder.  Make sure that both sides of the stepladder are locked into place.  Ask someone to hold it for you, if possible.  Clearly mark and make sure that you can see:  Any grab bars or handrails.  First and last steps.  Where the edge of each step is.  Use tools that help you move around (mobility aids) if they are needed. These include:  Canes.  Walkers.  Scooters.  Crutches.  Turn on the lights when you go into a dark area. Replace any light bulbs as soon as they burn out.  Set up your furniture so you have a clear path. Avoid moving your furniture around.  If any of your floors are uneven, fix them.  If there are any pets around you, be aware of where they are.  Review your medicines with your doctor. Some medicines can make you feel dizzy. This can increase your chance of falling. Ask your doctor what other things that you can do to help prevent falls. This information is not intended to replace advice given to you by your health care provider. Make sure you discuss any questions you have with your health care provider. Document Released:  08/25/2009 Document Revised: 04/05/2016 Document Reviewed: 12/03/2014 Elsevier Interactive Patient Education  2017 [employer].

## 2020-03-08 NOTE — Assessment & Plan Note (Signed)
S/p IVC filter Was on a/c, but stopped due to GIB

## 2020-03-08 NOTE — Assessment & Plan Note (Signed)
Chronic GERD controlled Continue daily medication  

## 2020-03-08 NOTE — Assessment & Plan Note (Signed)
Chronic Controlled, stable Continue current dose of medication lexapro 5 mg daily

## 2020-03-08 NOTE — Assessment & Plan Note (Signed)
Chronic Stable Will dec benazepril to 5 mg since BP so well controlled Continue lasix 20 mg daily

## 2020-03-08 NOTE — Progress Notes (Signed)
Subjective:   Gloria Warren is a 86 y.o. female who presents for Medicare Annual (Subsequent) preventive examination.  Review of Systems:  No ROS Medicare Wellness Visit Cardiac Risk Factors include: advanced age (>36men, >84 women);dyslipidemia Sleep Patterns: No issues with falling sleep; sleeps 2-3 hours per night. Home Safety/Smoke Alarms: Feels safe in home; Smoke alarms in place. Living environment:1-story home; has assistants during the day and night. Seat Belt Safety/Bike Helmet: Wears seat belt.    Objective:     Vitals: BP 112/76 (BP Location: Right Arm, Patient Position: Sitting, Cuff Size: Normal)   Pulse 86   Temp 98.4 F (36.9 C) (Temporal)   Resp 16   Ht 5' (1.524 m)   Wt 129 lb (58.5 kg)   SpO2 98%   BMI 25.19 kg/m   Body mass index is 25.19 kg/m.  Advanced Directives 03/08/2020 12/09/2016 12/09/2016 07/19/2014  Does Patient Have a Medical Advance Directive? Yes Yes Yes No  Type of Advance Directive Living will;Healthcare Power of [place];Living will [place];Living will -  Does patient want to make changes to medical advance directive? No - Patient declined No - Patient declined - -  Copy of [employer] in Chart? No - copy requested No - copy requested No - copy requested -  Would patient like information on creating a medical advance directive? - No - Patient declined No - Patient declined -    Tobacco Social History   Tobacco Use  Smoking Status Never Smoker  Smokeless Tobacco Never Used     Counseling given: No   Clinical Intake:  Pre-visit preparation completed: Yes  Pain : 0-10 Pain Score: 0-No pain(in pain for the last 30 years) Pain Type: Chronic pain(Rheumatoid Arthritis) Pain Onset: Other (comment)(rheumatoid arthritis) Pain Frequency: Constant     Nutritional Risks: Nausea/ vomitting/ diarrhea(anti-nausea medication) Diabetes: No     Interpreter Needed?:  No  Information entered by :: Jennell Janosik N. Lowell Guitar, LPN  Past Medical History:  Diagnosis Date  . Allergic rhinitis   . Decreased GFR   . DVT (deep venous thrombosis) ([place])   . Endometriosis   . History of skin cancer    squamous & basal cell ;Dr Denna Haggard  . Hyperlipidemia   . Hypertension   . Osteoarthritis   . Psoriasis   . Raynaud disease   . Rheumatoid arthritis(714.0)    Dr. Estanislado Pandy  . UTI (lower urinary tract infection) 5/15   Proteus , R to Nitrofurantoin & Penicillin   Past Surgical History:  Procedure Laterality Date  . APPENDECTOMY     age 36  . CATARACT EXTRACTION Bilateral   . COLONOSCOPY     Neg 2; [place] , [place]  . G 2  P 2    . TONSILLECTOMY    . TOTAL ABDOMINAL HYSTERECTOMY     age 84 for endometriosis (no BSO)  . VENA CAVA FILTER PLACEMENT N/A 10/22/2019   Procedure: INSERTION VENA-CAVA FILTER;  Surgeon: Marty Heck, MD;  Location: [facility] OR;  Service: Vascular;  Laterality: N/A;  . VENOGRAM N/A 10/22/2019   Procedure: Venogram;  Surgeon: Marty Heck, MD;  Location: [facility] OR;  Service: Vascular;  Laterality: N/A;   Family History  Problem Relation Age of Onset  . Breast cancer Mother 88  . Hypertension Mother   . Gout Mother   . Hypertension Father   . Heart attack Father 51  . Stroke Sister  doing well  . Diabetes Maternal Aunt   . Hypertension Son   . Stroke Maternal Grandmother        late 76s  . Colon cancer Neg Hx   . Esophageal cancer Neg Hx   . Inflammatory bowel disease Neg Hx   . Liver disease Neg Hx   . Pancreatic cancer Neg Hx   . Rectal cancer Neg Hx   . Stomach cancer Neg Hx    Social History   Socioeconomic History  . Marital status: Married    Spouse name: Not on file  . Number of children: Not on file  . Years of education: Not on file  . Highest education level: Not on file  Occupational History  . Not on file  Tobacco Use  . Smoking status: Never Smoker  . Smokeless tobacco: Never Used   Substance and Sexual Activity  . Alcohol use: Not Currently    Alcohol/week: 0.0 standard drinks    Comment:  rarely  . Drug use: No  . Sexual activity: Yes    Partners: Male    Birth control/protection: Surgical    Comment: TAH  Other Topics Concern  . Not on file  Social History Narrative  . Not on file   Social Determinants of Health   Financial Resource Strain:   . Difficulty of Paying Living Expenses:   Food Insecurity:   . Worried About Charity fundraiser in the Last Year:   . Arboriculturist in the Last Year:   Transportation Needs:   . Film/video editor (Medical):   Marland Kitchen Lack of Transportation (Non-Medical):   Physical Activity:   . Days of Exercise per Week:   . Minutes of Exercise per Session:   Stress:   . Feeling of Stress :   Social Connections:   . Frequency of Communication with Friends and Family:   . Frequency of Social Gatherings with Friends and Family:   . Attends Religious Services:   . Active Member of Clubs or Organizations:   . Attends Archivist Meetings:   Marland Kitchen Marital Status:     Outpatient Encounter Medications as of 03/08/2020  Medication Sig  . albuterol (PROVENTIL) (2.5 MG/3ML) 0.083% nebulizer solution USE 1 VIAL VIA NEBULIZER EVERY 6 HOURS AS NEEDED  . benazepril (LOTENSIN) 10 MG tablet TAKE 1 TABLET(10 MG) BY MOUTH DAILY  . Calcium Carbonate (CALTRATE 600 PO) Take 600 mg by mouth 2 (two) times daily.    . cetirizine (ZYRTEC) 10 MG tablet Take 10 mg by mouth daily.  . DULoxetine (CYMBALTA) 30 MG capsule Take 30 mg by mouth daily.  Marland Kitchen escitalopram (LEXAPRO) 5 MG tablet Take 1 tablet (5 mg total) by mouth daily.  . ferrous gluconate (FERGON) 324 MG tablet Take 1 tablet (324 mg total) by mouth daily with breakfast.  . furosemide (LASIX) 40 MG tablet Take 0.5 tablets (20 mg total) by mouth daily.  Marland Kitchen HYDROcodone-acetaminophen (NORCO/VICODIN) 5-325 MG tablet Take 1 tablet by mouth every 6 (six) hours as needed for moderate pain.    . hydroxychloroquine (PLAQUENIL) 200 MG tablet Take 200 mg by mouth daily.   . Multiple Vitamins-Minerals (VISION-VITE PRESERVE PO) Take by mouth.  Marland Kitchen omeprazole (PRILOSEC) 40 MG capsule Take 1 capsule (40 mg total) by mouth daily.  . predniSONE (DELTASONE) 5 MG tablet Take 7.5 mg by mouth daily with breakfast.   . promethazine (PHENERGAN) 12.5 MG tablet   . simethicone (MYLICON) 0000000 MG chewable tablet Chew 125  mg by mouth every 6 (six) hours as needed for flatulence.  . docusate sodium (COLACE) 100 MG capsule Take 100 mg by mouth daily as needed for mild constipation.  Marland Kitchen Fexofenadine HCl (ALLEGRA PO) Take 180 mg by mouth at bedtime.    No facility-administered encounter medications on file as of 03/08/2020.    Activities of Daily Living In your present state of health, do you have any difficulty performing the following activities: 03/08/2020  Hearing? Y  Vision? N  Comment wears glasses  Difficulty concentrating or making decisions? Y  Walking or climbing stairs? Y  Dressing or bathing? Y  Doing errands, shopping? Y  Preparing Food and eating ? Y  Using the Toilet? Y  In the past six months, have you accidently leaked urine? Y  Do you have problems with loss of bowel control? N  Managing your Medications? Y  Managing your Finances? Y  Housekeeping or managing your Housekeeping? Y  Some recent data might be hidden    Patient Care Team: Binnie Rail, MD as PCP - General (Internal Medicine) Sanda Klein, MD as PCP - Cardiology (Cardiology)    Assessment:   This is a routine wellness examination for [place].  Exercise Activities and Dietary recommendations Current Exercise Habits: The patient does not participate in regular exercise at present, Exercise limited by: Other - see comments(rheumatoid arthritis)  Goals   None     Fall Risk Fall Risk  03/08/2020 06/10/2019 09/04/2017 05/28/2016 03/11/2013  Falls in the past year? 0 0 No No No  Comment - Emmi Telephone Survey:  data to providers prior to load - - -  Number falls in past yr: 0 - - - -  Injury with Fall? 0 - - - -  Risk for fall due to : Impaired balance/gait;Impaired mobility - - - -  Follow up Falls evaluation completed;Education provided;Falls prevention discussed - - - -   Is the patient's home free of loose throw rugs in walkways, pet beds, electrical cords, etc?   yes      Grab bars in the bathroom? yes      Handrails on the stairs?   yes      Adequate lighting?   yes  Depression Screen PHQ 2/9 Scores 03/08/2020 06/29/2019 09/04/2017 05/28/2016  PHQ - 2 Score 0 0 0 0           Immunization History  Administered Date(s) Administered  . Fluad Quad(high Dose 65+) 08/21/2019  . Influenza Split 08/08/2012  . Influenza Whole 09/09/2007  . Influenza, High Dose Seasonal PF 08/15/2015, 07/31/2016, 08/14/2017, 08/14/2018  . Influenza,inj,Quad PF,6+ Mos 08/07/2013, 08/16/2014  . Pneumococcal Polysaccharide-23 11/01/2011  . Tetanus 03/24/2014  . Zoster 09/18/2007    Qualifies for Shingles Vaccine? Yes, will check with local pharmacy  Screening Tests Health Maintenance  Topic Date Due  . COVID-19 Vaccine (1) Never done  . PNA vac Low Risk Adult (2 of 2 - PCV13) 12/13/2020 (Originally 10/31/2012)  . INFLUENZA VACCINE  06/12/2020  . TETANUS/TDAP  03/24/2024  . DEXA SCAN  Completed    Cancer Screenings: Lung: Low Dose CT Chest recommended if Age 9-80 years, 30 pack-year currently smoking OR have quit w/in 15years. Patient does not qualify. Breast:  Up to date on Mammogram? Yes; not recommended due to age Up to date of Bone Density/Dexa? Yes; not recommended due to age Colorectal: not recommended due to age     Plan:     Reviewed health maintenance screenings with patient  today and relevant education, vaccines, and/or referrals were provided.    Continue doing brain stimulating activities (puzzles, reading, adult coloring books, staying active) to keep memory sharp.    Continue to  eat heart healthy diet (full of fruits, vegetables, whole grains, lean protein, water--limit salt, fat, and sugar intake) and increase physical activity as tolerated.   I have personally reviewed and noted the following in the patient's chart:   . Medical and social history . Use of alcohol, tobacco or illicit drugs  . Current medications and supplements . Functional ability and status . Nutritional status . Physical activity . Advanced directives . List of other physicians . Hospitalizations, surgeries, and ER visits in previous 12 months . Vitals . Screenings to include cognitive, depression, and falls . Referrals and appointments  In addition, I have reviewed and discussed with patient certain preventive protocols, quality metrics, and best practice recommendations. A written personalized care plan for preventive services as well as general preventive health recommendations were provided to patient.     Sheral Flow, LPN  [phone]  Nurse Health Advisor

## 2020-03-08 NOTE — Assessment & Plan Note (Addendum)
Chronic BP well controlled - on lower side Will try decreasing lotensin to 5 mg daily

## 2020-03-11 ENCOUNTER — Telehealth: Payer: Self-pay | Admitting: Gastroenterology

## 2020-03-11 NOTE — Telephone Encounter (Signed)
The pt son was advised that we do not have any test scheduled for the pt.  We do have an office visit set up however.  I believe there was some miscommunication.  The pt thanked me for calling and wanted to confirm that the pt does not wish to have any testing at this time.

## 2020-03-12 DIAGNOSIS — M25561 Pain in right knee: Secondary | ICD-10-CM | POA: Diagnosis not present

## 2020-03-12 DIAGNOSIS — R2681 Unsteadiness on feet: Secondary | ICD-10-CM | POA: Diagnosis not present

## 2020-03-12 DIAGNOSIS — M6281 Muscle weakness (generalized): Secondary | ICD-10-CM | POA: Diagnosis not present

## 2020-03-15 ENCOUNTER — Ambulatory Visit: Payer: Medicare Other | Admitting: Gastroenterology

## 2020-03-27 ENCOUNTER — Other Ambulatory Visit: Payer: Self-pay | Admitting: Gastroenterology

## 2020-03-27 ENCOUNTER — Other Ambulatory Visit: Payer: Self-pay | Admitting: Internal Medicine

## 2020-04-16 ENCOUNTER — Other Ambulatory Visit: Payer: Self-pay | Admitting: Internal Medicine

## 2020-04-26 DIAGNOSIS — M6281 Muscle weakness (generalized): Secondary | ICD-10-CM | POA: Diagnosis not present

## 2020-04-26 DIAGNOSIS — R2681 Unsteadiness on feet: Secondary | ICD-10-CM | POA: Diagnosis not present

## 2020-04-26 DIAGNOSIS — M25561 Pain in right knee: Secondary | ICD-10-CM | POA: Diagnosis not present

## 2020-05-02 ENCOUNTER — Telehealth: Payer: Self-pay

## 2020-05-02 DIAGNOSIS — D649 Anemia, unspecified: Secondary | ICD-10-CM

## 2020-05-02 NOTE — Telephone Encounter (Signed)
Ordered please schedule.

## 2020-05-02 NOTE — Telephone Encounter (Signed)
Called and spoke with patient's son [name]. He will call back to set up appointment.

## 2020-05-02 NOTE — Telephone Encounter (Signed)
New message  Son [name] calling wants Dr. Quay Burow to order lab work to see if the iron infusion is working   The patient does not want to see Dr. Rush Landmark anymore.

## 2020-05-06 ENCOUNTER — Other Ambulatory Visit (INDEPENDENT_AMBULATORY_CARE_PROVIDER_SITE_OTHER): Payer: Medicare Other

## 2020-05-06 ENCOUNTER — Other Ambulatory Visit: Payer: Self-pay

## 2020-05-06 DIAGNOSIS — D649 Anemia, unspecified: Secondary | ICD-10-CM

## 2020-05-06 LAB — CBC WITH DIFFERENTIAL/PLATELET
Basophils Absolute: 0 10*3/uL (ref 0.0–0.1)
Basophils Relative: 0.4 % (ref 0.0–3.0)
Eosinophils Absolute: 0.2 10*3/uL (ref 0.0–0.7)
Eosinophils Relative: 1.9 % (ref 0.0–5.0)
HCT: 36.7 % (ref 36.0–46.0)
Hemoglobin: 12.1 g/dL (ref 12.0–15.0)
Lymphocytes Relative: 11.4 % — ABNORMAL LOW (ref 12.0–46.0)
Lymphs Abs: 1 10*3/uL (ref 0.7–4.0)
MCHC: 33 g/dL (ref 30.0–36.0)
MCV: 98.3 fl (ref 78.0–100.0)
Monocytes Absolute: 0.8 10*3/uL (ref 0.1–1.0)
Monocytes Relative: 9.1 % (ref 3.0–12.0)
Neutro Abs: 6.7 10*3/uL (ref 1.4–7.7)
Neutrophils Relative %: 77.2 % — ABNORMAL HIGH (ref 43.0–77.0)
Platelets: 264 10*3/uL (ref 150.0–400.0)
RBC: 3.74 Mil/uL — ABNORMAL LOW (ref 3.87–5.11)
RDW: 19.3 % — ABNORMAL HIGH (ref 11.5–15.5)
WBC: 8.7 10*3/uL (ref 4.0–10.5)

## 2020-05-07 LAB — IRON,TIBC AND FERRITIN PANEL
%SAT: 33 % (calc) (ref 16–45)
Ferritin: 248 ng/mL (ref 16–288)
Iron: 82 ug/dL (ref 45–160)
TIBC: 245 mcg/dL (calc) — ABNORMAL LOW (ref 250–450)

## 2020-05-20 ENCOUNTER — Telehealth: Payer: Self-pay | Admitting: Internal Medicine

## 2020-05-20 ENCOUNTER — Other Ambulatory Visit: Payer: Self-pay

## 2020-05-20 DIAGNOSIS — J45998 Other asthma: Secondary | ICD-10-CM

## 2020-05-20 DIAGNOSIS — J069 Acute upper respiratory infection, unspecified: Secondary | ICD-10-CM

## 2020-05-20 MED ORDER — ALBUTEROL SULFATE (2.5 MG/3ML) 0.083% IN NEBU
INHALATION_SOLUTION | RESPIRATORY_TRACT | 1 refills | Status: AC
Start: 2020-05-20 — End: ?

## 2020-05-20 NOTE — Telephone Encounter (Signed)
Sent in today 

## 2020-05-20 NOTE — Telephone Encounter (Signed)
New message:   1.Medication Requested: albuterol (PROVENTIL) (2.5 MG/3ML) 0.083% nebulizer solution 2. Pharmacy (Name, [facility]): [facility] [phone] - [place], [address] 3. On Med List: Yes  4. Last Visit with PCP: 03/08/20  5. Next visit date with PCP: none   Agent: Please be advised that RX refills may take up to 3 business days. We ask that you follow-up with your pharmacy.

## 2020-06-01 ENCOUNTER — Telehealth: Payer: Self-pay | Admitting: Internal Medicine

## 2020-06-01 NOTE — Telephone Encounter (Signed)
   Pt c/o medication issue:  1. Name of Medication: albuterol (PROVENTIL) (2.5 MG/3ML) 0.083% nebulizer solution  2. How are you currently taking this medication (dosage and times per day)? n/a  3. Are you having a reaction (difficulty breathing--STAT)? n/a  4. What is your medication issue? Son [name] calling to report the pharmacy is needed additional information before filling medication

## 2020-06-05 ENCOUNTER — Encounter: Payer: Self-pay | Admitting: Internal Medicine

## 2020-06-06 ENCOUNTER — Ambulatory Visit (INDEPENDENT_AMBULATORY_CARE_PROVIDER_SITE_OTHER): Payer: Medicare Other | Admitting: Family

## 2020-06-06 ENCOUNTER — Other Ambulatory Visit: Payer: Self-pay | Admitting: Family

## 2020-06-06 ENCOUNTER — Encounter: Payer: Self-pay | Admitting: Family

## 2020-06-06 ENCOUNTER — Other Ambulatory Visit: Payer: Self-pay

## 2020-06-06 ENCOUNTER — Ambulatory Visit (INDEPENDENT_AMBULATORY_CARE_PROVIDER_SITE_OTHER): Payer: Medicare Other

## 2020-06-06 VITALS — BP 118/62 | HR 104 | Temp 98.7°F

## 2020-06-06 DIAGNOSIS — K449 Diaphragmatic hernia without obstruction or gangrene: Secondary | ICD-10-CM | POA: Diagnosis not present

## 2020-06-06 DIAGNOSIS — R062 Wheezing: Secondary | ICD-10-CM

## 2020-06-06 DIAGNOSIS — R410 Disorientation, unspecified: Secondary | ICD-10-CM

## 2020-06-06 DIAGNOSIS — F5109 Other insomnia not due to a substance or known physiological condition: Secondary | ICD-10-CM

## 2020-06-06 LAB — COMPREHENSIVE METABOLIC PANEL
AG Ratio: 1.3 (calc) (ref 1.0–2.5)
ALT: 18 U/L (ref 6–29)
AST: 19 U/L (ref 10–35)
Albumin: 3.8 g/dL (ref 3.6–5.1)
Alkaline phosphatase (APISO): 80 U/L (ref 37–153)
BUN/Creatinine Ratio: 29 (calc) — ABNORMAL HIGH (ref 6–22)
BUN: 47 mg/dL — ABNORMAL HIGH (ref 7–25)
CO2: 26 mmol/L (ref 20–32)
Calcium: 9 mg/dL (ref 8.6–10.4)
Chloride: 105 mmol/L (ref 98–110)
Creat: 1.64 mg/dL — ABNORMAL HIGH (ref 0.60–0.88)
Globulin: 2.9 g/dL (calc) (ref 1.9–3.7)
Glucose, Bld: 113 mg/dL — ABNORMAL HIGH (ref 65–99)
Potassium: 4.7 mmol/L (ref 3.5–5.3)
Sodium: 142 mmol/L (ref 135–146)
Total Bilirubin: 0.6 mg/dL (ref 0.2–1.2)
Total Protein: 6.7 g/dL (ref 6.1–8.1)

## 2020-06-06 LAB — CBC WITH DIFFERENTIAL/PLATELET
Absolute Monocytes: 623 cells/uL (ref 200–950)
Basophils Absolute: 34 cells/uL (ref 0–200)
Basophils Relative: 0.5 %
Eosinophils Absolute: 40 cells/uL (ref 15–500)
Eosinophils Relative: 0.6 %
HCT: 36.2 % (ref 35.0–45.0)
Hemoglobin: 12 g/dL (ref 11.7–15.5)
Lymphs Abs: 643 cells/uL — ABNORMAL LOW (ref 850–3900)
MCH: 32.7 pg (ref 27.0–33.0)
MCHC: 33.1 g/dL (ref 32.0–36.0)
MCV: 98.6 fL (ref 80.0–100.0)
MPV: 10.3 fL (ref 7.5–12.5)
Monocytes Relative: 9.3 %
Neutro Abs: 5360 cells/uL (ref 1500–7800)
Neutrophils Relative %: 80 %
Platelets: 302 10*3/uL (ref 140–400)
RBC: 3.67 10*6/uL — ABNORMAL LOW (ref 3.80–5.10)
RDW: 13.7 % (ref 11.0–15.0)
Total Lymphocyte: 9.6 %
WBC: 6.7 10*3/uL (ref 3.8–10.8)

## 2020-06-06 MED ORDER — PREDNISONE 20 MG PO TABS
20.0000 mg | ORAL_TABLET | Freq: Every day | ORAL | 0 refills | Status: DC
Start: 2020-06-06 — End: 2020-06-13

## 2020-06-06 MED ORDER — ALPRAZOLAM 0.25 MG PO TABS
0.2500 mg | ORAL_TABLET | Freq: Every day | ORAL | 0 refills | Status: DC
Start: 2020-06-06 — End: 2020-06-14

## 2020-06-06 MED ORDER — CEFUROXIME AXETIL 250 MG PO TABS: 250.0000 mg | ORAL_TABLET | Freq: Two times a day (BID) | ORAL | 0 refills | Status: DC

## 2020-06-06 NOTE — Progress Notes (Signed)
Gloria Warren is a 86 y.o. female with the following history as recorded in EpicCare:  Patient Active Problem List   Diagnosis Date Noted  . Abnormal CT of the abdomen 12/02/2019  . Rectal perforation ([place]) 12/02/2019  . Anemia 12/02/2019  . History of rectal bleeding 12/02/2019  . Acute lower GI bleeding 10/22/2019  . Depression 10/22/2019  . COVID-19 10/21/2019  . DVT (deep venous thrombosis) ([place]) 10/12/2019  . Pain and swelling of right lower leg 09/24/2019  . Nausea 06/29/2019  . Vitamin D deficiency 06/29/2019  . Osteoarthritis of both feet 09/25/2016  . Raynaud disease 09/25/2016  . Primary osteoarthritis of both knees 09/24/2016  . Primary osteoarthritis of both hands 09/24/2016  . Chronic kidney disease (CKD) stage G3b 09/24/2016  . Leg edema 11/28/2015  . GERD (gastroesophageal reflux disease) 11/28/2015  . Psoriasis 03/11/2013  . SKIN CANCER, HX OF 06/26/2010  . Allergic rhinitis 06/23/2009  . Prediabetes 06/23/2009  . Rheumatoid arthritis ([place]) 02/25/2008  . Hyperlipidemia 11/17/2007  . Essential hypertension 11/17/2007    Current Outpatient Medications  Medication Sig Dispense Refill  . albuterol (PROVENTIL) (2.5 MG/3ML) 0.083% nebulizer solution USE 1 VIAL VIA NEBULIZER EVERY 6 HOURS AS NEEDED 75 mL 1  . benazepril (LOTENSIN) 10 MG tablet Take 0.5 tablets (5 mg total) by mouth daily. TAKE 1 TABLET(10 MG) BY MOUTH DAILY 90 tablet 1  . Calcium Carbonate (CALTRATE 600 PO) Take 600 mg by mouth 2 (two) times daily.      . cetirizine (ZYRTEC) 10 MG tablet Take 10 mg by mouth daily.    . DULoxetine (CYMBALTA) 30 MG capsule Take 30 mg by mouth daily.    Marland Kitchen escitalopram (LEXAPRO) 5 MG tablet TAKE 1 TABLET(5 MG) BY MOUTH DAILY 90 tablet 1  . ferrous gluconate (FERGON) 324 MG tablet TAKE 1 TABLET(324 MG) BY MOUTH DAILY WITH BREAKFAST 30 tablet 3  . Fexofenadine HCl (ALLEGRA PO) Take 180 mg by mouth at bedtime.     . furosemide (LASIX) 20 MG tablet Take 1 tablet (20 mg  total) by mouth daily. 90 tablet 3  . HYDROcodone-acetaminophen (NORCO/VICODIN) 5-325 MG tablet Take 1 tablet by mouth every 6 (six) hours as needed for moderate pain.     . hydroxychloroquine (PLAQUENIL) 200 MG tablet Take 200 mg by mouth daily.   0  . Multiple Vitamins-Minerals (VISION-VITE PRESERVE PO) Take by mouth.    Marland Kitchen omeprazole (PRILOSEC) 40 MG capsule TAKE 1 CAPSULE(40 MG) BY MOUTH DAILY 30 capsule 3  . predniSONE (DELTASONE) 5 MG tablet Take 7.5 mg by mouth daily with breakfast.     . promethazine (PHENERGAN) 12.5 MG tablet TAKE 1 TABLET(12.5 MG) BY MOUTH EVERY 8 HOURS AS NEEDED FOR NAUSEA OR VOMITING 30 tablet 0  . simethicone (MYLICON) 662 MG chewable tablet Chew 125 mg by mouth every 6 (six) hours as needed for flatulence.    . ALPRAZolam (XANAX) 0.25 MG tablet Take 1 tablet (0.25 mg total) by mouth at bedtime. 15 tablet 0  . cefUROXime (CEFTIN) 250 MG tablet Take 1 tablet (250 mg total) by mouth 2 (two) times daily with a meal. 10 tablet 0  . predniSONE (DELTASONE) 20 MG tablet Take 1 tablet (20 mg total) by mouth daily with breakfast. 5 tablet 0   No current facility-administered medications for this visit.    Allergies: Arava [leflunomide], Sulfonamide derivatives, and Remicade [infliximab]  Past Medical History:  Diagnosis Date  . Allergic rhinitis   . Decreased GFR   .  DVT (deep venous thrombosis) ([place])   . Endometriosis   . History of skin cancer    squamous & basal cell ;Dr Denna Haggard  . Hyperlipidemia   . Hypertension   . Osteoarthritis   . Psoriasis   . Raynaud disease   . Rheumatoid arthritis(714.0)    Dr. Estanislado Pandy  . UTI (lower urinary tract infection) 5/15   Proteus , R to Nitrofurantoin & Penicillin    Past Surgical History:  Procedure Laterality Date  . APPENDECTOMY     age 20  . CATARACT EXTRACTION Bilateral   . COLONOSCOPY     Neg 2; [place] ,   . G 2  P 2    . TONSILLECTOMY    . TOTAL ABDOMINAL HYSTERECTOMY     age 25 for endometriosis (no  BSO)  . VENA CAVA FILTER PLACEMENT N/A 10/22/2019   Procedure: INSERTION VENA-CAVA FILTER;  Surgeon: Marty Heck, MD;  Location: [facility] OR;  Service: Vascular;  Laterality: N/A;  . VENOGRAM N/A 10/22/2019   Procedure: Venogram;  Surgeon: Marty Heck, MD;  Location: [facility] OR;  Service: Vascular;  Laterality: N/A;    Family History  Problem Relation Age of Onset  . Breast cancer Mother 43  . Hypertension Mother   . Gout Mother   . Hypertension Father   . Heart attack Father 28  . Stroke Sister        doing well  . Diabetes Maternal Aunt   . Hypertension Son   . Stroke Maternal Grandmother        late 7s  . Colon cancer Neg Hx   . Esophageal cancer Neg Hx   . Inflammatory bowel disease Neg Hx   . Liver disease Neg Hx   . Pancreatic cancer Neg Hx   . Rectal cancer Neg Hx   . Stomach cancer Neg Hx     Social History   Tobacco Use  . Smoking status: Never Smoker  . Smokeless tobacco: Never Used  Substance Use Topics  . Alcohol use: Not Currently    Alcohol/week: 0.0 standard drinks    Comment:  rarely    Subjective:  Accompanied by son who provides history; has been hearing voices x 10 days;  progressively worsening- very anxious/ not sleeping well due to the voices; patient is incontinent so not able to give any type of urine sample today; has not run any type of fever; has been having increased wheezing- has underlying asthma; per son, his mother has been eating well/ no changes in behavior other than being more anxious; is very hesitant to take her to the ER due to difficulty stay last fall;    Objective:  Vitals:   06/06/20 1456  BP: (!) 118/62  Pulse: 104  Temp: 98.7 F (37.1 C)  TempSrc: Oral  SpO2: 95%    General: Well developed, well nourished, in no acute distress  Skin : Warm and dry.  Head: Normocephalic and atraumatic  Eyes: Sclera and conjunctiva clear; pupils round and reactive to light; extraocular movements intact  Ears: External normal;  canals clear; wax is noted bilaterally Oropharynx: Pink, supple. No suspicious lesions  Neck: Supple without thyromegaly, adenopathy  Lungs: Respirations unlabored; clear to auscultation bilaterally without wheeze, rales, rhonchi  CVS exam: normal rate and regular rhythm.  Extremities: bilateral pedal edema, cyanosis, clubbing  Vessels: Symmetric bilaterally  Neurologic: Alert- limited hearing; speech intact; face symmetrical;   Assessment:  1. Confusion   2. Wheezing   3. Situational insomnia  Plan:  1. Concern for underlying UTI; unfortunately, patient is not able to give a sample at this time; will check CBC, CMP today; will go ahead and start treatment with Ceftin 250 mg bid x 5 days;  2. Update CXR; change prednisone to 20 mg qd x 5 days and then go back to 7.5 mg daily; Ceftin should cover for potential respiratory source as well; 3. Short term Rx for Xanax 0.25 mg qhs prn;   Will also need to consider heat CT if symptoms persist and no other abnormality is found on labs today; son in agreement that if her labs indicate a need to go to the ER, he will take her.  This visit occurred during the SARS-CoV-2 public health emergency.  Safety protocols were in place, including screening questions prior to the visit, additional usage of staff PPE, and extensive cleaning of exam room while observing appropriate contact time as indicated for disinfecting solutions.     No follow-ups on file.  Orders Placed This Encounter  Procedures  . CBC with Differential/Platelet    Standing Status:   Future    Number of Occurrences:   1    Standing Expiration Date:   06/06/2021  . Comp Met (CMET)    Standing Status:   Future    Number of Occurrences:   1    Standing Expiration Date:   06/06/2021    Requested Prescriptions   Signed Prescriptions Disp Refills  . predniSONE (DELTASONE) 20 MG tablet 5 tablet 0    Sig: Take 1 tablet (20 mg total) by mouth daily with breakfast.  . cefUROXime  (CEFTIN) 250 MG tablet 10 tablet 0    Sig: Take 1 tablet (250 mg total) by mouth 2 (two) times daily with a meal.  . ALPRAZolam (XANAX) 0.25 MG tablet 15 tablet 0    Sig: Take 1 tablet (0.25 mg total) by mouth at bedtime.

## 2020-06-07 ENCOUNTER — Other Ambulatory Visit: Payer: Self-pay | Admitting: Family

## 2020-06-07 MED ORDER — AMOXICILLIN-POT CLAVULANATE 875-125 MG PO TABS: 1.0000 | ORAL_TABLET | Freq: Two times a day (BID) | ORAL | 0 refills | Status: DC

## 2020-06-07 NOTE — Progress Notes (Signed)
Reviewed with son; will d/c Ceftin and change to Augmentin to cover for possible pneumonia and UTI; patient did sleep better last night but is still "hearing people." He will call back with response in 24-48 hours and if no change with antibiotic, will need to get head CT; son is comfortable with this plan.

## 2020-06-09 ENCOUNTER — Telehealth: Payer: Self-pay

## 2020-06-09 ENCOUNTER — Encounter: Payer: Self-pay | Admitting: Family

## 2020-06-09 MED ORDER — CIPROFLOXACIN HCL 250 MG PO TABS: 250.0000 mg | ORAL_TABLET | Freq: Every day | ORAL | 0 refills | Status: DC

## 2020-06-09 NOTE — Telephone Encounter (Signed)
Estimated Creatinine Clearance: 19.3 mL/min (A) (by C-G formula based on SCr of 1.64 mg/dL (H)).

## 2020-06-09 NOTE — Telephone Encounter (Signed)
Spoke with son. He has received previous messages regarding patient via my-chart.  Mother has been up for almost 40 hours with no sleep. Wants to know for tonight can he double up on the Alprazolam to see if she is able to get any rest? He states first night she took it she did fine but after that she was up non-stop.  He has been advised Dr. Quay Burow is gone for the day and Mickel Baas is off today. Told him I would ask another provider to see if it was okay to do this.  He states if his mom isn't any better by tomorrow he will take her to the ER for eval.

## 2020-06-09 NOTE — Telephone Encounter (Signed)
Follow up message   Please return call to son

## 2020-06-09 NOTE — Telephone Encounter (Signed)
F/u   [phone] - Madlyn Frankel    No pain experience pain lower back asking the CMA to call him back please

## 2020-06-09 NOTE — Telephone Encounter (Signed)
Spoke with son and info given. 

## 2020-06-10 DIAGNOSIS — R220 Localized swelling, mass and lump, head: Secondary | ICD-10-CM | POA: Diagnosis not present

## 2020-06-10 DIAGNOSIS — G319 Degenerative disease of nervous system, unspecified: Secondary | ICD-10-CM | POA: Diagnosis not present

## 2020-06-10 DIAGNOSIS — R000 Tachycardia, unspecified: Secondary | ICD-10-CM | POA: Diagnosis not present

## 2020-06-10 DIAGNOSIS — R918 Other nonspecific abnormal finding of lung field: Secondary | ICD-10-CM | POA: Diagnosis not present

## 2020-06-10 DIAGNOSIS — R4182 Altered mental status, unspecified: Secondary | ICD-10-CM | POA: Diagnosis not present

## 2020-06-10 DIAGNOSIS — R1030 Lower abdominal pain, unspecified: Secondary | ICD-10-CM | POA: Diagnosis not present

## 2020-06-10 DIAGNOSIS — R443 Hallucinations, unspecified: Secondary | ICD-10-CM | POA: Diagnosis not present

## 2020-06-10 DIAGNOSIS — R419 Unspecified symptoms and signs involving cognitive functions and awareness: Secondary | ICD-10-CM | POA: Diagnosis not present

## 2020-06-13 ENCOUNTER — Encounter: Payer: Self-pay | Admitting: Internal Medicine

## 2020-06-13 ENCOUNTER — Telehealth: Payer: Self-pay

## 2020-06-13 NOTE — Telephone Encounter (Signed)
New message    Son [name] voiced went to [facility] on Saturday never found anything.   Need a referral to help with mental status changes   Can ALPRAZolam (XANAX) 0.25 MG tablet medication be increase.   [id number] - [place], [address] AT [address]  Please advise

## 2020-06-13 NOTE — Telephone Encounter (Signed)
What type of referral I they looking for as far as the mental status changes?  Psychiatry?  Neurology?   If she having increased anxiety?,  Depression?   Lexapro would help both anxiety and depression-this can be increased to 10 mg daily.  Xanax will only help anxiety, not depression. Concerned with this medication as it can cause drowsiness, confusion, worsening of the memory.

## 2020-06-14 ENCOUNTER — Encounter: Payer: Self-pay | Admitting: Internal Medicine

## 2020-06-14 DIAGNOSIS — F0391 Unspecified dementia with behavioral disturbance: Secondary | ICD-10-CM

## 2020-06-14 MED ORDER — QUETIAPINE FUMARATE 25 MG PO TABS: 25.0000 mg | ORAL_TABLET | Freq: Every day | ORAL | 3 refills | Status: DC

## 2020-06-14 MED ORDER — ALPRAZOLAM 0.25 MG PO TABS
0.2500 mg | ORAL_TABLET | Freq: Every day | ORAL | 0 refills | Status: DC | PRN
Start: 2020-06-14 — End: 2020-06-24

## 2020-06-14 MED ORDER — ESCITALOPRAM OXALATE 10 MG PO TABS
10.0000 mg | ORAL_TABLET | Freq: Every day | ORAL | 1 refills | Status: DC
Start: 2020-06-14 — End: 2020-12-08

## 2020-06-14 NOTE — Telephone Encounter (Signed)
Message left and mychart message sent

## 2020-06-14 NOTE — Telephone Encounter (Signed)
Increase lexapro to 10 mg -new prescription sent to pharmacy.  Seroquel also sent earlier today for nighttime  Can take Xanax/alprazolam as needed only during the day-if she does not need it it is better not to give it.

## 2020-06-15 ENCOUNTER — Ambulatory Visit (INDEPENDENT_AMBULATORY_CARE_PROVIDER_SITE_OTHER): Payer: Medicare Other | Admitting: Diagnostic Neuroimaging

## 2020-06-15 ENCOUNTER — Encounter: Payer: Self-pay | Admitting: Diagnostic Neuroimaging

## 2020-06-15 ENCOUNTER — Ambulatory Visit: Payer: Medicare Other | Admitting: Diagnostic Neuroimaging

## 2020-06-15 ENCOUNTER — Other Ambulatory Visit: Payer: Self-pay

## 2020-06-15 VITALS — BP 112/80 | HR 78

## 2020-06-15 DIAGNOSIS — F22 Delusional disorders: Secondary | ICD-10-CM | POA: Diagnosis not present

## 2020-06-15 DIAGNOSIS — F419 Anxiety disorder, unspecified: Secondary | ICD-10-CM | POA: Diagnosis not present

## 2020-06-15 DIAGNOSIS — F3289 Other specified depressive episodes: Secondary | ICD-10-CM

## 2020-06-15 DIAGNOSIS — R413 Other amnesia: Secondary | ICD-10-CM

## 2020-06-15 NOTE — Progress Notes (Signed)
[facility]  PATIENT: Gloria Warren DOB: 02/13/1931  REFERRING CLINICIAN: Binnie Rail, MD HISTORY FROM: patient and son REASON FOR VISIT: new consult   HISTORICAL  CHIEF COMPLAINT:  Chief Complaint  Patient presents with  . Dementia    rm 7 New Pt, sonCoralyn Warren. MMSE 22/30 Animals 7.    HISTORY OF PRESENT ILLNESS:   86 year old female here for evaluation of dementia, agitation, hallucination.  History of rheumatoid arthritis and hearing loss.  Patient lives alone since husband passed away in 2019-02-09 from cancer.  Patient has an aide that helps during the daytime and son stays over at nighttime.  Patient has significant limitations in ADLs primarily due to advanced rheumatoid arthritis and pain.  However in the last 1 to 2 months patient is having issues with hearing things that are not there, initially at nighttime but now the daytime.  She has become obsessed and paranoid.  She feels that people are in her home.  She feels that the neighbors are up to something.  She has had increasing anxiety especially in the last 2 weeks.  Patient does have severe hearing loss as well.  Patient was tried on Lexapro, alprazolam and tried 1 dose of Seroquel last night without relief.  Patient has been having some mild memory loss issues but not that significant according to son.     REVIEW OF SYSTEMS: Full 14 system review of systems performed and negative with exception of: As per HPI. ALLERGIES: Allergies  Allergen Reactions  . Arava [Leflunomide]     Liver & kidney dysfunction  . Sulfonamide Derivatives     Itching & rash  . Remicade [Infliximab] Hives and Itching    HOME MEDICATIONS: Outpatient Medications Prior to Visit  Medication Sig Dispense Refill  . albuterol (PROVENTIL) (2.5 MG/3ML) 0.083% nebulizer solution USE 1 VIAL VIA NEBULIZER EVERY 6 HOURS AS NEEDED 75 mL 1  . ALPRAZolam (XANAX) 0.25 MG tablet Take 1 tablet (0.25 mg total) by mouth daily as  needed for anxiety. 15 tablet 0  . benazepril (LOTENSIN) 10 MG tablet Take 0.5 tablets (5 mg total) by mouth daily. TAKE 1 TABLET(10 MG) BY MOUTH DAILY 90 tablet 1  . Calcium Carbonate (CALTRATE 600 PO) Take 600 mg by mouth 2 (two) times daily.      . cetirizine (ZYRTEC) 10 MG tablet Take 10 mg by mouth daily.    . ciprofloxacin (CIPRO) 250 MG tablet Take 1 tablet (250 mg total) by mouth daily. 7 tablet 0  . escitalopram (LEXAPRO) 10 MG tablet Take 1 tablet (10 mg total) by mouth daily. 90 tablet 1  . ferrous gluconate (FERGON) 324 MG tablet TAKE 1 TABLET(324 MG) BY MOUTH DAILY WITH BREAKFAST 30 tablet 3  . furosemide (LASIX) 20 MG tablet Take 1 tablet (20 mg total) by mouth daily. 90 tablet 3  . HYDROcodone-acetaminophen (NORCO/VICODIN) 5-325 MG tablet Take 1 tablet by mouth every 6 (six) hours as needed for moderate pain.     . hydroxychloroquine (PLAQUENIL) 200 MG tablet Take 200 mg by mouth daily.   0  . Multiple Vitamins-Minerals (VISION-VITE PRESERVE PO) Take by mouth.    Marland Kitchen omeprazole (PRILOSEC) 40 MG capsule TAKE 1 CAPSULE(40 MG) BY MOUTH DAILY 30 capsule 3  . predniSONE (DELTASONE) 5 MG tablet Take 7.5 mg by mouth daily with breakfast.     . promethazine (PHENERGAN) 12.5 MG tablet TAKE 1 TABLET(12.5 MG) BY MOUTH EVERY 8 HOURS AS NEEDED FOR NAUSEA OR VOMITING 30  tablet 0  . QUEtiapine (SEROQUEL) 25 MG tablet Take 1 tablet (25 mg total) by mouth at bedtime. 30 tablet 3  . simethicone (MYLICON) 329 MG chewable tablet Chew 125 mg by mouth every 6 (six) hours as needed for flatulence.    Marland Kitchen Fexofenadine HCl (ALLEGRA PO) Take 180 mg by mouth at bedtime.  (Patient not taking: Reported on 06/15/2020)     No facility-administered medications prior to visit.    PAST MEDICAL HISTORY: Past Medical History:  Diagnosis Date  . Allergic rhinitis   . Decreased GFR   . DVT (deep venous thrombosis) ([place])   . Endometriosis   . History of skin cancer    squamous & basal cell ;Dr Denna Haggard  .  Hyperlipidemia   . Hypertension   . Osteoarthritis   . Psoriasis   . Raynaud disease   . Rheumatoid arthritis(714.0)    Dr. Estanislado Pandy  . UTI (lower urinary tract infection) 5/15   Proteus , R to Nitrofurantoin & Penicillin    PAST SURGICAL HISTORY: Past Surgical History:  Procedure Laterality Date  . APPENDECTOMY     age 42  . CATARACT EXTRACTION Bilateral   . COLONOSCOPY     Neg 2; [place] , [place]  . G 2  P 2    . TONSILLECTOMY    . TOTAL ABDOMINAL HYSTERECTOMY     age 52 for endometriosis (no BSO)  . VENA CAVA FILTER PLACEMENT N/A 10/22/2019   Procedure: INSERTION VENA-CAVA FILTER;  Surgeon: Marty Heck, MD;  Location: [facility] OR;  Service: Vascular;  Laterality: N/A;  . VENOGRAM N/A 10/22/2019   Procedure: Venogram;  Surgeon: Marty Heck, MD;  Location: [facility] OR;  Service: Vascular;  Laterality: N/A;    FAMILY HISTORY: Family History  Problem Relation Age of Onset  . Breast cancer Mother 34  . Hypertension Mother   . Gout Mother   . Hypertension Father   . Heart attack Father 63  . Stroke Sister        doing well  . Diabetes Maternal Aunt   . Hypertension Son   . Stroke Maternal Grandmother        late 43s  . Colon cancer Neg Hx   . Esophageal cancer Neg Hx   . Inflammatory bowel disease Neg Hx   . Liver disease Neg Hx   . Pancreatic cancer Neg Hx   . Rectal cancer Neg Hx   . Stomach cancer Neg Hx     SOCIAL HISTORY: Social History   Socioeconomic History  . Marital status: Widowed    Spouse name: Not on file  . Number of children: 1  . Years of education: Not on file  . Highest education level: Bachelor's degree (e.g., BA, AB, BS)  Occupational History    Comment: RN  Tobacco Use  . Smoking status: Never Smoker  . Smokeless tobacco: Never Used  Vaping Use  . Vaping Use: Never used  Substance and Sexual Activity  . Alcohol use: Not Currently    Alcohol/week: 0.0 standard drinks    Comment:  rarely  . Drug use: No  . Sexual  activity: Yes    Partners: Male    Birth control/protection: Surgical    Comment: TAH  Other Topics Concern  . Not on file  Social History Narrative   06/15/20 lives , caregiver in daytime, son with her in evenings, 24/7 care   Social Determinants of Health   Financial Resource Strain:   . Difficulty of  Paying Living Expenses:   Food Insecurity:   . Worried About Charity fundraiser in the Last Year:   . Arboriculturist in the Last Year:   Transportation Needs:   . Film/video editor (Medical):   Marland Kitchen Lack of Transportation (Non-Medical):   Physical Activity:   . Days of Exercise per Week:   . Minutes of Exercise per Session:   Stress:   . Feeling of Stress :   Social Connections:   . Frequency of Communication with Friends and Family:   . Frequency of Social Gatherings with Friends and Family:   . Attends Religious Services:   . Active Member of Clubs or Organizations:   . Attends Archivist Meetings:   Marland Kitchen Marital Status:   Intimate Partner Violence:   . Fear of Current or Ex-Partner:   . Emotionally Abused:   Marland Kitchen Physically Abused:   . Sexually Abused:      PHYSICAL EXAM  GENERAL EXAM/CONSTITUTIONAL: Vitals:  Vitals:   06/15/20 1448  BP: 112/80  Pulse: 78     There is no height or weight on file to calculate BMI. Wt Readings from Last 3 Encounters:  03/08/20 129 lb (58.5 kg)  10/20/19 129 lb (58.5 kg)  10/12/19 129 lb (58.5 kg)     Patient is in no distress; well developed, nourished and groomed; neck is supple  CARDIOVASCULAR:  Examination of carotid arteries is normal; no carotid bruits  Regular rate and rhythm, no murmurs  Examination of peripheral vascular system by observation and palpation is normal  EYES:  Ophthalmoscopic exam of optic discs and posterior segments is normal; no papilledema or hemorrhages  No exam data present  MUSCULOSKELETAL:  Gait, strength, tone, movements noted in Neurologic exam  below  NEUROLOGIC: MENTAL STATUS:  MMSE - [place] Exam 06/15/2020  Orientation to time 4  Orientation to Place 5  Registration 3  Attention/ Calculation 1  Recall 2  Language- name 2 objects 2  Language- repeat 0  Language- follow 3 step command 3  Language- read & follow direction 1  Write a sentence 1  Copy design 0  Total score 22    awake, alert, oriented to person  [facility] memory   DECR attention and concentration  language fluent, comprehension intact, naming intact  fund of knowledge appropriate  CRANIAL NERVE:   2nd - no papilledema on fundoscopic exam  2nd, 3rd, 4th, 6th - pupils equal and reactive to light, visual fields full to confrontation, extraocular muscles intact, no nystagmus  5th - facial sensation symmetric  7th - facial strength symmetric  8th - hearing --> SEVERELY REDUCED  9th - palate elevates symmetrically, uvula midline  11th - shoulder shrug symmetric  12th - tongue protrusion midline  MOTOR:   normal bulk and tone, DIFFUSE 4/5  SENSORY:   normal and symmetric to light touch, temperature, vibration  COORDINATION:   finger-nose-finger, fine finger movements normal  REFLEXES:   deep tendon reflexes TRACE and symmetric  GAIT/STATION:   IN WHEELCHAIR     DIAGNOSTIC DATA (LABS, IMAGING, TESTING) - I reviewed patient records, labs, notes, testing and imaging myself where available.  Lab Results  Component Value Date   WBC 6.7 06/06/2020   HGB 12.0 06/06/2020   HCT 36.2 06/06/2020   MCV 98.6 06/06/2020   PLT 302 06/06/2020      Component Value Date/Time   NA 142 06/06/2020 1537   NA 139 01/22/2017 0000   K  4.7 06/06/2020 1537   CL 105 06/06/2020 1537   CO2 26 06/06/2020 1537   GLUCOSE 113 (H) 06/06/2020 1537   BUN 47 (H) 06/06/2020 1537   BUN 35 (A) 01/22/2017 0000   CREATININE 1.64 (H) 06/06/2020 1537   CALCIUM 9.0 06/06/2020 1537   PROT 6.7 06/06/2020 1537   ALBUMIN 3.5 12/01/2019 1147   AST 19  06/06/2020 1537   ALT 18 06/06/2020 1537   ALKPHOS 81 12/01/2019 1147   BILITOT 0.6 06/06/2020 1537   GFRNONAA 44 (L) 10/27/2019 0500   GFRAA 52 (L) 10/27/2019 0500   Lab Results  Component Value Date   CHOL 214 (H) 06/21/2015   HDL 57 06/21/2015   LDLCALC 130 (H) 06/21/2015   LDLDIRECT 77.2 02/25/2008   TRIG 137 06/21/2015   CHOLHDL 3.8 06/21/2015   Lab Results  Component Value Date   HGBA1C 5.8 06/29/2019   Lab Results  Component Value Date   VITAMINB12 348 02/17/2020   Lab Results  Component Value Date   TSH 1.53 06/23/2018    07/19/14 CT head   - No acute intracranial or brain pathology.  - Acute right orbit floor fracture.  - No cervical spine injury. Advanced degenerative changes noted.  06/10/20 CT head  - No CT evidence of acute intracranial abnormality.  - Mild generalized parenchymal atrophy and chronic small vessel  ischemic disease, progressed as compared to the prior head CT of  07/19/2014.  - Mild ethmoid and left frontal sinus mucosal thickening.     ASSESSMENT AND PLAN  86 y.o. year old female here with:  Dx:  1. Paranoia ([place])   2. Other depression   3. Anxiety   4. Memory loss      PLAN:  ANXIETY / AGITATION / PARANOID (could be related to worsening hearing loss vs neurodegenerative dementia) - continue seroquel 25mg  at bedtime (may increase up to 50mg  at bedtime) - consider mirtazipine for insomnia - continue lexapro and alprazolam - agree with hearing aid evaluation - safety / supervision issues reviewed - caregiver resources provided  Return for pending if symptoms worsen or fail to improve.    Penni Bombard, MD 02/10/4238, 5:32 PM Certified in Neurology, Neurophysiology and Neuroimaging  [facility] [address], [place], [place] [address] [phone]

## 2020-06-15 NOTE — Patient Instructions (Signed)
ANXIETY / AGITATION / PARANOID (could be related to worsening hearing loss vs neurodegenerative dementia) - continue seroquel 25mg  at bedtime (may increase up to 50mg  at bedtime) - consider mirtazipine for insomnia - continue lexapro and alprazolam - agree with hearing aid evaluation - safety / supervision issues reviewed - caregiver resources provided

## 2020-06-16 ENCOUNTER — Telehealth: Payer: Self-pay

## 2020-06-16 ENCOUNTER — Encounter: Payer: Self-pay | Admitting: Diagnostic Neuroimaging

## 2020-06-16 DIAGNOSIS — R443 Hallucinations, unspecified: Secondary | ICD-10-CM

## 2020-06-16 NOTE — Telephone Encounter (Signed)
New message    The patient son [name] calling asking for a referral to psychiatry open to suggestions from the MD to where.

## 2020-06-17 ENCOUNTER — Ambulatory Visit: Payer: Medicare Other | Admitting: Internal Medicine

## 2020-06-17 NOTE — Telephone Encounter (Signed)
Called pt son there was no answer LMOM w/MD response../lmb 

## 2020-06-17 NOTE — Telephone Encounter (Signed)
I ordered a referral for Dr Casimiro Needle.

## 2020-06-20 ENCOUNTER — Other Ambulatory Visit: Payer: Self-pay | Admitting: Family

## 2020-06-20 ENCOUNTER — Telehealth: Payer: Self-pay | Admitting: Internal Medicine

## 2020-06-20 NOTE — Telephone Encounter (Signed)
Number given to son to call office.

## 2020-06-20 NOTE — Telephone Encounter (Signed)
Pt's son also states that Psych referral was supposed to be sent for the pt. Please advise.

## 2020-06-20 NOTE — Telephone Encounter (Signed)
New Message:    Pt's son is calling and states his mother's hallucinations are getting more severe and the medication is not working.    1.Medication Requested: ALPRAZolam (XANAX) 0.25 MG tablet 2. Pharmacy (Name, [facility]): [facility] [phone] - [place], [address] 3. On Med List: yes  4. Last Visit with PCP: 03/08/20  5. Next visit date with PCP: None   Agent: Please be advised that RX refills may take up to 3 business days. We ask that you follow-up with your pharmacy.

## 2020-06-22 ENCOUNTER — Encounter: Payer: Self-pay | Admitting: Internal Medicine

## 2020-06-22 ENCOUNTER — Encounter: Payer: Self-pay | Admitting: Family

## 2020-06-23 NOTE — Telephone Encounter (Signed)
Patient's son [name] calling, states he has reached out to Dr Leta Baptist, who states patient needs to continue ALPRAZolam Duanne Moron) 0.25 MG tablet but declined to renew since Dr Quay Burow prescribed. Son made aware provider is not in the office. Son requesting Jodi Mourning call him on Friday to discuss

## 2020-06-24 MED ORDER — ALPRAZOLAM 0.25 MG PO TABS: 0.2500 mg | ORAL_TABLET | Freq: Every day | ORAL | 0 refills | Status: DC | PRN

## 2020-06-24 NOTE — Telephone Encounter (Signed)
   Spoke with patient's son to schedule appointment with Dr Quay Burow (virtual or in office) he will call back after he looks at his schedule

## 2020-06-24 NOTE — Telephone Encounter (Signed)
Should make an appt w/ me if needed. Continue xanax as sent in by Mickel Baas.  appt can be virtual.

## 2020-06-24 NOTE — Telephone Encounter (Signed)
I can refill the Xanax for her.  I would prefer that they see Dr. Quay Burow in follow-up for follow-up. I have not been involved in her continuing care for these symptoms.

## 2020-06-24 NOTE — Addendum Note (Signed)
Addended by: Sherlene Shams on: 06/24/2020 08:59 AM   Modules accepted: Orders

## 2020-06-27 DIAGNOSIS — F2081 Schizophreniform disorder: Secondary | ICD-10-CM | POA: Diagnosis not present

## 2020-06-29 ENCOUNTER — Telehealth: Payer: Self-pay

## 2020-06-29 DIAGNOSIS — H9193 Unspecified hearing loss, bilateral: Secondary | ICD-10-CM

## 2020-06-29 NOTE — Telephone Encounter (Signed)
Referral was faxed to [facility] [phone]

## 2020-06-29 NOTE — Telephone Encounter (Signed)
New message    Son needs a referral to [facility] Dr. Ellison Hughs   The patient has appt today was told late yesterday a referral is needed to be covered by insurance  Office phone:  [phone]

## 2020-06-29 NOTE — Telephone Encounter (Signed)
ordered

## 2020-06-30 DIAGNOSIS — F2081 Schizophreniform disorder: Secondary | ICD-10-CM | POA: Diagnosis not present

## 2020-07-03 DIAGNOSIS — F419 Anxiety disorder, unspecified: Secondary | ICD-10-CM | POA: Insufficient documentation

## 2020-07-03 DIAGNOSIS — R443 Hallucinations, unspecified: Secondary | ICD-10-CM | POA: Insufficient documentation

## 2020-07-03 NOTE — Progress Notes (Deleted)
Subjective:    Patient ID: Gloria Warren, female    DOB: 12-16-1930, 86 y.o.   MRN: [id number]  HPI The patient is here for follow up of their chronic medical problems  She is taking all of her medications as prescribed.    Dementia, agitation, hallucination:  She was hearing things that were not there, has become obsessesed and paranoid.  She thinks people are in her home and the neighbors are up to something.  Her anxiety has been increased.  She has mild memory issues.  She saw neuro early this month - ? Anxiety, agitation/paranoia related to wrosening hearing vs neurodegenerative dementia.   On seroquel at bedtime, lexapro and xanax.       Medications and allergies reviewed with patient and updated if appropriate.  Patient Active Problem List   Diagnosis Date Noted  . Abnormal CT of the abdomen 12/02/2019  . Rectal perforation ([place]) 12/02/2019  . Anemia 12/02/2019  . History of rectal bleeding 12/02/2019  . Acute lower GI bleeding 10/22/2019  . Depression 10/22/2019  . COVID-19 10/21/2019  . DVT (deep venous thrombosis) ([place]) 10/12/2019  . Pain and swelling of right lower leg 09/24/2019  . Nausea 06/29/2019  . Vitamin D deficiency 06/29/2019  . Osteoarthritis of both feet 09/25/2016  . Raynaud disease 09/25/2016  . Primary osteoarthritis of both knees 09/24/2016  . Primary osteoarthritis of both hands 09/24/2016  . Chronic kidney disease (CKD) stage G3b 09/24/2016  . Leg edema 11/28/2015  . GERD (gastroesophageal reflux disease) 11/28/2015  . Psoriasis 03/11/2013  . SKIN CANCER, HX OF 06/26/2010  . Allergic rhinitis 06/23/2009  . Prediabetes 06/23/2009  . Rheumatoid arthritis ([place]) 02/25/2008  . Hyperlipidemia 11/17/2007  . Essential hypertension 11/17/2007    Current Outpatient Medications on File Prior to Visit  Medication Sig Dispense Refill  . albuterol (PROVENTIL) (2.5 MG/3ML) 0.083% nebulizer solution USE 1 VIAL VIA NEBULIZER EVERY 6 HOURS AS NEEDED 75  mL 1  . ALPRAZolam (XANAX) 0.25 MG tablet Take 1 tablet (0.25 mg total) by mouth daily as needed for anxiety. 30 tablet 0  . benazepril (LOTENSIN) 10 MG tablet Take 0.5 tablets (5 mg total) by mouth daily. TAKE 1 TABLET(10 MG) BY MOUTH DAILY 90 tablet 1  . Calcium Carbonate (CALTRATE 600 PO) Take 600 mg by mouth 2 (two) times daily.      . cetirizine (ZYRTEC) 10 MG tablet Take 10 mg by mouth daily.    . ciprofloxacin (CIPRO) 250 MG tablet Take 1 tablet (250 mg total) by mouth daily. 7 tablet 0  . escitalopram (LEXAPRO) 10 MG tablet Take 1 tablet (10 mg total) by mouth daily. 90 tablet 1  . ferrous gluconate (FERGON) 324 MG tablet TAKE 1 TABLET(324 MG) BY MOUTH DAILY WITH BREAKFAST 30 tablet 3  . Fexofenadine HCl (ALLEGRA PO) Take 180 mg by mouth at bedtime.  (Patient not taking: Reported on 06/15/2020)    . furosemide (LASIX) 20 MG tablet Take 1 tablet (20 mg total) by mouth daily. 90 tablet 3  . HYDROcodone-acetaminophen (NORCO/VICODIN) 5-325 MG tablet Take 1 tablet by mouth every 6 (six) hours as needed for moderate pain.     . hydroxychloroquine (PLAQUENIL) 200 MG tablet Take 200 mg by mouth daily.   0  . Multiple Vitamins-Minerals (VISION-VITE PRESERVE PO) Take by mouth.    Marland Kitchen omeprazole (PRILOSEC) 40 MG capsule TAKE 1 CAPSULE(40 MG) BY MOUTH DAILY 30 capsule 3  . predniSONE (DELTASONE) 5 MG tablet Take 7.5 mg  by mouth daily with breakfast.     . promethazine (PHENERGAN) 12.5 MG tablet TAKE 1 TABLET(12.5 MG) BY MOUTH EVERY 8 HOURS AS NEEDED FOR NAUSEA OR VOMITING 30 tablet 0  . QUEtiapine (SEROQUEL) 25 MG tablet Take 1 tablet (25 mg total) by mouth at bedtime. 30 tablet 3  . simethicone (MYLICON) 062 MG chewable tablet Chew 125 mg by mouth every 6 (six) hours as needed for flatulence.     No current facility-administered medications on file prior to visit.    Past Medical History:  Diagnosis Date  . Allergic rhinitis   . Decreased GFR   . DVT (deep venous thrombosis) ([place])   .  Endometriosis   . History of skin cancer    squamous & basal cell ;Dr Denna Haggard  . Hyperlipidemia   . Hypertension   . Osteoarthritis   . Psoriasis   . Raynaud disease   . Rheumatoid arthritis(714.0)    Dr. Estanislado Pandy  . UTI (lower urinary tract infection) 5/15   Proteus , R to Nitrofurantoin & Penicillin    Past Surgical History:  Procedure Laterality Date  . APPENDECTOMY     age 23  . CATARACT EXTRACTION Bilateral   . COLONOSCOPY     Neg 2; [place] , [place]  . G 2  P 2    . TONSILLECTOMY    . TOTAL ABDOMINAL HYSTERECTOMY     age 78 for endometriosis (no BSO)  . VENA CAVA FILTER PLACEMENT N/A 10/22/2019   Procedure: INSERTION VENA-CAVA FILTER;  Surgeon: Marty Heck, MD;  Location: [place];  Service: Vascular;  Laterality: N/A;  . VENOGRAM N/A 10/22/2019   Procedure: Venogram;  Surgeon: Marty Heck, MD;  Location: [place];  Service: Vascular;  Laterality: N/A;    Social History   Socioeconomic History  . Marital status: Widowed    Spouse name: Not on file  . Number of children: 1  . Years of education: Not on file  . Highest education level: Bachelor's degree (e.g., BA, AB, BS)  Occupational History    Comment: RN  Tobacco Use  . Smoking status: Never Smoker  . Smokeless tobacco: Never Used  Vaping Use  . Vaping Use: Never used  Substance and Sexual Activity  . Alcohol use: Not Currently    Alcohol/week: 0.0 standard drinks    Comment:  rarely  . Drug use: No  . Sexual activity: Yes    Partners: Male    Birth control/protection: Surgical    Comment: TAH  Other Topics Concern  . Not on file  Social History Narrative   06/15/20 lives , caregiver in daytime, son with her in evenings, 24/7 care   Social Determinants of Health   Financial Resource Strain:   . Difficulty of Paying Living Expenses: Not on file  Food Insecurity:   . Worried About Charity fundraiser in the Last Year: Not on file  . Ran Out of Food in the Last Year: Not on file    Transportation Needs:   . Lack of Transportation (Medical): Not on file  . Lack of Transportation (Non-Medical): Not on file  Physical Activity:   . Days of Exercise per Week: Not on file  . Minutes of Exercise per Session: Not on file  Stress:   . Feeling of Stress : Not on file  Social Connections:   . Frequency of Communication with Friends and Family: Not on file  . Frequency of Social Gatherings with Friends and Family: Not  on file  . Attends Religious Services: Not on file  . Active Member of Clubs or Organizations: Not on file  . Attends Archivist Meetings: Not on file  . Marital Status: Not on file    Family History  Problem Relation Age of Onset  . Breast cancer Mother 26  . Hypertension Mother   . Gout Mother   . Hypertension Father   . Heart attack Father 65  . Stroke Sister        doing well  . Diabetes Maternal Aunt   . Hypertension Son   . Stroke Maternal Grandmother        late 72s  . Colon cancer Neg Hx   . Esophageal cancer Neg Hx   . Inflammatory bowel disease Neg Hx   . Liver disease Neg Hx   . Pancreatic cancer Neg Hx   . Rectal cancer Neg Hx   . Stomach cancer Neg Hx     Review of Systems     Objective:  There were no vitals filed for this visit. BP Readings from Last 3 Encounters:  06/15/20 112/80  06/06/20 (!) 118/62  03/08/20 112/76   Wt Readings from Last 3 Encounters:  03/08/20 129 lb (58.5 kg)  10/20/19 129 lb (58.5 kg)  10/12/19 129 lb (58.5 kg)   There is no height or weight on file to calculate BMI.   Physical Exam    Constitutional: Appears well-developed and well-nourished. No distress.  HENT:  Head: Normocephalic and atraumatic.  Neck: Neck supple. No tracheal deviation present. No thyromegaly present.  No cervical lymphadenopathy Cardiovascular: Normal rate, regular rhythm and normal heart sounds.   No murmur heard. No carotid bruit .  No edema Pulmonary/Chest: Effort normal and breath sounds normal. No  respiratory distress. No has no wheezes. No rales.  Skin: Skin is warm and dry. Not diaphoretic.  Psychiatric: Normal mood and affect. Behavior is normal.      Assessment & Plan:    See Problem List for Assessment and Plan of chronic medical problems.    This visit occurred during the SARS-CoV-2 public health emergency.  Safety protocols were in place, including screening questions prior to the visit, additional usage of staff PPE, and extensive cleaning of exam room while observing appropriate contact time as indicated for disinfecting solutions.

## 2020-07-04 ENCOUNTER — Encounter: Payer: Self-pay | Admitting: Internal Medicine

## 2020-07-04 ENCOUNTER — Ambulatory Visit (INDEPENDENT_AMBULATORY_CARE_PROVIDER_SITE_OTHER): Payer: Medicare Other | Admitting: Internal Medicine

## 2020-07-04 DIAGNOSIS — F419 Anxiety disorder, unspecified: Secondary | ICD-10-CM | POA: Diagnosis not present

## 2020-07-04 DIAGNOSIS — I10 Essential (primary) hypertension: Secondary | ICD-10-CM | POA: Diagnosis not present

## 2020-07-04 DIAGNOSIS — R443 Hallucinations, unspecified: Secondary | ICD-10-CM

## 2020-07-04 DIAGNOSIS — F3289 Other specified depressive episodes: Secondary | ICD-10-CM | POA: Diagnosis not present

## 2020-07-04 DIAGNOSIS — N1832 Chronic kidney disease, stage 3b: Secondary | ICD-10-CM | POA: Diagnosis not present

## 2020-07-04 DIAGNOSIS — K219 Gastro-esophageal reflux disease without esophagitis: Secondary | ICD-10-CM | POA: Diagnosis not present

## 2020-07-04 NOTE — Assessment & Plan Note (Signed)
Chronic GERD controlled Continue daily medication  

## 2020-07-04 NOTE — Assessment & Plan Note (Signed)
Chronic GFR slightly worse last time it was checked We will recheck when possible

## 2020-07-04 NOTE — Assessment & Plan Note (Signed)
Chronic Feels depressed depending on how she is feeling Currently following with psychiatrist and will let him manage medications Currently on Cymbalta and Lexapro and probably only needs to be on 1-we will send medication list to psychiatry so that they can adjust medications

## 2020-07-04 NOTE — Assessment & Plan Note (Signed)
Subacute Has seen neurology and is now following with psychiatry Still experiencing hallucinations Has follow-up with psychiatry for med management

## 2020-07-04 NOTE — Progress Notes (Signed)
Virtual Visit via Video Note  I connected with Gloria Warren on 07/04/20 at  8:15 AM EDT by a video enabled telemedicine application and verified that I am speaking with the correct person using two identifiers.   I discussed the limitations of evaluation and management by telemedicine and the availability of in person appointments. The patient expressed understanding and agreed to proceed.  Present for the visit:  Myself, Dr Billey Gosling, [name] and her son [name].  The patient is currently at home and I am in the office.    No referring provider.    History of Present Illness: The patient is here for follow up of their chronic medical problems  She is taking all of her medications as prescribed.    Dementia, agitation, hallucination:  She was hearing things that were not there, has become obsessesed and paranoid.  She thinks people are in her home and the neighbors are up to something.  Her anxiety has been increased.  She has mild memory issues.  She saw neuro early this month - ? Anxiety, agitation/paranoia related to wrosening hearing vs neurodegenerative dementia.   On seroquel at bedtime, lexapro and xanax.    She is now seeing a psychiatrist.  Some of her medication have been changed.     She is not on the hydrocodone anymore and the pain has increased.  She is taking tylenol only and it does not help the pain much.    She is not sleeping well at all.     Bp 140-145/60's   Review of Systems  Constitutional: Positive for malaise/fatigue. Negative for fever.  Respiratory: Positive for sputum production (in morning - uses albuterol - helps). Negative for cough, shortness of breath and wheezing.   Cardiovascular: Positive for leg swelling (controlled). Negative for chest pain and palpitations.  Gastrointestinal: Positive for abdominal pain (LLQ), constipation (typically constipated), diarrhea (yesterday - ? from respiridone) and nausea. Negative for blood in stool and  melena.  Psychiatric/Behavioral: Positive for depression. The patient has insomnia.       Social History   Socioeconomic History  . Marital status: Widowed    Spouse name: Not on file  . Number of children: 1  . Years of education: Not on file  . Highest education level: Bachelor's degree (e.g., BA, AB, BS)  Occupational History    Comment: RN  Tobacco Use  . Smoking status: Never Smoker  . Smokeless tobacco: Never Used  Vaping Use  . Vaping Use: Never used  Substance and Sexual Activity  . Alcohol use: Not Currently    Alcohol/week: 0.0 standard drinks    Comment:  rarely  . Drug use: No  . Sexual activity: Yes    Partners: Male    Birth control/protection: Surgical    Comment: TAH  Other Topics Concern  . Not on file  Social History Narrative   06/15/20 lives , caregiver in daytime, son with her in evenings, 24/7 care   Social Determinants of Health   Financial Resource Strain:   . Difficulty of Paying Living Expenses: Not on file  Food Insecurity:   . Worried About Charity fundraiser in the Last Year: Not on file  . Ran Out of Food in the Last Year: Not on file  Transportation Needs:   . Lack of Transportation (Medical): Not on file  . Lack of Transportation (Non-Medical): Not on file  Physical Activity:   . Days of Exercise per Week: Not on file  .  Minutes of Exercise per Session: Not on file  Stress:   . Feeling of Stress : Not on file  Social Connections:   . Frequency of Communication with Friends and Family: Not on file  . Frequency of Social Gatherings with Friends and Family: Not on file  . Attends Religious Services: Not on file  . Active Member of Clubs or Organizations: Not on file  . Attends Archivist Meetings: Not on file  . Marital Status: Not on file     Observations/Objective: Appears well in NAD Breathing normally Skin appears warm and dry  Assessment and Plan:  See Problem List for Assessment and Plan of chronic medical  problems.   Follow Up Instructions:    I discussed the assessment and treatment plan with the patient. The patient was provided an opportunity to ask questions and all were answered. The patient agreed with the plan and demonstrated an understanding of the instructions.   The patient was advised to call back or seek an in-person evaluation if the symptoms worsen or if the condition fails to improve as anticipated.  Time spent on telephone call: 20 minutes  Binnie Rail, MD

## 2020-07-04 NOTE — Assessment & Plan Note (Signed)
Chronic Fairly controlled Following with psychiatry now and they will manage medications

## 2020-07-04 NOTE — Assessment & Plan Note (Signed)
Chronic Blood pressure well controlled at home Continue current medication

## 2020-07-07 DIAGNOSIS — F2081 Schizophreniform disorder: Secondary | ICD-10-CM | POA: Diagnosis not present

## 2020-07-08 ENCOUNTER — Encounter: Payer: Self-pay | Admitting: Internal Medicine

## 2020-07-08 DIAGNOSIS — N939 Abnormal uterine and vaginal bleeding, unspecified: Secondary | ICD-10-CM

## 2020-07-11 DIAGNOSIS — N1832 Chronic kidney disease, stage 3b: Secondary | ICD-10-CM | POA: Diagnosis not present

## 2020-07-11 DIAGNOSIS — R600 Localized edema: Secondary | ICD-10-CM | POA: Diagnosis not present

## 2020-07-11 DIAGNOSIS — M150 Primary generalized (osteo)arthritis: Secondary | ICD-10-CM | POA: Diagnosis not present

## 2020-07-11 DIAGNOSIS — L03114 Cellulitis of left upper limb: Secondary | ICD-10-CM | POA: Diagnosis not present

## 2020-07-11 DIAGNOSIS — M2550 Pain in unspecified joint: Secondary | ICD-10-CM | POA: Diagnosis not present

## 2020-07-11 DIAGNOSIS — M542 Cervicalgia: Secondary | ICD-10-CM | POA: Diagnosis not present

## 2020-07-11 DIAGNOSIS — M0579 Rheumatoid arthritis with rheumatoid factor of multiple sites without organ or systems involvement: Secondary | ICD-10-CM | POA: Diagnosis not present

## 2020-07-11 NOTE — Telephone Encounter (Signed)
error 

## 2020-07-21 DIAGNOSIS — F2081 Schizophreniform disorder: Secondary | ICD-10-CM | POA: Diagnosis not present

## 2020-07-26 ENCOUNTER — Other Ambulatory Visit: Payer: Self-pay | Admitting: Internal Medicine

## 2020-07-26 ENCOUNTER — Other Ambulatory Visit: Payer: Self-pay | Admitting: Gastroenterology

## 2020-07-28 ENCOUNTER — Ambulatory Visit: Payer: Medicare Other | Admitting: Obstetrics & Gynecology

## 2020-07-31 DIAGNOSIS — Z20828 Contact with and (suspected) exposure to other viral communicable diseases: Secondary | ICD-10-CM | POA: Diagnosis not present

## 2020-08-04 DIAGNOSIS — F2081 Schizophreniform disorder: Secondary | ICD-10-CM | POA: Diagnosis not present

## 2020-09-06 DIAGNOSIS — F2081 Schizophreniform disorder: Secondary | ICD-10-CM | POA: Diagnosis not present

## 2020-09-09 ENCOUNTER — Telehealth: Payer: Self-pay | Admitting: Internal Medicine

## 2020-09-09 DIAGNOSIS — L89309 Pressure ulcer of unspecified buttock, unspecified stage: Secondary | ICD-10-CM

## 2020-09-09 MED ORDER — ALPRAZOLAM 0.25 MG PO TABS: 0.2500 mg | ORAL_TABLET | Freq: Every day | ORAL | 0 refills | Status: DC | PRN

## 2020-09-09 NOTE — Telephone Encounter (Signed)
Both ordered.

## 2020-09-09 NOTE — Telephone Encounter (Signed)
ALPRAZolam (XANAX) 0.25 MG tablet [id number] - [place], [place] - [address] AT [address] Phone:  [phone]  Fax:  [phone]     Requesting a refill   Also patient has bed sores on her bottom and they are getting deeper and her son his wondering if there is anything they can do or get something sent in for the bed sores.

## 2020-09-26 DIAGNOSIS — F2081 Schizophreniform disorder: Secondary | ICD-10-CM | POA: Diagnosis not present

## 2020-10-02 NOTE — Progress Notes (Deleted)
Subjective:    Patient ID: Gloria Warren, female    DOB: 26-Jul-1931, 87 y.o.   MRN: [id number]  HPI The patient is here for an acute visit.   Weakness, ? Iron low  --    Nasal congestion, headache last night   Bad digestive problems - gas and bloating, diarrhea.  abd discomfoft.  Boost brings it on.  anythign heavy brings it on.     No fever  Medications and allergies reviewed with patient and updated if appropriate.  Patient Active Problem List   Diagnosis Date Noted  . Anxiety 07/03/2020  . Hallucinations 07/03/2020  . Pressure sore 12/14/2019  . Abnormal CT of the abdomen 12/02/2019  . Rectal perforation ([place]) 12/02/2019  . Anemia 12/02/2019  . History of rectal bleeding 12/02/2019  . Acute lower GI bleeding 10/22/2019  . Depression 10/22/2019  . COVID-19 10/21/2019  . DVT (deep venous thrombosis) ([place]) 10/12/2019  . Pain and swelling of right lower leg 09/24/2019  . Nausea 06/29/2019  . Vitamin D deficiency 06/29/2019  . Osteoarthritis of both feet 09/25/2016  . Raynaud disease 09/25/2016  . Primary osteoarthritis of both knees 09/24/2016  . Primary osteoarthritis of both hands 09/24/2016  . Chronic kidney disease (CKD) stage G3b 09/24/2016  . Leg edema 11/28/2015  . GERD (gastroesophageal reflux disease) 11/28/2015  . Psoriasis 03/11/2013  . SKIN CANCER, HX OF 06/26/2010  . Allergic rhinitis 06/23/2009  . Prediabetes 06/23/2009  . Rheumatoid arthritis ([place]) 02/25/2008  . Hyperlipidemia 11/17/2007  . Essential hypertension 11/17/2007    Current Outpatient Medications on File Prior to Visit  Medication Sig Dispense Refill  . albuterol (PROVENTIL) (2.5 MG/3ML) 0.083% nebulizer solution USE 1 VIAL VIA NEBULIZER EVERY 6 HOURS AS NEEDED 75 mL 1  . ALPRAZolam (XANAX) 0.25 MG tablet Take 1 tablet (0.25 mg total) by mouth daily as needed for anxiety. 30 tablet 0  . benazepril (LOTENSIN) 10 MG tablet Take 0.5 tablets (5 mg total) by mouth daily. TAKE 1  TABLET(10 MG) BY MOUTH DAILY 90 tablet 1  . Calcium Carbonate (CALTRATE 600 PO) Take 600 mg by mouth 2 (two) times daily.      . cetirizine (ZYRTEC) 10 MG tablet Take 10 mg by mouth daily.    . DULoxetine (CYMBALTA) 30 MG capsule Take 30 mg by mouth daily.    Marland Kitchen escitalopram (LEXAPRO) 10 MG tablet Take 1 tablet (10 mg total) by mouth daily. 90 tablet 1  . ferrous gluconate (FERGON) 324 MG tablet TAKE 1 TABLET(324 MG) BY MOUTH DAILY WITH BREAKFAST 30 tablet 3  . furosemide (LASIX) 20 MG tablet Take 1 tablet (20 mg total) by mouth daily. 90 tablet 3  . hydroxychloroquine (PLAQUENIL) 200 MG tablet Take 200 mg by mouth daily.   0  . Multiple Vitamins-Minerals (VISION-VITE PRESERVE PO) Take by mouth.    Marland Kitchen omeprazole (PRILOSEC) 40 MG capsule TAKE 1 CAPSULE(40 MG) BY MOUTH DAILY 30 capsule 3  . predniSONE (DELTASONE) 5 MG tablet Take 7.5 mg by mouth daily with breakfast.     . promethazine (PHENERGAN) 12.5 MG tablet TAKE 1 TABLET(12.5 MG) BY MOUTH EVERY 8 HOURS AS NEEDED FOR NAUSEA OR VOMITING 30 tablet 0  . risperiDONE (RISPERDAL) 0.5 MG tablet Take 0.5 mg by mouth at bedtime.    . rivastigmine (EXELON) 4.6 mg/24hr 4.6 mg daily.    . simethicone (MYLICON) 956 MG chewable tablet Chew 125 mg by mouth every 6 (six) hours as needed for flatulence.  No current facility-administered medications on file prior to visit.    Past Medical History:  Diagnosis Date  . Allergic rhinitis   . Decreased GFR   . DVT (deep venous thrombosis) ([place])   . Endometriosis   . History of skin cancer    squamous & basal cell ;Dr Denna Haggard  . Hyperlipidemia   . Hypertension   . Osteoarthritis   . Psoriasis   . Raynaud disease   . Rheumatoid arthritis(714.0)    Dr. Estanislado Pandy  . UTI (lower urinary tract infection) 5/15   Proteus , R to Nitrofurantoin & Penicillin    Past Surgical History:  Procedure Laterality Date  . APPENDECTOMY     age 56  . CATARACT EXTRACTION Bilateral   . COLONOSCOPY     Neg 2;  [place] , [place]  . G 2  P 2    . TONSILLECTOMY    . TOTAL ABDOMINAL HYSTERECTOMY     age 52 for endometriosis (no BSO)  . VENA CAVA FILTER PLACEMENT N/A 10/22/2019   Procedure: INSERTION VENA-CAVA FILTER;  Surgeon: Marty Heck, MD;  Location: [facility];  Service: Vascular;  Laterality: N/A;  . VENOGRAM N/A 10/22/2019   Procedure: Venogram;  Surgeon: Marty Heck, MD;  Location: [facility];  Service: Vascular;  Laterality: N/A;    Social History   Socioeconomic History  . Marital status: Widowed    Spouse name: Not on file  . Number of children: 1  . Years of education: Not on file  . Highest education level: Bachelor's degree (e.g., BA, AB, BS)  Occupational History    Comment: RN  Tobacco Use  . Smoking status: Never Smoker  . Smokeless tobacco: Never Used  Vaping Use  . Vaping Use: Never used  Substance and Sexual Activity  . Alcohol use: Not Currently    Alcohol/week: 0.0 standard drinks    Comment:  rarely  . Drug use: No  . Sexual activity: Yes    Partners: Male    Birth control/protection: Surgical    Comment: TAH  Other Topics Concern  . Not on file  Social History Narrative   06/15/20 lives , caregiver in daytime, son with her in evenings, 24/7 care   Social Determinants of Health   Financial Resource Strain:   . Difficulty of Paying Living Expenses: Not on file  Food Insecurity:   . Worried About Charity fundraiser in the Last Year: Not on file  . Ran Out of Food in the Last Year: Not on file  Transportation Needs:   . Lack of Transportation (Medical): Not on file  . Lack of Transportation (Non-Medical): Not on file  Physical Activity:   . Days of Exercise per Week: Not on file  . Minutes of Exercise per Session: Not on file  Stress:   . Feeling of Stress : Not on file  Social Connections:   . Frequency of Communication with Friends and Family: Not on file  . Frequency of Social Gatherings with Friends and Family: Not on file  . Attends  Religious Services: Not on file  . Active Member of Clubs or Organizations: Not on file  . Attends Archivist Meetings: Not on file  . Marital Status: Not on file    Family History  Problem Relation Age of Onset  . Breast cancer Mother 93  . Hypertension Mother   . Gout Mother   . Hypertension Father   . Heart attack Father 71  . Stroke  Sister        doing well  . Diabetes Maternal Aunt   . Hypertension Son   . Stroke Maternal Grandmother        late 34s  . Colon cancer Neg Hx   . Esophageal cancer Neg Hx   . Inflammatory bowel disease Neg Hx   . Liver disease Neg Hx   . Pancreatic cancer Neg Hx   . Rectal cancer Neg Hx   . Stomach cancer Neg Hx     Review of Systems  Constitutional: Negative for fever.  HENT: Positive for congestion. Negative for ear pain and sore throat.   Respiratory: Negative for cough, shortness of breath and wheezing.   Cardiovascular: Negative for chest pain and palpitations.  Gastrointestinal: Positive for abdominal distention, abdominal pain, diarrhea and nausea.  Neurological: Positive for headaches.       Objective:  There were no vitals filed for this visit. BP Readings from Last 3 Encounters:  06/15/20 112/80  06/06/20 (!) 118/62  03/08/20 112/76   Wt Readings from Last 3 Encounters:  03/08/20 129 lb (58.5 kg)  10/20/19 129 lb (58.5 kg)  10/12/19 129 lb (58.5 kg)   There is no height or weight on file to calculate BMI.   Physical Exam    Constitutional: Appears well-developed and well-nourished. No distress.  Head: Normocephalic and atraumatic.  Neck: Neck supple. No tracheal deviation present. No thyromegaly present.  No cervical lymphadenopathy Cardiovascular: Normal rate, regular rhythm and normal heart sounds.  No murmur heard. No carotid bruit .  No edema Pulmonary/Chest: Effort normal and breath sounds normal. No respiratory distress. No has no wheezes. No rales.  Skin: Skin is warm and dry. Not diaphoretic.    Psychiatric: Normal mood and affect. Behavior is normal.       Assessment & Plan:    See Problem List for Assessment and Plan of chronic medical problems.    This visit occurred during the SARS-CoV-2 public health emergency.  Safety protocols were in place, including screening questions prior to the visit, additional usage of staff PPE, and extensive cleaning of exam room while observing appropriate contact time as indicated for disinfecting solutions.

## 2020-10-03 ENCOUNTER — Telehealth (INDEPENDENT_AMBULATORY_CARE_PROVIDER_SITE_OTHER): Payer: Medicare Other | Admitting: Internal Medicine

## 2020-10-03 ENCOUNTER — Encounter: Payer: Self-pay | Admitting: Internal Medicine

## 2020-10-03 DIAGNOSIS — Z862 Personal history of diseases of the blood and blood-forming organs and certain disorders involving the immune mechanism: Secondary | ICD-10-CM | POA: Diagnosis not present

## 2020-10-03 DIAGNOSIS — R531 Weakness: Secondary | ICD-10-CM | POA: Diagnosis not present

## 2020-10-03 DIAGNOSIS — R1084 Generalized abdominal pain: Secondary | ICD-10-CM

## 2020-10-03 DIAGNOSIS — R109 Unspecified abdominal pain: Secondary | ICD-10-CM | POA: Insufficient documentation

## 2020-10-03 DIAGNOSIS — J019 Acute sinusitis, unspecified: Secondary | ICD-10-CM | POA: Diagnosis not present

## 2020-10-03 DIAGNOSIS — K219 Gastro-esophageal reflux disease without esophagitis: Secondary | ICD-10-CM

## 2020-10-03 MED ORDER — AMOXICILLIN-POT CLAVULANATE 875-125 MG PO TABS: 1.0000 | ORAL_TABLET | Freq: Two times a day (BID) | ORAL | 0 refills | Status: DC

## 2020-10-03 MED ORDER — OMEPRAZOLE 40 MG PO CPDR
40.0000 mg | DELAYED_RELEASE_CAPSULE | Freq: Two times a day (BID) | ORAL | 1 refills | Status: DC
Start: 2020-10-03 — End: 2020-12-22

## 2020-10-03 NOTE — Assessment & Plan Note (Signed)
Acute She has several symptoms consistent with possible sinus infection It is very difficult to know for sure Advised saline nasal spray Consider starting a humidifier if the air is dry Will start Augmentin twice daily x10 days

## 2020-10-03 NOTE — Progress Notes (Signed)
Virtual Visit via Video Note  I connected with Gloria Warren on 10/03/20 at  3:30 PM EST by a video enabled telemedicine application and verified that I am speaking with the correct person using two identifiers.   I discussed the limitations of evaluation and management by telemedicine and the availability of in person appointments. The patient expressed understanding and agreed to proceed.  Present for the visit:  Myself, Dr Billey Gosling, [name].  Her son [name] is also present and helps provide the history.  The patient is currently at home and I am in the office.    No referring provider.    History of Present Illness: This visit is an acute visit.  She has been experiencing nasal congestion and headaches.  Possibly some sinus pressure.  She states dry mouth, but no sore throat.  She has been experiencing some bad digestive problems.  She states gas, increased bloating and diarrhea at times.  She is not going to the bathroom as normally as usual, but when she goes it is a good amount.  The gas, bloating and diarrhea could be brought on by certain foods-her son states whenever she drinks boost, tomato products or eat something heavy.  She has been afraid to eat because of the symptoms.  She does have a history of diverticulitis.  She does feel nauseous at times and has thrown up some stomach acid.  She denies any reflux.  She does have a moderate hiatal hernia.  She is on Risperdal and Exelon patch via psychiatry.  Overall she has become more weak.  She is not able to eat on her own.  She is not walking.  She does have a history of iron deficiency anemia and they were concerned if that was causing some of her symptoms.      Review of Systems  Constitutional: Negative for fever.  HENT: Positive for congestion. Negative for ear pain and sore throat.   Respiratory: Negative for cough, shortness of breath and wheezing.   Cardiovascular: Negative for chest pain and  palpitations.  Gastrointestinal: Positive for abdominal distention, abdominal pain, diarrhea and nausea.  Neurological: Positive for headaches.     Social History   Socioeconomic History  . Marital status: Widowed    Spouse name: Not on file  . Number of children: 1  . Years of education: Not on file  . Highest education level: Bachelor's degree (e.g., BA, AB, BS)  Occupational History    Comment: RN  Tobacco Use  . Smoking status: Never Smoker  . Smokeless tobacco: Never Used  Vaping Use  . Vaping Use: Never used  Substance and Sexual Activity  . Alcohol use: Not Currently    Alcohol/week: 0.0 standard drinks    Comment:  rarely  . Drug use: No  . Sexual activity: Yes    Partners: Male    Birth control/protection: Surgical    Comment: TAH  Other Topics Concern  . Not on file  Social History Narrative   06/15/20 lives , caregiver in daytime, son with her in evenings, 24/7 care   Social Determinants of Health   Financial Resource Strain:   . Difficulty of Paying Living Expenses: Not on file  Food Insecurity:   . Worried About Charity fundraiser in the Last Year: Not on file  . Ran Out of Food in the Last Year: Not on file  Transportation Needs:   . Lack of Transportation (Medical): Not on file  . Lack of  Transportation (Non-Medical): Not on file  Physical Activity:   . Days of Exercise per Week: Not on file  . Minutes of Exercise per Session: Not on file  Stress:   . Feeling of Stress : Not on file  Social Connections:   . Frequency of Communication with Friends and Family: Not on file  . Frequency of Social Gatherings with Friends and Family: Not on file  . Attends Religious Services: Not on file  . Active Member of Clubs or Organizations: Not on file  . Attends Archivist Meetings: Not on file  . Marital Status: Not on file     Observations/Objective: Elderly female, chronically ill-appearing, cushingoid appearance Sounds congested when she  breathes, but breathing is normal Skin appears warm and dry    Assessment and Plan:  See Problem List for Assessment and Plan of chronic medical problems.   Follow Up Instructions:    I discussed the assessment and treatment plan with the patient. The patient was provided an opportunity to ask questions and all were answered. The patient agreed with the plan and demonstrated an understanding of the instructions.   The patient was advised to call back or seek an in-person evaluation if the symptoms worsen or if the condition fails to improve as anticipated.    Binnie Rail, MD

## 2020-10-03 NOTE — Assessment & Plan Note (Signed)
Acute Diffuse abdominal pain associated with abdominal bloating, increased gas,?  Constipation Difficult to know what is going on.  Possible diverticulitis, possible constipation, possible relation to uncontrolled GERD or medication side effects We will be starting Augmentin for sinus infection, which would also help treat diverticulitis Discussed that she may need a stool softener if there is some constipation We will check CBC, CMP Omeprazole being increased to twice daily If symptoms do not improve she may need to be evaluated in person

## 2020-10-03 NOTE — Assessment & Plan Note (Signed)
Acute on chronic Overall progressively getting weaker, not walking A lot of this is multifactorial, but her severe rheumatoid arthritis is significantly contributing History of iron deficiency anemia and will check CBC, CMP, iron levels-her son will try to get her to the lab We will treat some of the acute problems and see if her overall condition improves Her son will keep me updated

## 2020-10-03 NOTE — Assessment & Plan Note (Signed)
History of iron deficiency anemia Having increased weakness We will check CBC, CMP and iron level

## 2020-10-03 NOTE — Assessment & Plan Note (Signed)
Chronic ?  Controlled or not She is having nausea and spitting up stomach acid She has a moderate hiatal hernia Increase omeprazole to 40 mg twice daily

## 2020-10-11 ENCOUNTER — Other Ambulatory Visit: Payer: Self-pay | Admitting: Internal Medicine

## 2020-10-13 DIAGNOSIS — I498 Other specified cardiac arrhythmias: Secondary | ICD-10-CM | POA: Diagnosis not present

## 2020-10-13 DIAGNOSIS — I491 Atrial premature depolarization: Secondary | ICD-10-CM | POA: Diagnosis not present

## 2020-10-13 DIAGNOSIS — R197 Diarrhea, unspecified: Secondary | ICD-10-CM | POA: Diagnosis not present

## 2020-10-13 DIAGNOSIS — F209 Schizophrenia, unspecified: Secondary | ICD-10-CM | POA: Diagnosis present

## 2020-10-13 DIAGNOSIS — R000 Tachycardia, unspecified: Secondary | ICD-10-CM | POA: Diagnosis not present

## 2020-10-13 DIAGNOSIS — Z7901 Long term (current) use of anticoagulants: Secondary | ICD-10-CM | POA: Diagnosis not present

## 2020-10-13 DIAGNOSIS — Z79899 Other long term (current) drug therapy: Secondary | ICD-10-CM | POA: Diagnosis not present

## 2020-10-13 DIAGNOSIS — I9589 Other hypotension: Secondary | ICD-10-CM | POA: Diagnosis present

## 2020-10-13 DIAGNOSIS — R0789 Other chest pain: Secondary | ICD-10-CM | POA: Diagnosis not present

## 2020-10-13 DIAGNOSIS — Z888 Allergy status to other drugs, medicaments and biological substances status: Secondary | ICD-10-CM | POA: Diagnosis not present

## 2020-10-13 DIAGNOSIS — Z7401 Bed confinement status: Secondary | ICD-10-CM | POA: Diagnosis not present

## 2020-10-13 DIAGNOSIS — Z20822 Contact with and (suspected) exposure to covid-19: Secondary | ICD-10-CM | POA: Diagnosis not present

## 2020-10-13 DIAGNOSIS — M069 Rheumatoid arthritis, unspecified: Secondary | ICD-10-CM | POA: Diagnosis present

## 2020-10-13 DIAGNOSIS — T380X5A Adverse effect of glucocorticoids and synthetic analogues, initial encounter: Secondary | ICD-10-CM | POA: Diagnosis present

## 2020-10-13 DIAGNOSIS — Z452 Encounter for adjustment and management of vascular access device: Secondary | ICD-10-CM | POA: Diagnosis not present

## 2020-10-13 DIAGNOSIS — Z7982 Long term (current) use of aspirin: Secondary | ICD-10-CM | POA: Diagnosis not present

## 2020-10-13 DIAGNOSIS — E861 Hypovolemia: Secondary | ICD-10-CM | POA: Diagnosis not present

## 2020-10-13 DIAGNOSIS — R079 Chest pain, unspecified: Secondary | ICD-10-CM | POA: Diagnosis not present

## 2020-10-13 DIAGNOSIS — R918 Other nonspecific abnormal finding of lung field: Secondary | ICD-10-CM | POA: Diagnosis not present

## 2020-10-13 DIAGNOSIS — L0390 Cellulitis, unspecified: Secondary | ICD-10-CM | POA: Diagnosis not present

## 2020-10-13 DIAGNOSIS — J9811 Atelectasis: Secondary | ICD-10-CM | POA: Diagnosis not present

## 2020-10-13 DIAGNOSIS — R601 Generalized edema: Secondary | ICD-10-CM | POA: Diagnosis not present

## 2020-10-13 DIAGNOSIS — M25519 Pain in unspecified shoulder: Secondary | ICD-10-CM | POA: Diagnosis not present

## 2020-10-13 DIAGNOSIS — R509 Fever, unspecified: Secondary | ICD-10-CM | POA: Diagnosis not present

## 2020-10-13 DIAGNOSIS — R0602 Shortness of breath: Secondary | ICD-10-CM | POA: Diagnosis not present

## 2020-10-13 DIAGNOSIS — D508 Other iron deficiency anemias: Secondary | ICD-10-CM | POA: Diagnosis not present

## 2020-10-13 DIAGNOSIS — R0902 Hypoxemia: Secondary | ICD-10-CM | POA: Diagnosis not present

## 2020-10-13 DIAGNOSIS — I959 Hypotension, unspecified: Secondary | ICD-10-CM | POA: Diagnosis not present

## 2020-10-13 DIAGNOSIS — D72828 Other elevated white blood cell count: Secondary | ICD-10-CM | POA: Diagnosis present

## 2020-10-13 DIAGNOSIS — R609 Edema, unspecified: Secondary | ICD-10-CM | POA: Diagnosis not present

## 2020-10-13 DIAGNOSIS — R0689 Other abnormalities of breathing: Secondary | ICD-10-CM | POA: Diagnosis not present

## 2020-10-13 DIAGNOSIS — Z882 Allergy status to sulfonamides status: Secondary | ICD-10-CM | POA: Diagnosis not present

## 2020-10-18 ENCOUNTER — Other Ambulatory Visit: Payer: Self-pay | Admitting: Internal Medicine

## 2020-10-18 ENCOUNTER — Encounter: Payer: Self-pay | Admitting: Internal Medicine

## 2020-10-18 DIAGNOSIS — F2081 Schizophreniform disorder: Secondary | ICD-10-CM | POA: Insufficient documentation

## 2020-10-24 ENCOUNTER — Telehealth: Payer: Self-pay | Admitting: Internal Medicine

## 2020-10-24 NOTE — Telephone Encounter (Signed)
    Patient recently discharged from [facility].  Patient still having some diarrhea. Gloria Warren seeking advice. Phone [phone]

## 2020-10-24 NOTE — Telephone Encounter (Signed)
Looks like she initially had some diarrhea when she went to the hospital, but then resolved.  When she on any antibiotics while in the hospital--that can often cause diarrhea.  any new medications?  She should try to drink as much fluids as possible and eating very bland diet-toast, crackers, soup, applesauce, bananas  If the diarrhea continues we may need to check a stool sample to rule out C. difficile diarrhea, which can come from antibiotics

## 2020-10-26 NOTE — Telephone Encounter (Signed)
Spoke with son today and info given 

## 2020-10-26 NOTE — Telephone Encounter (Signed)
No stool studies were done.  They stated that her diarrhea stopped before she was officially admitted and did not have any further diarrhea during the hospitalization.  It seems less likely that it seeded since she did not have any antibiotics during the hospitalization.  In that case is okay to use Imodium.  This will hopefully give her some relief from the diarrhea.  I am seeing a lot of viral diarrhea illness.  In which case it is only supportive care-lots of fluids, bland diet and the Imodium.

## 2020-11-08 ENCOUNTER — Telehealth: Payer: Self-pay | Admitting: Internal Medicine

## 2020-11-08 MED ORDER — ALPRAZOLAM 0.25 MG PO TABS
ORAL_TABLET | ORAL | 0 refills | Status: DC
Start: 2020-11-08 — End: 2020-11-14

## 2020-11-08 NOTE — Telephone Encounter (Signed)
   Refill request for ALPRAZolam (XANAX) 0.25 MG tablet Pharmacy:[id number] - [place], [place] - [address]

## 2020-11-14 ENCOUNTER — Telehealth: Payer: Self-pay | Admitting: Internal Medicine

## 2020-11-14 ENCOUNTER — Other Ambulatory Visit: Payer: Self-pay | Admitting: Gastroenterology

## 2020-11-14 MED ORDER — CLONAZEPAM 0.5 MG PO TABS: 0.2500 mg | ORAL_TABLET | Freq: Every day | ORAL | 0 refills | Status: DC | PRN

## 2020-11-14 NOTE — Telephone Encounter (Signed)
Call son - let him know her alprazolam is not covered - we can change to clonazepam - this would be similar but longer actiing

## 2020-12-08 ENCOUNTER — Other Ambulatory Visit: Payer: Self-pay | Admitting: Internal Medicine

## 2020-12-09 DIAGNOSIS — F2081 Schizophreniform disorder: Secondary | ICD-10-CM | POA: Diagnosis not present

## 2020-12-19 ENCOUNTER — Telehealth: Payer: Self-pay | Admitting: Internal Medicine

## 2020-12-19 MED ORDER — ALPRAZOLAM 0.25 MG PO TABS: 0.2500 mg | ORAL_TABLET | Freq: Every day | ORAL | 0 refills | Status: DC

## 2020-12-19 NOTE — Telephone Encounter (Signed)
Patients son called and said that clonazePAM (KLONOPIN) 0.5 MG tablet is not working and the patient has not been sleeping the last few nights. He was wondering if ALPRAZolam (XANAX) 0.25 MG tablet could be called into [place], [place] - [address]

## 2020-12-19 NOTE — Telephone Encounter (Signed)
sent 

## 2020-12-21 ENCOUNTER — Other Ambulatory Visit: Payer: Self-pay | Admitting: Internal Medicine

## 2021-01-18 ENCOUNTER — Telehealth: Payer: Self-pay | Admitting: Internal Medicine

## 2021-01-18 MED ORDER — ALPRAZOLAM 0.25 MG PO TABS
0.2500 mg | ORAL_TABLET | Freq: Every day | ORAL | 2 refills | Status: DC
Start: 2021-01-18 — End: 2021-02-22

## 2021-01-18 NOTE — Telephone Encounter (Signed)
1.Medication Requested: ALPRAZolam (XANAX) 0.25 MG tablet    2. Pharmacy (Name, Street, City): [id number] - [place], [place] - [address]  3. On Med List: yes   4. Last Visit with PCP: 10-03-20  5. Next visit date with PCP: n/a    Agent: Please be advised that RX refills may take up to 3 business days. We ask that you follow-up with your pharmacy.  

## 2021-01-27 ENCOUNTER — Telehealth: Payer: Self-pay | Admitting: Internal Medicine

## 2021-01-27 NOTE — Progress Notes (Signed)
  Chronic Care Management   Outreach Note  01/27/2021 Name: RANELL FINELLI MRN: [id number] DOB: 1931-01-15  Referred by: Binnie Rail, MD Reason for referral : No chief complaint on file.   An unsuccessful telephone outreach was attempted today. The patient was referred to the pharmacist for assistance with care management and care coordination.   Follow Up Plan:   Carley Perdue [employer] Scheduler

## 2021-02-03 ENCOUNTER — Telehealth: Payer: Self-pay

## 2021-02-03 NOTE — Telephone Encounter (Signed)
Spoke with patient's son [name] and scheduled an in-person Palliative Consult for 02/20/21 @ 3PM. [name] requested a Monday afternoon appointment.   COVID screening was negative. No pets in home. Patient lives alone but, has caregivers 24/7.  Consent obtained; updated Outlook/Netsmart/Team List and Epic.  Family is aware they may be receiving a call from NP the day before or day of to confirm appointment.

## 2021-02-20 ENCOUNTER — Other Ambulatory Visit: Payer: Medicare Other | Admitting: Student

## 2021-02-20 ENCOUNTER — Other Ambulatory Visit: Payer: Self-pay

## 2021-02-20 DIAGNOSIS — F419 Anxiety disorder, unspecified: Secondary | ICD-10-CM | POA: Diagnosis not present

## 2021-02-20 DIAGNOSIS — R531 Weakness: Secondary | ICD-10-CM

## 2021-02-20 DIAGNOSIS — M0579 Rheumatoid arthritis with rheumatoid factor of multiple sites without organ or systems involvement: Secondary | ICD-10-CM

## 2021-02-20 DIAGNOSIS — L89622 Pressure ulcer of left heel, stage 2: Secondary | ICD-10-CM | POA: Diagnosis not present

## 2021-02-20 DIAGNOSIS — R600 Localized edema: Secondary | ICD-10-CM

## 2021-02-20 DIAGNOSIS — R52 Pain, unspecified: Secondary | ICD-10-CM

## 2021-02-20 NOTE — Progress Notes (Signed)
[facility] Consult Note Telephone: [phone]  Fax: [phone]    Date of encounter: 02/20/21 PATIENT NAME: Gloria Warren [address] [place] [address]   [phone] (home)  DOB: 05-20-1931 MRN: [id number] PRIMARY CARE PROVIDER:    Binnie Rail, MD,  [place] [id number] [phone]  REFERRING PROVIDER:   Binnie Rail, MD [address] [place],  [place] [address] [phone]  RESPONSIBLE PARTY:    Contact Information    Name Relation Home Work Mobile   Gloria, Warren Son   [phone]   Gloria Warren Granddaughter   [phone]       I met face to face with patient and family in the home. Palliative Care was asked to follow this patient by consultation request of  Warren, Gloria Lick, MD to address advance care planning and complex medical decision making. This is the initial visit.                                     ASSESSMENT AND PLAN / RECOMMENDATIONS:   Advance Care Planning/Goals of Care: Goals include to maximize quality of life and symptom management. Our advance care planning conversation included a discussion about:     The value and importance of advance care planning   Experiences with loved ones who have been seriously ill or have died   Exploration of personal, cultural or spiritual beliefs that might influence medical decisions   Exploration of goals of care in the event of a sudden injury or illness   Identification and preparation of a healthcare agent   Introduced MOST form; patient and son to review. Will complete on next visit  CODE STATUS: Full Code  Symptom Management/Plan:  Rheumatoid arthritis-patient with generalized pain, pain alternates to bilateral hips. Continue acetaminophen 1049m TID recommended; continue plaquenil 2051mdaily, prednisone 7.62m74maily. Patient with generalized weakness- Recommend hoyer lift for transfers. Patient requires  maximum assistance for all transfers due to inability to bear weight secondary to Rheumatoid arthritis, generalized weakness. Patient also with hx of skin breakdown and would benefit with being out of bed daily to wheel chair or recliner.   LE edema-chronic lower extremity edema. Continue furosemide 68m13mily; patient encouraged to keep her legs elevated in recliner or in bed.   Anxiety/depression-mood is stable, anxiety presently managed; continue alprazolam QHS and risperdal QHS, escitalopram and duloxetine as directed.   Left heel wound-stage 2 pressure injury to left medial heel. Wound bed is pink, serosanguinous drainage. Cleanse with normal saline, cover with foam dressing. Change dressing every 7 days and PRN. Free float heels.  Follow up Palliative Care Visit: Palliative care will continue to follow for complex medical decision making, advance care planning, and clarification of goals. Return in 4 weeks or prn.  I spent 60 minutes providing this consultation. More than 50% of the time in this consultation was spent in counseling and care coordination.    PPS: 40%  HOSPICE ELIGIBILITY/DIAGNOSIS: TBD  Chief Complaint: Palliative Medicine initial consult; rheumatoid arthritis  HISTORY OF PRESENT ILLNESS:  AudrKAZZANDRA DESAULNIERSa 89 y46. year old female  with Rheumatoid arthritis, leg edema, anxiety, depression, schizophreniform disorder. She currently resides at home; son TerrCoralyn Markwith patient each evening/night. She has private caregivers during the day. Patient reports generalized pain; pain also alternates between her hips/legs. She takes acetaminophen with relief.  She is non-ambulatory; son reports patient stopped walking several years ago, but was able to stand and bear weight up until a year ago. She is out of bed daily to w/c then transferred to recliner. She is changed in her recliner. She endorses a good appetite; no weight loss reported. GERD symptoms have improved since her  omeprazole was increased to BID. She does have fragile skin, occasional skin tears and breakdown to buttocks. Currently has skin tear to RLE and wound to left medial heel. No recent infections, ER visits or hospitalizations.   History obtained from review of EMR, discussion with primary team, and interview with family, facility staff/caregiver and/or Gloria Warren.  I reviewed available labs, medications, imaging, studies and related documents from the EMR.  Records reviewed and summarized above.   ROS  General: NAD EYES: denies vision changes ENMT: denies dysphagia Cardiovascular: denies chest pain, denies DOE Pulmonary: occasional cough, denies increased SOB Abdomen: endorses good appetite, denies constipation, incontinence of bowel GU: denies dysuria, incontinence of urine MSK: generalized weakness, no falls reported Neurological: generalized pain, denies insomnia Psych: Endorses positive mood Heme/lymph/immuno: denies bruises, abnormal bleeding  Physical Exam: Pulse 68, resp 16, 104/60 Constitutional: NAD General: frail appearing EYES: anicteric sclera, lids intact, no discharge  ENMT: intact hearing, oral mucous membranes moist, dentition intact CV: S1S2, RRR, 1-2 +LE edema Pulmonary: LCTA, no increased work of breathing, no cough, room air Abdomen: intake good, normo-active BS + 4 quadrants, soft and non tender GU: deferred MSK: no sarcopenia, moves all extremities, non- ambulatory Skin: cool and dry, flaky skin. Dressing to RLE CDI; dime size stage 2 wound to left medial heel, wound bed pink, peri area pink, intact, small amount of serosanguineous drainage present, no odor Neuro: generalized weakness,  Psych: non-anxious affect, A and O x 3 Hem/lymph/immuno: no widespread bruising   CURRENT PROBLEM LIST:  Patient Active Problem List   Diagnosis Date Noted  . Schizophreniform disorder ([place]) 10/18/2020  . Acute sinus infection 10/03/2020  . Weakness 10/03/2020  . History  of iron deficiency anemia 10/03/2020  . Abdominal pain 10/03/2020  . Anxiety 07/03/2020  . Hallucinations 07/03/2020  . Pressure sore 12/14/2019  . Abnormal CT of the abdomen 12/02/2019  . Rectal perforation ([place]) 12/02/2019  . Anemia 12/02/2019  . History of rectal bleeding 12/02/2019  . Acute lower GI bleeding 10/22/2019  . Depression 10/22/2019  . COVID-19 10/21/2019  . DVT (deep venous thrombosis) ([place]) 10/12/2019  . Pain and swelling of right lower leg 09/24/2019  . Nausea 06/29/2019  . Vitamin D deficiency 06/29/2019  . Osteoarthritis of both feet 09/25/2016  . Raynaud disease 09/25/2016  . Primary osteoarthritis of both knees 09/24/2016  . Primary osteoarthritis of both hands 09/24/2016  . Chronic kidney disease (CKD) stage G3b 09/24/2016  . Leg edema 11/28/2015  . GERD (gastroesophageal reflux disease) 11/28/2015  . Psoriasis 03/11/2013  . SKIN CANCER, HX OF 06/26/2010  . Allergic rhinitis 06/23/2009  . Prediabetes 06/23/2009  . Rheumatoid arthritis ([place]) 02/25/2008  . Hyperlipidemia 11/17/2007  . Essential hypertension 11/17/2007   PAST MEDICAL HISTORY:  Active Ambulatory Problems    Diagnosis Date Noted  . Hyperlipidemia 11/17/2007  . Essential hypertension 11/17/2007  . Allergic rhinitis 06/23/2009  . Rheumatoid arthritis ([place]) 02/25/2008  . Prediabetes 06/23/2009  . SKIN CANCER, HX OF 06/26/2010  . Psoriasis 03/11/2013  . Leg edema 11/28/2015  . GERD (gastroesophageal reflux disease) 11/28/2015  . Primary osteoarthritis of both knees 09/24/2016  . Primary osteoarthritis of both  hands 09/24/2016  . Chronic kidney disease (CKD) stage G3b 09/24/2016  . Osteoarthritis of both feet 09/25/2016  . Raynaud disease 09/25/2016  . Nausea 06/29/2019  . Vitamin D deficiency 06/29/2019  . Pain and swelling of right lower leg 09/24/2019  . DVT (deep venous thrombosis) ([facility]) 10/12/2019  . COVID-19 10/21/2019  . Acute lower GI bleeding 10/22/2019  . Depression  10/22/2019  . Abnormal CT of the abdomen 12/02/2019  . Rectal perforation ([facility]) 12/02/2019  . Anemia 12/02/2019  . History of rectal bleeding 12/02/2019  . Pressure sore 12/14/2019  . Anxiety 07/03/2020  . Hallucinations 07/03/2020  . Acute sinus infection 10/03/2020  . Weakness 10/03/2020  . History of iron deficiency anemia 10/03/2020  . Abdominal pain 10/03/2020  . Schizophreniform disorder ([facility]) 10/18/2020   Resolved Ambulatory Problems    Diagnosis Date Noted  . UNS ADVRS EFF UNS RX MEDICINAL&BIOLOGICAL SBSTNC 02/25/2008  . Dysuria 03/25/2014  . High risk medication use 09/24/2016  . Gastroenteritis 12/09/2016  . SIRS (systemic inflammatory response syndrome) ([facility]) 12/09/2016  . AKI (acute kidney injury) ([facility]) 12/09/2016  . Non-intractable vomiting 12/02/2019  . Infected ulcer of skin ([facility]) 12/14/2019   Past Medical History:  Diagnosis Date  . Allergic rhinitis   . Decreased GFR   . Endometriosis   . History of skin cancer   . Hypertension   . Osteoarthritis   . Rheumatoid arthritis(714.0)   . UTI (lower urinary tract infection) 5/15   SOCIAL HX:  Social History   Tobacco Use  . Smoking status: Never Smoker  . Smokeless tobacco: Never Used  Substance Use Topics  . Alcohol use: Not Currently    Alcohol/week: 0.0 standard drinks    Comment:  rarely   FAMILY HX:  Family History  Problem Relation Age of Onset  . Breast cancer Mother 29  . Hypertension Mother   . Gout Mother   . Hypertension Father   . Heart attack Father 7  . Stroke Sister        doing well  . Diabetes Maternal Aunt   . Hypertension Son   . Stroke Maternal Grandmother        late 42s  . Colon cancer Neg Hx   . Esophageal cancer Neg Hx   . Inflammatory bowel disease Neg Hx   . Liver disease Neg Hx   . Pancreatic cancer Neg Hx   . Rectal cancer Neg Hx   . Stomach cancer Neg Hx       ALLERGIES:  Allergies  Allergen Reactions  . Arava [Leflunomide]     Liver & kidney dysfunction   . Sulfonamide Derivatives     Itching & rash  . Remicade [Infliximab] Hives and Itching     PERTINENT MEDICATIONS:  Outpatient Encounter Medications as of 02/20/2021  Medication Sig  . albuterol (PROVENTIL) (2.5 MG/3ML) 0.083% nebulizer solution USE 1 VIAL VIA NEBULIZER EVERY 6 HOURS AS NEEDED  . ALPRAZolam (XANAX) 0.25 MG tablet Take 1 tablet (0.25 mg total) by mouth at bedtime.  . benazepril (LOTENSIN) 10 MG tablet TAKE 1 TABLET(10 MG) BY MOUTH DAILY  . Calcium Carbonate (CALTRATE 600 PO) Take 600 mg by mouth 2 (two) times daily.    . cetirizine (ZYRTEC) 10 MG tablet Take 10 mg by mouth daily.  . DULoxetine (CYMBALTA) 30 MG capsule Take 30 mg by mouth daily.  Marland Kitchen escitalopram (LEXAPRO) 10 MG tablet TAKE 1 TABLET(10 MG) BY MOUTH DAILY  . ferrous gluconate (FERGON) 324 MG tablet TAKE 1  TABLET(324 MG) BY MOUTH DAILY WITH BREAKFAST  . furosemide (LASIX) 20 MG tablet Take 1 tablet (20 mg total) by mouth daily.  . hydroxychloroquine (PLAQUENIL) 200 MG tablet Take 200 mg by mouth daily.   . Multiple Vitamins-Minerals (VISION-VITE PRESERVE PO) Take by mouth.  Marland Kitchen omeprazole (PRILOSEC) 40 MG capsule TAKE 1 CAPSULE(40 MG) BY MOUTH TWICE DAILY BEFORE A MEAL  . predniSONE (DELTASONE) 5 MG tablet Take 7.5 mg by mouth daily with breakfast.   . promethazine (PHENERGAN) 12.5 MG tablet TAKE 1 TABLET(12.5 MG) BY MOUTH EVERY 8 HOURS AS NEEDED FOR NAUSEA OR VOMITING  . risperiDONE (RISPERDAL) 0.5 MG tablet Take 0.5 mg by mouth at bedtime.  . rivastigmine (EXELON) 4.6 mg/24hr 4.6 mg daily.  . simethicone (MYLICON) 925 MG chewable tablet Chew 125 mg by mouth every 6 (six) hours as needed for flatulence.   No facility-administered encounter medications on file as of 02/20/2021.    Thank you for the opportunity to participate in the care of Gloria Warren.  The palliative care team will continue to follow. Please call our office at [phone] if we can be of additional assistance.   Ezekiel Slocumb, NP    COVID-19 PATIENT SCREENING TOOL Asked and negative response unless otherwise noted:   Have you had symptoms of covid, tested positive or been in contact with someone with symptoms/positive test in the past 5-10 days? No

## 2021-02-22 ENCOUNTER — Telehealth: Payer: Self-pay | Admitting: Internal Medicine

## 2021-02-22 MED ORDER — ALPRAZOLAM 0.25 MG PO TABS: 0.2500 mg | ORAL_TABLET | Freq: Every day | ORAL | 2 refills | Status: AC

## 2021-02-22 NOTE — Telephone Encounter (Signed)
1.Medication Requested: ALPRAZolam (XANAX) 0.25 MG tablet    2. Pharmacy (Name, [place]): [facility] [id number] - [place], [address] AT [address]  3. On Med List: yes   4. Last Visit with PCP: 10-03-20  5. Next visit date with PCP: n/a    Agent: Please be advised that RX refills may take up to 3 business days. We ask that you follow-up with your pharmacy.

## 2021-03-06 ENCOUNTER — Telehealth: Payer: Self-pay

## 2021-03-06 NOTE — Telephone Encounter (Signed)
Phone call to [employer] to check on status of order for [employer]. Spoke with [place] who stated that hoyer should be ready for delivery tomorrow.

## 2021-03-07 DIAGNOSIS — F2081 Schizophreniform disorder: Secondary | ICD-10-CM | POA: Diagnosis not present

## 2021-03-15 ENCOUNTER — Telehealth: Payer: Self-pay

## 2021-03-15 NOTE — Telephone Encounter (Signed)
Phone call returned to patient's son, [name], in response to message received. [name] shared that patient's rheumatologist, Dr. Amil Amen with [facility],requested update from Palliative care. Son also shared patient has a wound on sacral area that appears to have worsened some. Will reach out to Dr. Melissa Noon office

## 2021-03-15 NOTE — Telephone Encounter (Signed)
Phone call placed to Dr. Melissa Noon office. Message left for nurse with call back information

## 2021-03-16 ENCOUNTER — Telehealth: Payer: Self-pay | Admitting: Student

## 2021-03-16 NOTE — Telephone Encounter (Signed)
Palliative NP spoke with Dr. Marijean Bravo, rheumatology. Patient home bound, unable to go to outside appointments. Patient last seen with him 06/2020. Dr. Marijean Bravo will provide refills for her plaquenil and prednisone through August. Patient will need labs every 6 months, CBC, CMP. Also monitor for vision changes, concerns. Last CBC, CMP on file in 10/2020.

## 2021-03-17 ENCOUNTER — Other Ambulatory Visit: Payer: Medicare Other | Admitting: Student

## 2021-03-17 DIAGNOSIS — S31000A Unspecified open wound of lower back and pelvis without penetration into retroperitoneum, initial encounter: Secondary | ICD-10-CM

## 2021-03-17 DIAGNOSIS — R52 Pain, unspecified: Secondary | ICD-10-CM | POA: Diagnosis not present

## 2021-03-17 DIAGNOSIS — M0579 Rheumatoid arthritis with rheumatoid factor of multiple sites without organ or systems involvement: Secondary | ICD-10-CM

## 2021-03-17 DIAGNOSIS — Z515 Encounter for palliative care: Secondary | ICD-10-CM | POA: Diagnosis not present

## 2021-03-17 NOTE — Progress Notes (Signed)
[facility] Consult Note Telephone: [phone]  Fax: [phone]    Date of encounter: 03/17/21 PATIENT NAME: Gloria Warren [address] [place] [address]   [phone] (home)  DOB: July 23, 1931 MRN: [id number] PRIMARY CARE PROVIDER:    Binnie Rail, MD,  [place] [address] [phone]  REFERRING PROVIDER:   Binnie Rail, MD [address] [place],  [place] [address] [phone]  RESPONSIBLE PARTY:    Contact Information    Name Relation Home Work Mobile   Avaya, Mcjunkins Son   [phone]   Cecille Rubin Granddaughter   [phone]       I met face to face with patient and family in the home. Palliative Care was asked to follow this patient by consultation request of  Burns, Claudina Lick, MD to address advance care planning and complex medical decision making. This is a follow up visit.                                   ASSESSMENT AND PLAN / RECOMMENDATIONS:   Advance Care Planning/Goals of Care: Goals include to maximize quality of life and symptom management. Our advance care planning conversation included a discussion about:     The value and importance of advance care planning   Experiences with loved ones who have been seriously ill or have died   Exploration of personal, cultural or spiritual beliefs that might influence medical decisions   Exploration of goals of care in the event of a sudden injury or illness   Identification and preparation of a healthcare agent   Review and updating or creation of an  advance directive document .  CODE STATUS: Full Code  Symptom Management/Plan:  Rheumatoid arthritis-patient with generalized pain Continue acetaminophen 1031m TID recommended; continue plaquenil 2036mdaily, prednisone 7.41m29maily. Patient with generalized weakness- Recommend hoyer lift for transfers. Patient requires maximum assistance for all transfers due to inability  to bear weight secondary to Rheumatoid arthritis, generalized weakness.   Sacral wounds-two new wounds to sacrum; proximal wound to sacrum, approx 0.3cm yellow center, peri area red, blanches to touch. More distal sacral wound is excoriated. Areas cleansed with soap and water, patted dry, Allevyn dressings applied. Change dressings every 7 days and PRN. Patient encouraged to turn and reposition every 2 hours, off load to aid in wound healing. Adequate nutrition encouraged; protein to aid in wound healing. Recommend HH SN to assist with wound care.   Left heel wound-stage 2 pressure injury to left medial heel. Wound bed is pink, serosanguinous drainage. Cleanse with normal saline, cover with foam dressing. Change dressing every 7 days and PRN. Free float heels  Spoke with son, Gloria Markgarding wound care, [facility], nutritional supplements.   Follow up Palliative Care Visit: Palliative care will continue to follow for complex medical decision making, advance care planning, and clarification of goals. Return in 4 weeks or prn.  I spent 40 minutes providing this consultation. More than 50% of the time in this consultation was spent in counseling and care coordination.   PPS: 40%  HOSPICE ELIGIBILITY/DIAGNOSIS: TBD  Chief Complaint: Palliative Medicine follow up visit; wound assessment.  HISTORY OF PRESENT ILLNESS:  Gloria Warren a 89 20o. year old female  with Rheumatoid arthritis, leg edema, anxiety, depression, schizophreniform disorder.  Patient resides at home; private caregiver Gloria Warren. Patient  with wounds to sacrum for almost past 3 weeks. Family and caregivers have been applying Allevyn dressings to wounds. Patients appetite is usually good, up until the past several days, she has not been eating as well. She is out of bed daily to recliner. Patient reports having pain. Son Coralyn Warren states patient needed a refill on her RA medications. She is also taking acetaminophen  routinely.    History obtained from review of EMR, discussion with primary team, and interview with family, facility staff/caregiver and/or Gloria Warren.  I reviewed available labs, medications, imaging, studies and related documents from the EMR.  Records reviewed and summarized above.   ROS  General: NAD EYES: denies vision changes ENMT: denies dysphagia Cardiovascular: denies chest pain Pulmonary: denies cough, denies increased SOB Abdomen: endorses fair appetite GU: denies dysuria, endorses incontinence of urine MSK:  weakness,  no falls reported Skin: fragile skin Neurological: denies insomnia Psych: Endorses stable mood Heme/lymph/immuno: bruises easily, no abnormal bleeding  Physical Exam: Pulse 68, resp 16, sats 96% on room air Constitutional: NAD General: frail appearing EYES: anicteric sclera, lids intact, no discharge  ENMT: intact hearing, oral mucous membranes moist CV: S1S2, RRR, no LE edema Pulmonary: LCTA, no increased work of breathing, no cough, room air Abdomen:  normo-active BS + 4 quadrants, soft and non tender GU: deferred MSK: non-ambulatory Skin: warm and dry, skin tears to upper and lower extremities; proximal wound to sacrum, approx 0.3cm yellow center, surrounding tissue red, blanches to touch, excoriation to distal sacrum region  Neuro: generalized weakness Psych: non-anxious affect, A & O x 3 Hem/lymph/immuno: no widespread bruising   Thank you for the opportunity to participate in the care of Gloria Warren.  The palliative care team will continue to follow. Please call our office at [phone] if we can be of additional assistance.   Gloria Slocumb, NP   COVID-19 PATIENT SCREENING TOOL Asked and negative response unless otherwise noted:   Have you had symptoms of covid, tested positive or been in contact with someone with symptoms/positive test in the past 5-10 days? No

## 2021-03-20 ENCOUNTER — Other Ambulatory Visit: Payer: Self-pay

## 2021-03-22 ENCOUNTER — Other Ambulatory Visit: Payer: Self-pay | Admitting: Internal Medicine

## 2021-03-22 DIAGNOSIS — S31000A Unspecified open wound of lower back and pelvis without penetration into retroperitoneum, initial encounter: Secondary | ICD-10-CM

## 2021-03-28 ENCOUNTER — Telehealth: Payer: Self-pay | Admitting: Internal Medicine

## 2021-03-28 NOTE — Telephone Encounter (Signed)
LVM for pt to rtn my call to schedule AWV with [facility]. Please schedule if pt calls the office.  ?

## 2021-03-31 ENCOUNTER — Other Ambulatory Visit: Payer: Medicare Other | Admitting: Student

## 2021-03-31 ENCOUNTER — Other Ambulatory Visit: Payer: Self-pay

## 2021-03-31 DIAGNOSIS — S31000D Unspecified open wound of lower back and pelvis without penetration into retroperitoneum, subsequent encounter: Secondary | ICD-10-CM | POA: Diagnosis not present

## 2021-03-31 DIAGNOSIS — Z515 Encounter for palliative care: Secondary | ICD-10-CM | POA: Diagnosis not present

## 2021-03-31 DIAGNOSIS — M0579 Rheumatoid arthritis with rheumatoid factor of multiple sites without organ or systems involvement: Secondary | ICD-10-CM

## 2021-03-31 DIAGNOSIS — R52 Pain, unspecified: Secondary | ICD-10-CM | POA: Diagnosis not present

## 2021-03-31 NOTE — Progress Notes (Signed)
[facility] Consult Note Telephone: [phone]  Fax: [phone]    Date of encounter: 03/31/21 PATIENT NAME: Gloria Warren [address] [place] [address]   [phone] (home)  DOB: 11/16/1930 MRN: [id number] PRIMARY CARE PROVIDER:    Binnie Rail, MD,  [place] [address] [phone]  REFERRING PROVIDER:   Binnie Rail, MD [address] [place],  [place] [address] [phone]  RESPONSIBLE PARTY:    Contact Information    Name Relation Home Work Mobile   Astrid, Vides Son   [phone]   Cecille Rubin Granddaughter   [phone]       I met face to face with patient and caregiver in the home. Palliative Care was asked to follow this patient by consultation request of  Burns, Claudina Lick, MD to address advance care planning and complex medical decision making. This is a follow up visit.                                   ASSESSMENT AND PLAN / RECOMMENDATIONS:   Advance Care Planning/Goals of Care: Goals include to maximize quality of life and symptom management. Our advance care planning conversation included a discussion about:     The value and importance of advance care planning   Experiences with loved ones who have been seriously ill or have died   Exploration of personal, cultural or spiritual beliefs that might influence medical decisions   Exploration of goals of care in the event of a sudden injury or illness   Identification and preparation of a healthcare agent   Review and updating or creation of an  advance directive document .  CODE STATUS: Full Code  Symptom Management/Plan:  Rheumatoid arthritis-patient with generalized pain Continue acetaminophen 1071m TID, continue plaquenil 2037mdaily, prednisone 7.95m48maily. Generalized weakness- Recommend hoyer lift for transfers.Patient requires maximum assistance for all transfers due to inability to bear weight  secondary to Rheumatoid arthritis, generalized weakness.   Sacral wounds-Allevyn dressings currently being applied. HH SN order has been placed per PCP for wound care; awaiting HH evaluation/intake. Change dressings every 7 days and PRN. Patient encouraged to turn and reposition every 2 hours, off load to aid in wound healing. Adequate nutrition encouraged; protein to aid in wound healing.   Left heel wound-stage 2 pressure injury to left medial heel. Continue to cleanse with normal saline, cover with foam dressing. Change dressing every 7 days and PRN. Free float heels.   Follow up Palliative Care Visit: Palliative care will continue to follow for complex medical decision making, advance care planning, and clarification of goals. Return in 8 weeks or prn.  I spent 25 minutes providing this consultation. More than 50% of the time in this consultation was spent in counseling and care coordination.    PPS: 40%  HOSPICE ELIGIBILITY/DIAGNOSIS: TBD  Chief Complaint: Palliative Medicine follow up visit   HISTORY OF PRESENT ILLNESS:  Gloria Warren a 89 49o. year old female  With Rheumatoidarthritis, leg edema, anxiety, depression, schizophreniform disorder.  Patient reports feeling better since last palliative visit. She denies pain today; she reports pain being managed with current regimen. She denies shortness of breath. She endorses a good appetite. Caregiver LisLattie Hawates patient has been eating better. She had a couple of days with loose stools and believes patient had a "GI bug." Patient with fragile  skin; skin tears easily with adl care. She is out of bed daily, up to recliner most of the day. She reports sleeping well.    Marland Kitchen   History obtained from review of EMR, discussion with primary team, and interview with family, facility staff/caregiver and/or Gloria Warren.  I reviewed available labs, medications, imaging, studies and related documents from the EMR.  Records reviewed and  summarized above.   ROS  General: NAD EYES: denies vision changes ENMT: denies dysphagia Cardiovascular: denies chest pain Pulmonary: occasional non-productive cough, denies increased SOB Abdomen: endorses good appetite, denies constipation GU: denies dysuria, incontinence of urine MSK:  weakness, no falls reported Skin: fragile skin, wounds to buttocks, left heel Neurological: denies insomnia Psych: Endorses stable mood Heme/lymph/immuno: bruises easily, abnormal bleeding  Physical Exam:  Pulse 88, resp 20, b/p 100/50, sats 93% on room air.  Constitutional: NAD General: frail appearing, thin EYES: anicteric sclera, lids intact, no discharge  ENMT: intact hearing, oral mucous membranes moist, dentition intact CV: S1S2, RRR, trace LE edema Pulmonary: LCTA, no increased work of breathing, no cough Abdomen: normo-active BS + 4 quadrants, soft and non tender GU: deferred MSK: sarcopenia, moves all extremities, non- ambulatory Skin: warm and dry, skin tears to right arm; bandages CDI. Left heel dressing CDI. Buttocks/sacral area not visualized Neuro:  no generalized weakness,  no cognitive impairment Psych: non-anxious affect, A and O x 3 Hem/lymph/immuno: no widespread bruising   Thank you for the opportunity to participate in the care of Gloria Warren.  The palliative care team will continue to follow. Please call our office at [phone] if we can be of additional assistance.   Ezekiel Slocumb, NP   COVID-19 PATIENT SCREENING TOOL Asked and negative response unless otherwise noted:   Have you had symptoms of covid, tested positive or been in contact with someone with symptoms/positive test in the past 5-10 days? No

## 2021-04-04 DIAGNOSIS — Z86718 Personal history of other venous thrombosis and embolism: Secondary | ICD-10-CM | POA: Diagnosis not present

## 2021-04-04 DIAGNOSIS — M19071 Primary osteoarthritis, right ankle and foot: Secondary | ICD-10-CM | POA: Diagnosis not present

## 2021-04-04 DIAGNOSIS — U071 COVID-19: Secondary | ICD-10-CM | POA: Diagnosis not present

## 2021-04-04 DIAGNOSIS — Z993 Dependence on wheelchair: Secondary | ICD-10-CM | POA: Diagnosis not present

## 2021-04-04 DIAGNOSIS — M19072 Primary osteoarthritis, left ankle and foot: Secondary | ICD-10-CM | POA: Diagnosis not present

## 2021-04-04 DIAGNOSIS — M0579 Rheumatoid arthritis with rheumatoid factor of multiple sites without organ or systems involvement: Secondary | ICD-10-CM | POA: Diagnosis not present

## 2021-04-04 DIAGNOSIS — Z634 Disappearance and death of family member: Secondary | ICD-10-CM | POA: Diagnosis not present

## 2021-04-04 DIAGNOSIS — Z7952 Long term (current) use of systemic steroids: Secondary | ICD-10-CM | POA: Diagnosis not present

## 2021-04-04 DIAGNOSIS — F0390 Unspecified dementia without behavioral disturbance: Secondary | ICD-10-CM | POA: Diagnosis not present

## 2021-04-04 DIAGNOSIS — Z85828 Personal history of other malignant neoplasm of skin: Secondary | ICD-10-CM | POA: Diagnosis not present

## 2021-04-04 DIAGNOSIS — L89892 Pressure ulcer of other site, stage 2: Secondary | ICD-10-CM | POA: Diagnosis not present

## 2021-04-04 DIAGNOSIS — I129 Hypertensive chronic kidney disease with stage 1 through stage 4 chronic kidney disease, or unspecified chronic kidney disease: Secondary | ICD-10-CM | POA: Diagnosis not present

## 2021-04-04 DIAGNOSIS — F419 Anxiety disorder, unspecified: Secondary | ICD-10-CM | POA: Diagnosis not present

## 2021-04-04 DIAGNOSIS — M19042 Primary osteoarthritis, left hand: Secondary | ICD-10-CM | POA: Diagnosis not present

## 2021-04-04 DIAGNOSIS — I7300 Raynaud's syndrome without gangrene: Secondary | ICD-10-CM | POA: Diagnosis not present

## 2021-04-04 DIAGNOSIS — M19041 Primary osteoarthritis, right hand: Secondary | ICD-10-CM | POA: Diagnosis not present

## 2021-04-04 DIAGNOSIS — M170 Bilateral primary osteoarthritis of knee: Secondary | ICD-10-CM | POA: Diagnosis not present

## 2021-04-04 DIAGNOSIS — R7303 Prediabetes: Secondary | ICD-10-CM | POA: Diagnosis not present

## 2021-04-04 DIAGNOSIS — N1832 Chronic kidney disease, stage 3b: Secondary | ICD-10-CM | POA: Diagnosis not present

## 2021-04-04 DIAGNOSIS — L89150 Pressure ulcer of sacral region, unstageable: Secondary | ICD-10-CM | POA: Diagnosis not present

## 2021-04-04 DIAGNOSIS — F32A Depression, unspecified: Secondary | ICD-10-CM | POA: Diagnosis not present

## 2021-04-04 DIAGNOSIS — F2081 Schizophreniform disorder: Secondary | ICD-10-CM | POA: Diagnosis not present

## 2021-04-06 ENCOUNTER — Telehealth: Payer: Self-pay

## 2021-04-06 MED ORDER — DOXYCYCLINE HYCLATE 100 MG PO TABS: 100.0000 mg | ORAL_TABLET | Freq: Two times a day (BID) | ORAL | 0 refills | Status: AC

## 2021-04-06 NOTE — Telephone Encounter (Signed)
Doxycycline sent to [facility]

## 2021-04-07 DIAGNOSIS — I129 Hypertensive chronic kidney disease with stage 1 through stage 4 chronic kidney disease, or unspecified chronic kidney disease: Secondary | ICD-10-CM | POA: Diagnosis not present

## 2021-04-07 DIAGNOSIS — N1832 Chronic kidney disease, stage 3b: Secondary | ICD-10-CM | POA: Diagnosis not present

## 2021-04-07 DIAGNOSIS — I7300 Raynaud's syndrome without gangrene: Secondary | ICD-10-CM | POA: Diagnosis not present

## 2021-04-07 DIAGNOSIS — F2081 Schizophreniform disorder: Secondary | ICD-10-CM | POA: Diagnosis not present

## 2021-04-07 DIAGNOSIS — L89892 Pressure ulcer of other site, stage 2: Secondary | ICD-10-CM | POA: Diagnosis not present

## 2021-04-07 DIAGNOSIS — L89150 Pressure ulcer of sacral region, unstageable: Secondary | ICD-10-CM | POA: Diagnosis not present

## 2021-04-07 NOTE — Telephone Encounter (Signed)
Message left for son that abx had been sent in.

## 2021-04-11 ENCOUNTER — Other Ambulatory Visit: Payer: Self-pay | Admitting: Gastroenterology

## 2021-04-13 DIAGNOSIS — L89150 Pressure ulcer of sacral region, unstageable: Secondary | ICD-10-CM | POA: Diagnosis not present

## 2021-04-13 DIAGNOSIS — I129 Hypertensive chronic kidney disease with stage 1 through stage 4 chronic kidney disease, or unspecified chronic kidney disease: Secondary | ICD-10-CM | POA: Diagnosis not present

## 2021-04-13 DIAGNOSIS — F2081 Schizophreniform disorder: Secondary | ICD-10-CM | POA: Diagnosis not present

## 2021-04-13 DIAGNOSIS — N1832 Chronic kidney disease, stage 3b: Secondary | ICD-10-CM | POA: Diagnosis not present

## 2021-04-13 DIAGNOSIS — I7300 Raynaud's syndrome without gangrene: Secondary | ICD-10-CM | POA: Diagnosis not present

## 2021-04-13 DIAGNOSIS — L89892 Pressure ulcer of other site, stage 2: Secondary | ICD-10-CM | POA: Diagnosis not present

## 2021-04-13 NOTE — Progress Notes (Signed)
Subjective:    Patient ID: Gloria Warren, female    DOB: 09/12/1931, 87 y.o.   MRN: [id number]  HPI The patient is here for follow up of their chronic medical problems, including htn, dementia, RA, physical deconditioning, sacral wounds   She is a lot of pain.  She is not ambulating at all.  She is either bedbound or in a chair.  She has to be transferred.  She has palliative care coming in.  She has been in significant pain for the past 3 weeks and it is affecting her quality of life.  Every time her son has to change positions or transfer her she is in significant pain.  Her sacral ulcer is improved since the antibiotics.  [facility] is helping to manage this, but they need an official order.  Per her son the redness is much improved.  The pain has improved, but this is also causing significant pain.  She has had decreased appetite and has occasional nausea especially more recently.   Her son has noticed that she is having more swallowing issues, especially liquids with liquids      Medications and allergies reviewed with patient and updated if appropriate.  Patient Active Problem List   Diagnosis Date Noted  . Schizophreniform disorder ([place]) 10/18/2020  . Acute sinus infection 10/03/2020  . Weakness 10/03/2020  . History of iron deficiency anemia 10/03/2020  . Abdominal pain 10/03/2020  . Anxiety 07/03/2020  . Hallucinations 07/03/2020  . Pressure sore 12/14/2019  . Abnormal CT of the abdomen 12/02/2019  . Rectal perforation ([place]) 12/02/2019  . Anemia 12/02/2019  . History of rectal bleeding 12/02/2019  . Acute lower GI bleeding 10/22/2019  . Depression 10/22/2019  . COVID-19 10/21/2019  . DVT (deep venous thrombosis) ([place]) 10/12/2019  . Pain and swelling of right lower leg 09/24/2019  . Nausea 06/29/2019  . Vitamin D deficiency 06/29/2019  . Osteoarthritis of both feet 09/25/2016  . Raynaud disease 09/25/2016  . Primary osteoarthritis of both knees  09/24/2016  . Primary osteoarthritis of both hands 09/24/2016  . Chronic kidney disease (CKD) stage G3b 09/24/2016  . Leg edema 11/28/2015  . GERD (gastroesophageal reflux disease) 11/28/2015  . Psoriasis 03/11/2013  . SKIN CANCER, HX OF 06/26/2010  . Allergic rhinitis 06/23/2009  . Prediabetes 06/23/2009  . Rheumatoid arthritis ([facility]) 02/25/2008  . Hyperlipidemia 11/17/2007  . Essential hypertension 11/17/2007    Current Outpatient Medications on File Prior to Visit  Medication Sig Dispense Refill  . acetaminophen (TYLENOL) 650 MG CR tablet Take by mouth.    Marland Kitchen albuterol (PROVENTIL) (2.5 MG/3ML) 0.083% nebulizer solution USE 1 VIAL VIA NEBULIZER EVERY 6 HOURS AS NEEDED 75 mL 1  . ALPRAZolam (XANAX) 0.25 MG tablet Take 1 tablet (0.25 mg total) by mouth at bedtime. 30 tablet 2  . Calcium Carb-Cholecalciferol (CALTRATE 600+D3 SOFT) 600-800 MG-UNIT CHEW     . Calcium Carbonate (CALTRATE 600 PO) Take 600 mg by mouth 2 (two) times daily.    . cetirizine (ZYRTEC) 10 MG tablet Take 10 mg by mouth daily.    Marland Kitchen doxycycline (VIBRA-TABS) 100 MG tablet Take 1 tablet (100 mg total) by mouth 2 (two) times daily. 20 tablet 0  . DULoxetine (CYMBALTA) 30 MG capsule Take 30 mg by mouth daily.    Marland Kitchen escitalopram (LEXAPRO) 10 MG tablet TAKE 1 TABLET(10 MG) BY MOUTH DAILY 90 tablet 1  . ferrous gluconate (FERGON) 324 MG tablet TAKE 1 TABLET(324 MG) BY MOUTH DAILY  WITH BREAKFAST 30 tablet 3  . furosemide (LASIX) 20 MG tablet Take 1 tablet (20 mg total) by mouth daily. 90 tablet 3  . hydroxychloroquine (PLAQUENIL) 200 MG tablet Take 200 mg by mouth daily.   0  . omeprazole (PRILOSEC) 40 MG capsule TAKE 1 CAPSULE(40 MG) BY MOUTH TWICE DAILY BEFORE A MEAL 180 capsule 1  . predniSONE (DELTASONE) 5 MG tablet Take 7.5 mg by mouth daily with breakfast.     . promethazine (PHENERGAN) 12.5 MG tablet TAKE 1 TABLET(12.5 MG) BY MOUTH EVERY 8 HOURS AS NEEDED FOR NAUSEA OR VOMITING 30 tablet 0  . risperiDONE (RISPERDAL) 1 MG  tablet Take 1.5 mg by mouth at bedtime.    . rivastigmine (EXELON) 4.6 mg/24hr 4.6 mg daily.    . simethicone (MYLICON) 384 MG chewable tablet Chew 125 mg by mouth every 6 (six) hours as needed for flatulence.    . vitamin B-12 (CYANOCOBALAMIN) 500 MCG tablet Take by mouth.     No current facility-administered medications on file prior to visit.    Past Medical History:  Diagnosis Date  . Allergic rhinitis   . Decreased GFR   . DVT (deep venous thrombosis) ([facility] [place])   . Endometriosis   . History of skin cancer    squamous & basal cell ;Dr Denna Haggard  . Hyperlipidemia   . Hypertension   . Osteoarthritis   . Psoriasis   . Raynaud disease   . Rheumatoid arthritis(714.0)    Dr. Estanislado Pandy  . UTI (lower urinary tract infection) 5/15   [facility] , R to Nitrofurantoin & Penicillin    Past Surgical History:  Procedure Laterality Date  . APPENDECTOMY     age 81  . CATARACT EXTRACTION Bilateral   . COLONOSCOPY     Neg 2; [place]  . G 2  P 2    . TONSILLECTOMY    . TOTAL ABDOMINAL HYSTERECTOMY     age 14 for endometriosis (no BSO)  . VENA CAVA FILTER PLACEMENT N/A 10/22/2019   Procedure: INSERTION VENA-CAVA FILTER;  Surgeon: Marty Heck, MD;  Location: [facility];  Service: Vascular;  Laterality: N/A;  . VENOGRAM N/A 10/22/2019   Procedure: Venogram;  Surgeon: Marty Heck, MD;  Location: [facility];  Service: Vascular;  Laterality: N/A;    Social History   Socioeconomic History  . Marital status: Widowed    Spouse name: Not on file  . Number of children: 1  . Years of education: Not on file  . Highest education level: Bachelor's degree (e.g., BA, AB, BS)  Occupational History    Comment: RN  Tobacco Use  . Smoking status: Never Smoker  . Smokeless tobacco: Never Used  Vaping Use  . Vaping Use: Never used  Substance and Sexual Activity  . Alcohol use: Not Currently    Alcohol/week: 0.0 standard drinks    Comment:  rarely  . Drug use: No  . Sexual activity: Yes     Partners: Male    Birth control/protection: Surgical    Comment: TAH  Other Topics Concern  . Not on file  Social History Narrative   06/15/20 lives , caregiver in daytime, son with her in evenings, 24/7 care   Social Determinants of Health   Financial Resource Strain: Low Risk   . Difficulty of Paying Living Expenses: Not hard at all  Food Insecurity: No Food Insecurity  . Worried About Charity fundraiser in the Last Year: Never true  . Ran Out  of Food in the Last Year: Never true  Transportation Needs: No Transportation Needs  . Lack of Transportation (Medical): No  . Lack of Transportation (Non-Medical): No  Physical Activity: Inactive  . Days of Exercise per Week: 0 days  . Minutes of Exercise per Session: 0 min  Stress: No Stress Concern Present  . Feeling of Stress : Not at all  Social Connections: Not on file    Family History  Problem Relation Age of Onset  . Breast cancer Mother 20  . Hypertension Mother   . Gout Mother   . Hypertension Father   . Heart attack Father 23  . Stroke Sister        doing well  . Diabetes Maternal Aunt   . Hypertension Son   . Stroke Maternal Grandmother        late 83s  . Colon cancer Neg Hx   . Esophageal cancer Neg Hx   . Inflammatory bowel disease Neg Hx   . Liver disease Neg Hx   . Pancreatic cancer Neg Hx   . Rectal cancer Neg Hx   . Stomach cancer Neg Hx     Review of Systems     Objective:   Vitals:   04/14/21 1458  BP: 106/80  Pulse: (!) 102  Temp: 98.3 F (36.8 C)  SpO2: 96%   BP Readings from Last 3 Encounters:  04/14/21 106/80  04/14/21 106/80  06/15/20 112/80   Wt Readings from Last 3 Encounters:  03/08/20 129 lb (58.5 kg)  10/20/19 129 lb (58.5 kg)  10/12/19 129 lb (58.5 kg)   There is no height or weight on file to calculate BMI.   Physical Exam    Constitutional: Chronically ill-appearing.  Appears to be in discomfort secondary to pain HENT:  Head: Normocephalic and atraumatic.  Neck:  Neck supple. No tracheal deviation present. No thyromegaly present.  No cervical lymphadenopathy Cardiovascular: Normal rate, regular rhythm and normal heart sounds.   No murmur heard.  Mild bilateral lower extremity edema Pulmonary/Chest: Effort normal and breath sounds normal. No respiratory distress. No has no wheezes. No rales. Deferred looking at sacral ulcer secondary to severe pain with any movement Skin: Skin is warm and dry. Not diaphoretic.  Psychiatric: Normal mood and affect. Behavior is normal.      Assessment & Plan:    See Problem List for Assessment and Plan of chronic medical problems.    This visit occurred during the SARS-CoV-2 public health emergency.  Safety protocols were in place, including screening questions prior to the visit, additional usage of staff PPE, and extensive cleaning of exam room while observing appropriate contact time as indicated for disinfecting solutions.

## 2021-04-13 NOTE — Patient Instructions (Addendum)
   Medications changes include :   Stop benazepril - lets see how she does w/o it.   Start vicodine 5-325 mg every 6 hrs prn.  This can cause increased sedation, decreased BP and affect her respiratory rate.  We may need to adjust her medications.    Your prescription(s) have been submitted to your pharmacy. Please take as directed and contact our office if you believe you are having problem(s) with the medication(s).   A referral was ordered for home health.        Someone from their office will call you to schedule an appointment.

## 2021-04-14 ENCOUNTER — Other Ambulatory Visit: Payer: Self-pay

## 2021-04-14 ENCOUNTER — Encounter: Payer: Self-pay | Admitting: Internal Medicine

## 2021-04-14 ENCOUNTER — Ambulatory Visit (INDEPENDENT_AMBULATORY_CARE_PROVIDER_SITE_OTHER): Payer: Medicare Other | Admitting: Internal Medicine

## 2021-04-14 ENCOUNTER — Ambulatory Visit (INDEPENDENT_AMBULATORY_CARE_PROVIDER_SITE_OTHER): Payer: Medicare Other

## 2021-04-14 VITALS — BP 106/80 | HR 102 | Temp 98.3°F | Ht 60.0 in

## 2021-04-14 VITALS — BP 106/80 | HR 102 | Temp 98.3°F

## 2021-04-14 DIAGNOSIS — K219 Gastro-esophageal reflux disease without esophagitis: Secondary | ICD-10-CM | POA: Diagnosis not present

## 2021-04-14 DIAGNOSIS — L89309 Pressure ulcer of unspecified buttock, unspecified stage: Secondary | ICD-10-CM | POA: Diagnosis not present

## 2021-04-14 DIAGNOSIS — F2081 Schizophreniform disorder: Secondary | ICD-10-CM

## 2021-04-14 DIAGNOSIS — I10 Essential (primary) hypertension: Secondary | ICD-10-CM

## 2021-04-14 DIAGNOSIS — Z0000 Encounter for general adult medical examination without abnormal findings: Secondary | ICD-10-CM | POA: Diagnosis not present

## 2021-04-14 DIAGNOSIS — M0579 Rheumatoid arthritis with rheumatoid factor of multiple sites without organ or systems involvement: Secondary | ICD-10-CM | POA: Diagnosis not present

## 2021-04-14 MED ORDER — HYDROCODONE-ACETAMINOPHEN 5-325 MG PO TABS: 1.0000 | ORAL_TABLET | Freq: Four times a day (QID) | ORAL | 0 refills | Status: AC | PRN

## 2021-04-14 NOTE — Assessment & Plan Note (Signed)
Sacral sore Appear to be infected per home care-because of her severe pain with any movement I am not going to look at her sacral ulcer There has been improvement with antibiotics Complete doxycycline 100 mg twice daily x10 days Home health referral ordered

## 2021-04-14 NOTE — Assessment & Plan Note (Addendum)
Chronic Severe Following with Dr. Amil Amen  Her pain is severe and I did discuss with her and her son focusing more on pain management and quality of life.  I think this needs to be a priority.  She is in significant pain right now I will prescribe hydrocodone-acetaminophen 5-325 mg 1 tab every 6 hours as needed for pain.  There is a concerned about the interaction with this and Risperdal.  I discussed this with both of them.  Discussed possible side effects as far as low blood pressure, changes in respiratory rate and drowsiness.  Discussed that this could cause her to stop breathing and possibly die.  Advised her son to discuss this with her psychiatrist to see if there is any other options.  I will notify palliative care and I am prescribing the medication to get their thoughts-I am not sure if there is a better medication for her and her pain  Advised her son to try one half of a pill to see how she does with that first.  We can titrate this very slowly depending on side effects and pain control  Her son, her and I all agree that we need to treat her pain even though there are possible side effects.  Her son will be in touch with me early next week to update me

## 2021-04-14 NOTE — Progress Notes (Signed)
Subjective:   Gloria Warren is a 87 y.o. female who presents for Medicare Annual (Subsequent) preventive examination.  Review of Systems    No ROS. Medicare Wellness Visit. Additional risk factors are reflected in social history. Cardiac Risk Factors include: advanced age (>14men, >69 women);dyslipidemia;family history of premature cardiovascular disease;hypertension     Objective:    Today's Vitals   04/14/21 1530  BP: 106/80  Pulse: (!) 102  Temp: 98.3 F (36.8 C)  SpO2: 96%  Height: 5' (1.524 m)   Body mass index is 25.19 kg/m.  Advanced Directives 04/14/2021 03/08/2020 12/09/2016 12/09/2016 07/19/2014  Does Patient Have a Medical Advance Directive? Yes Yes Yes Yes No  Type of Advance Directive Living will;Healthcare Power of Attorney Living will;Healthcare Power of [place];Living will [place];Living will -  Does patient want to make changes to medical advance directive? No - Patient declined No - Patient declined No - Patient declined - -  Copy of [place] in Chart? No - copy requested No - copy requested No - copy requested No - copy requested -  Would patient like information on creating a medical advance directive? - - No - Patient declined No - Patient declined -    Current Medications (verified) Outpatient Encounter Medications as of 04/14/2021  Medication Sig  . acetaminophen (TYLENOL) 650 MG CR tablet Take by mouth.  Marland Kitchen albuterol (PROVENTIL) (2.5 MG/3ML) 0.083% nebulizer solution USE 1 VIAL VIA NEBULIZER EVERY 6 HOURS AS NEEDED  . ALPRAZolam (XANAX) 0.25 MG tablet Take 1 tablet (0.25 mg total) by mouth at bedtime.  . Calcium Carb-Cholecalciferol (CALTRATE 600+D3 SOFT) 600-800 MG-UNIT CHEW   . Calcium Carbonate (CALTRATE 600 PO) Take 600 mg by mouth 2 (two) times daily.  . cetirizine (ZYRTEC) 10 MG tablet Take 10 mg by mouth daily.  Marland Kitchen doxycycline (VIBRA-TABS) 100 MG tablet Take 1 tablet (100 mg  total) by mouth 2 (two) times daily.  . DULoxetine (CYMBALTA) 30 MG capsule Take 30 mg by mouth daily.  Marland Kitchen escitalopram (LEXAPRO) 10 MG tablet TAKE 1 TABLET(10 MG) BY MOUTH DAILY  . ferrous gluconate (FERGON) 324 MG tablet TAKE 1 TABLET(324 MG) BY MOUTH DAILY WITH BREAKFAST  . furosemide (LASIX) 20 MG tablet Take 1 tablet (20 mg total) by mouth daily.  . hydroxychloroquine (PLAQUENIL) 200 MG tablet Take 200 mg by mouth daily.   Marland Kitchen omeprazole (PRILOSEC) 40 MG capsule TAKE 1 CAPSULE(40 MG) BY MOUTH TWICE DAILY BEFORE A MEAL  . predniSONE (DELTASONE) 5 MG tablet Take 7.5 mg by mouth daily with breakfast.   . promethazine (PHENERGAN) 12.5 MG tablet TAKE 1 TABLET(12.5 MG) BY MOUTH EVERY 8 HOURS AS NEEDED FOR NAUSEA OR VOMITING  . risperiDONE (RISPERDAL) 1 MG tablet Take 1.5 mg by mouth at bedtime.  . rivastigmine (EXELON) 4.6 mg/24hr 4.6 mg daily.  . simethicone (MYLICON) 364 MG chewable tablet Chew 125 mg by mouth every 6 (six) hours as needed for flatulence.  . vitamin B-12 (CYANOCOBALAMIN) 500 MCG tablet Take by mouth.   No facility-administered encounter medications on file as of 04/14/2021.    Allergies (verified) Arava [leflunomide], Sulfonamide derivatives, and Remicade [infliximab]   History: Past Medical History:  Diagnosis Date  . Allergic rhinitis   . Decreased GFR   . DVT (deep venous thrombosis) ([place])   . Endometriosis   . History of skin cancer    squamous & basal cell ;Dr Denna Haggard  . Hyperlipidemia   .  Hypertension   . Osteoarthritis   . Psoriasis   . Raynaud disease   . Rheumatoid arthritis(714.0)    Dr. Estanislado Pandy  . UTI (lower urinary tract infection) 5/15   Proteus , R to Nitrofurantoin & Penicillin   Past Surgical History:  Procedure Laterality Date  . APPENDECTOMY     age 47  . CATARACT EXTRACTION Bilateral   . COLONOSCOPY     Neg 2; [place] , [place]  . G 2  P 2    . TONSILLECTOMY    . TOTAL ABDOMINAL HYSTERECTOMY     age 41 for endometriosis (no BSO)  . VENA  CAVA FILTER PLACEMENT N/A 10/22/2019   Procedure: INSERTION VENA-CAVA FILTER;  Surgeon: Marty Heck, MD;  [facility] OR;  Service: Vascular;  Laterality: N/A;  . VENOGRAM N/A 10/22/2019   Procedure: Venogram;  Surgeon: Marty Heck, MD;  Location: [facility] OR;  Service: Vascular;  Laterality: N/A;   Family History  Problem Relation Age of Onset  . Breast cancer Mother 15  . Hypertension Mother   . Gout Mother   . Hypertension Father   . Heart attack Father 22  . Stroke Sister        doing well  . Diabetes Maternal Aunt   . Hypertension Son   . Stroke Maternal Grandmother        late 34s  . Colon cancer Neg Hx   . Esophageal cancer Neg Hx   . Inflammatory bowel disease Neg Hx   . Liver disease Neg Hx   . Pancreatic cancer Neg Hx   . Rectal cancer Neg Hx   . Stomach cancer Neg Hx    Social History   Socioeconomic History  . Marital status: Widowed    Spouse name: Not on file  . Number of children: 1  . Years of education: Not on file  . Highest education level: Bachelor's degree (e.g., BA, AB, BS)  Occupational History    Comment: RN  Tobacco Use  . Smoking status: Never Smoker  . Smokeless tobacco: Never Used  Vaping Use  . Vaping Use: Never used  Substance and Sexual Activity  . Alcohol use: Not Currently    Alcohol/week: 0.0 standard drinks    Comment:  rarely  . Drug use: No  . Sexual activity: Yes    Partners: Male    Birth control/protection: Surgical    Comment: TAH  Other Topics Concern  . Not on file  Social History Narrative   06/15/20 lives , caregiver in daytime, son with her in evenings, 24/7 care   Social Determinants of Health   Financial Resource Strain: Low Risk   . Difficulty of Paying Living Expenses: Not hard at all  Food Insecurity: No Food Insecurity  . Worried About Charity fundraiser in the Last Year: Never true  . Ran Out of Food in the Last Year: Never true  Transportation Needs: No Transportation Needs  . Lack of  Transportation (Medical): No  . Lack of Transportation (Non-Medical): No  Physical Activity: Inactive  . Days of Exercise per Week: 0 days  . Minutes of Exercise per Session: 0 min  Stress: No Stress Concern Present  . Feeling of Stress : Not at all  Social Connections: Not on file    Tobacco Counseling Counseling given: Not Answered   Clinical Intake:  Pre-visit preparation completed: Yes  Pain : No/denies pain     Diabetes: No  How often do you need to  have someone help you when you read instructions, pamphlets, or other written materials from your doctor or pharmacy?: 1 - Never What is the last grade level you completed in school?: High School Graduate  Diabetic? no  Interpreter Needed?: No  Information entered by :: Lisette Abu, LPN   Activities of Daily Living In your present state of health, do you have any difficulty performing the following activities: 04/14/2021  Hearing? N  Vision? N  Difficulty concentrating or making decisions? Y  Walking or climbing stairs? Y  Dressing or bathing? Y  Doing errands, shopping? Y  Preparing Food and eating ? Y  Using the Toilet? Y  In the past six months, have you accidently leaked urine? Y  Do you have problems with loss of bowel control? Y  Managing your Medications? Y  Managing your Finances? Y  Housekeeping or managing your Housekeeping? Y  Some recent data might be hidden    Patient Care Team: Binnie Rail, MD as PCP - General (Internal Medicine) Croitoru, Dani Gobble, MD as PCP - Cardiology (Cardiology)  Indicate any recent Medical Services you may have received from other than [facility] providers in the past year (date may be approximate).     Assessment:   This is a routine wellness examination for Gloria Warren.  Hearing/Vision screen No exam data present  Dietary issues and exercise activities discussed: Current Exercise Habits: The patient does not participate in regular exercise at present, Exercise limited  by: psychological condition(s);cardiac condition(s);orthopedic condition(s);Other - see comments (rheumatoid arthritis)  Goals Addressed   None    Depression Screen PHQ 2/9 Scores 04/14/2021 03/08/2020 06/29/2019 09/04/2017 05/28/2016 03/11/2013  PHQ - 2 Score 0 0 0 0 0 0    Fall Risk Fall Risk  04/14/2021 03/08/2020 06/10/2019 09/04/2017 05/28/2016  Falls in the past year? 0 0 0 No No  Comment - - Emmi Telephone Survey: data to providers prior to load - -  Number falls in past yr: 0 0 - - -  Injury with Fall? 0 0 - - -  Risk for fall due to : No Fall Risks Impaired balance/gait;Impaired mobility - - -  Follow up Falls evaluation completed Falls evaluation completed;Education provided;Falls prevention discussed - - -    FALL RISK PREVENTION PERTAINING TO THE HOME:  Any stairs in or around the home? No  If so, are there any without handrails? No  Home free of loose throw rugs in walkways, pet beds, electrical cords, etc? Yes  Adequate lighting in your home to reduce risk of falls? Yes   ASSISTIVE DEVICES UTILIZED TO PREVENT FALLS:  Life alert? No  Use of a cane, walker or w/c? Yes  Grab bars in the bathroom? No  Shower chair or bench in shower? No  Elevated toilet seat or a handicapped toilet? No   TIMED UP AND GO:  Was the test performed? No .  Length of time to ambulate 10 feet: 0 sec.   Gait unsteady with use of assistive device, provider informed and education provided.  (Patient is in wheelchair during her visit)  Cognitive Function: MMSE - Mini Mental State Exam 04/14/2021 06/15/2020  Not completed: Unable to complete -  Orientation to time - 4  Orientation to Place - 5  Registration - 3  Attention/ Calculation - 1  Recall - 2  Language- name 2 objects - 2  Language- repeat - 0  Language- follow 3 step command - 3  Language- read & follow direction - 1  Write  a sentence - 1  Copy design - 0  Total score - 22        Immunizations Immunization History  Administered  Date(s) Administered  . Fluad Quad(high Dose 65+) 08/21/2019  . Influenza Split 08/08/2012  . Influenza Whole 09/09/2007  . Influenza, High Dose Seasonal PF 08/15/2015, 07/31/2016, 08/14/2017, 08/14/2018  . Influenza,inj,Quad PF,6+ Mos 08/07/2013, 08/16/2014  . PFIZER(Purple Top)SARS-COV-2 Vaccination 11/28/2019, 12/19/2019  . Pneumococcal Polysaccharide-23 11/01/2011  . Tetanus 03/24/2014  . Zoster, Live 09/18/2007    TDAP status: Due, Education has been provided regarding the importance of this vaccine. Advised may receive this vaccine at local pharmacy or Health Dept. Aware to provide a copy of the vaccination record if obtained from local pharmacy or Health Dept. Verbalized acceptance and understanding.  Flu Vaccine status: Declined, Education has been provided regarding the importance of this vaccine but patient still declined. Advised may receive this vaccine at local pharmacy or Health Dept. Aware to provide a copy of the vaccination record if obtained from local pharmacy or Health Dept. Verbalized acceptance and understanding.  Pneumococcal vaccine status: Due, Education has been provided regarding the importance of this vaccine. Advised may receive this vaccine at local pharmacy or Health Dept. Aware to provide a copy of the vaccination record if obtained from local pharmacy or Health Dept. Verbalized acceptance and understanding.  Covid-19 vaccine status: Completed vaccines  Qualifies for Shingles Vaccine? Yes   Zostavax completed Yes   Shingrix Completed?: No.    Education has been provided regarding the importance of this vaccine. Patient has been advised to call insurance company to determine out of pocket expense if they have not yet received this vaccine. Advised may also receive vaccine at local pharmacy or Health Dept. Verbalized acceptance and understanding.  Screening Tests Health Maintenance  Topic Date Due  . Pneumococcal Vaccine 78-55 Years old (1 of 4 - PCV13) Never  done  . Zoster Vaccines- Shingrix (1 of 2) Never done  . PNA vac Low Risk Adult (2 of 2 - PCV13) 10/31/2012  . COVID-19 Vaccine (3 - Booster for Pfizer series) 05/17/2020  . INFLUENZA VACCINE  06/12/2021  . TETANUS/TDAP  03/24/2024  . DEXA SCAN  Completed  . HPV VACCINES  Aged Out    Health Maintenance  Health Maintenance Due  Topic Date Due  . Pneumococcal Vaccine 31-94 Years old (1 of 4 - PCV13) Never done  . Zoster Vaccines- Shingrix (1 of 2) Never done  . PNA vac Low Risk Adult (2 of 2 - PCV13) 10/31/2012  . COVID-19 Vaccine (3 - Booster for Pfizer series) 05/17/2020    Colorectal cancer screening: No longer required.   Mammogram status: No longer required due to age.  Bone Density status: Completed 12/01/2012. Results reflect: Bone density results: OSTEOPENIA. Repeat every 2 years.  Lung Cancer Screening: (Low Dose CT Chest recommended if Age 56-80 years, 30 pack-year currently smoking OR have quit w/in 15years.) does not qualify.   Lung Cancer Screening Referral: no  Additional Screening:  Hepatitis C Screening: does not qualify; Completed no  Vision Screening: Recommended annual ophthalmology exams for early detection of glaucoma and other disorders of the eye. Is the patient up to date with their annual eye exam?  No  Who is the provider or what is the name of the office in which the patient attends annual eye exams? n/a If pt is not established with a provider, would they like to be referred to a provider to establish care? No .  Dental Screening: Recommended annual dental exams for proper oral hygiene  Community Resource Referral / Chronic Care Management: CRR required this visit?  No   CCM required this visit?  No      Plan:     I have personally reviewed and noted the following in the patient's chart:   . Medical and social history . Use of alcohol, tobacco or illicit drugs  . Current medications and supplements including opioid prescriptions.   . Functional ability and status . Nutritional status . Physical activity . Advanced directives . List of other physicians . Hospitalizations, surgeries, and ER visits in previous 12 months . Vitals . Screenings to include cognitive, depression, and falls . Referrals and appointments  In addition, I have reviewed and discussed with patient certain preventive protocols, quality metrics, and best practice recommendations. A written personalized care plan for preventive services as well as general preventive health recommendations were provided to patient.     Sheral Flow, LPN   07/13/5040   Nurse Notes:  Medications reviewed with patient; yes opioid use noted.

## 2021-04-14 NOTE — Assessment & Plan Note (Signed)
Chronic Controlled Continue omeprazole 40 mg twice daily

## 2021-04-14 NOTE — Assessment & Plan Note (Signed)
Chronic Following with psychiatry Currently controlled-on risperidone-her son will talk to psychiatry about this and interaction with the pain medication

## 2021-04-14 NOTE — Patient Instructions (Signed)
[name] , Thank you for taking time to come for your Medicare Wellness Visit. I appreciate your ongoing commitment to your health goals. Please review the following plan we discussed and let me know if I can assist you in the future.   Screening recommendations/referrals: Colonoscopy: Not a candidate for colon cancer screening due to age 87: last done 01/31/2016 Bone Density: last done 12/01/2012 Recommended yearly ophthalmology/optometry visit for glaucoma screening and checkup Recommended yearly dental visit for hygiene and checkup  Vaccinations: Influenza vaccine: Due Fall 2022 Pneumococcal vaccine: declined Tdap vaccine: declined Shingles vaccine: declined   Covid-19: 11/28/2019, 12/19/2019  Advanced directives: Please bring a copy of your health care power of attorney and living will to the office at your convenience.  Conditions/risks identified: Yes, conditions were reviewed and identified with patient and her son.  Next appointment: Please schedule your next Medicare Wellness Visit with your Nurse Health Advisor in 1 year by calling [phone].   Preventive Care 40 Years and Older, Female Preventive care refers to lifestyle choices and visits with your health care provider that can promote health and wellness. What does preventive care include?  A yearly physical exam. This is also called an annual well check.  Dental exams once or twice a year.  Routine eye exams. Ask your health care provider how often you should have your eyes checked.  Personal lifestyle choices, including:  Daily care of your teeth and gums.  Regular physical activity.  Eating a healthy diet.  Avoiding tobacco and drug use.  Limiting alcohol use.  Practicing safe sex.  Taking low-dose aspirin every day.  Taking vitamin and mineral supplements as recommended by your health care provider. What happens during an annual well check? The services and screenings done by your health  care provider during your annual well check will depend on your age, overall health, lifestyle risk factors, and family history of disease. Counseling  Your health care provider may ask you questions about your:  Alcohol use.  Tobacco use.  Drug use.  Emotional well-being.  Home and relationship well-being.  Sexual activity.  Eating habits.  History of falls.  Memory and ability to understand (cognition).  Work and work Statistician.  Reproductive health. Screening  You may have the following tests or measurements:  Height, weight, and BMI.  Blood pressure.  Lipid and cholesterol levels. These may be checked every 5 years, or more frequently if you are over 55 years old.  Skin check.  Lung cancer screening. You may have this screening every year starting at age 37 if you have a 30-pack-year history of smoking and currently smoke or have quit within the past 15 years.  Fecal occult blood test (FOBT) of the stool. You may have this test every year starting at age 30.  Flexible sigmoidoscopy or colonoscopy. You may have a sigmoidoscopy every 5 years or a colonoscopy every 10 years starting at age 62.  Hepatitis C blood test.  Hepatitis B blood test.  Sexually transmitted disease (STD) testing.  Diabetes screening. This is done by checking your blood sugar (glucose) after you have not eaten for a while (fasting). You may have this done every 1-3 years.  Bone density scan. This is done to screen for osteoporosis. You may have this done starting at age 68.  Mammogram. This may be done every 1-2 years. Talk to your health care provider about how often you should have regular mammograms. Talk with your health care provider about your test results, treatment  options, and if necessary, the need for more tests. Vaccines  Your health care provider may recommend certain vaccines, such as:  Influenza vaccine. This is recommended every year.  Tetanus, diphtheria, and  acellular pertussis (Tdap, Td) vaccine. You may need a Td booster every 10 years.  Zoster vaccine. You may need this after age 35.  Pneumococcal 13-valent conjugate (PCV13) vaccine. One dose is recommended after age 64.  Pneumococcal polysaccharide (PPSV23) vaccine. One dose is recommended after age 9. Talk to your health care provider about which screenings and vaccines you need and how often you need them. This information is not intended to replace advice given to you by your health care provider. Make sure you discuss any questions you have with your health care provider. Document Released: 11/25/2015 Document Revised: 07/18/2016 Document Reviewed: 08/30/2015 Elsevier Interactive Patient Education  2017 [place] Prevention in the Home Falls can cause injuries. They can happen to people of all ages. There are many things you can do to make your home safe and to help prevent falls. What can I do on the outside of my home?  Regularly fix the edges of walkways and driveways and fix any cracks.  Remove anything that might make you trip as you walk through a door, such as a raised step or threshold.  Trim any bushes or trees on the path to your home.  Use bright outdoor lighting.  Clear any walking paths of anything that might make someone trip, such as rocks or tools.  Regularly check to see if handrails are loose or broken. Make sure that both sides of any steps have handrails.  Any raised decks and porches should have guardrails on the edges.  Have any leaves, snow, or ice cleared regularly.  Use sand or salt on walking paths during winter.  Clean up any spills in your garage right away. This includes oil or grease spills. What can I do in the bathroom?  Use night lights.  Install grab bars by the toilet and in the tub and shower. Do not use towel bars as grab bars.  Use non-skid mats or decals in the tub or shower.  If you need to sit down in the shower, use  a plastic, non-slip stool.  Keep the floor dry. Clean up any water that spills on the floor as soon as it happens.  Remove soap buildup in the tub or shower regularly.  Attach bath mats securely with double-sided non-slip rug tape.  Do not have throw rugs and other things on the floor that can make you trip. What can I do in the bedroom?  Use night lights.  Make sure that you have a light by your bed that is easy to reach.  Do not use any sheets or blankets that are too big for your bed. They should not hang down onto the floor.  Have a firm chair that has side arms. You can use this for support while you get dressed.  Do not have throw rugs and other things on the floor that can make you trip. What can I do in the kitchen?  Clean up any spills right away.  Avoid walking on wet floors.  Keep items that you use a lot in easy-to-reach places.  If you need to reach something above you, use a strong step stool that has a grab bar.  Keep electrical cords out of the way.  Do not use floor polish or wax that makes floors slippery.  If you must use wax, use non-skid floor wax.  Do not have throw rugs and other things on the floor that can make you trip. What can I do with my stairs?  Do not leave any items on the stairs.  Make sure that there are handrails on both sides of the stairs and use them. Fix handrails that are broken or loose. Make sure that handrails are as long as the stairways.  Check any carpeting to make sure that it is firmly attached to the stairs. Fix any carpet that is loose or worn.  Avoid having throw rugs at the top or bottom of the stairs. If you do have throw rugs, attach them to the floor with carpet tape.  Make sure that you have a light switch at the top of the stairs and the bottom of the stairs. If you do not have them, ask someone to add them for you. What else can I do to help prevent falls?  Wear shoes that:  Do not have high heels.  Have  rubber bottoms.  Are comfortable and fit you well.  Are closed at the toe. Do not wear sandals.  If you use a stepladder:  Make sure that it is fully opened. Do not climb a closed stepladder.  Make sure that both sides of the stepladder are locked into place.  Ask someone to hold it for you, if possible.  Clearly mark and make sure that you can see:  Any grab bars or handrails.  First and last steps.  Where the edge of each step is.  Use tools that help you move around (mobility aids) if they are needed. These include:  Canes.  Walkers.  Scooters.  Crutches.  Turn on the lights when you go into a dark area. Replace any light bulbs as soon as they burn out.  Set up your furniture so you have a clear path. Avoid moving your furniture around.  If any of your floors are uneven, fix them.  If there are any pets around you, be aware of where they are.  Review your medicines with your doctor. Some medicines can make you feel dizzy. This can increase your chance of falling. Ask your doctor what other things that you can do to help prevent falls. This information is not intended to replace advice given to you by your health care provider. Make sure you discuss any questions you have with your health care provider. Document Released: 08/25/2009 Document Revised: 04/05/2016 Document Reviewed: 12/03/2014 Elsevier Interactive Patient Education  2017 [employer].

## 2021-04-14 NOTE — Assessment & Plan Note (Signed)
Chronic Blood pressure is on the low side Will discontinue benazepril Monitor BP with palliative care, visiting nurse

## 2021-04-17 DIAGNOSIS — F2081 Schizophreniform disorder: Secondary | ICD-10-CM | POA: Diagnosis not present

## 2021-04-17 DIAGNOSIS — N1832 Chronic kidney disease, stage 3b: Secondary | ICD-10-CM | POA: Diagnosis not present

## 2021-04-17 DIAGNOSIS — L89150 Pressure ulcer of sacral region, unstageable: Secondary | ICD-10-CM | POA: Diagnosis not present

## 2021-04-17 DIAGNOSIS — I7300 Raynaud's syndrome without gangrene: Secondary | ICD-10-CM | POA: Diagnosis not present

## 2021-04-17 DIAGNOSIS — I129 Hypertensive chronic kidney disease with stage 1 through stage 4 chronic kidney disease, or unspecified chronic kidney disease: Secondary | ICD-10-CM | POA: Diagnosis not present

## 2021-04-17 DIAGNOSIS — L89892 Pressure ulcer of other site, stage 2: Secondary | ICD-10-CM | POA: Diagnosis not present

## 2021-04-18 DIAGNOSIS — F2081 Schizophreniform disorder: Secondary | ICD-10-CM | POA: Diagnosis not present

## 2021-04-19 ENCOUNTER — Encounter: Payer: Self-pay | Admitting: Internal Medicine

## 2021-04-19 DIAGNOSIS — I129 Hypertensive chronic kidney disease with stage 1 through stage 4 chronic kidney disease, or unspecified chronic kidney disease: Secondary | ICD-10-CM | POA: Diagnosis not present

## 2021-04-19 DIAGNOSIS — N1832 Chronic kidney disease, stage 3b: Secondary | ICD-10-CM | POA: Diagnosis not present

## 2021-04-19 DIAGNOSIS — F2081 Schizophreniform disorder: Secondary | ICD-10-CM | POA: Diagnosis not present

## 2021-04-19 DIAGNOSIS — I7300 Raynaud's syndrome without gangrene: Secondary | ICD-10-CM | POA: Diagnosis not present

## 2021-04-19 DIAGNOSIS — L89150 Pressure ulcer of sacral region, unstageable: Secondary | ICD-10-CM | POA: Diagnosis not present

## 2021-04-19 DIAGNOSIS — L89892 Pressure ulcer of other site, stage 2: Secondary | ICD-10-CM | POA: Diagnosis not present

## 2021-04-20 ENCOUNTER — Telehealth: Payer: Self-pay | Admitting: Internal Medicine

## 2021-04-20 ENCOUNTER — Other Ambulatory Visit: Payer: Self-pay | Admitting: Internal Medicine

## 2021-04-20 NOTE — Telephone Encounter (Signed)
Gloria Warren is requesting a diagnosis confirmation for the patients sacral wound. Please advise   Phone: [phone] Fax: [phone]

## 2021-04-20 NOTE — Telephone Encounter (Signed)
Yes, can home care do labs ?  - it is too difficult for her to come in.  We can do a cbc, cmp and esr

## 2021-04-20 NOTE — Telephone Encounter (Signed)
Spoke with Claiborne Billings today from [place]. Lab orders given and nurse will draw labs tomorrow when she goes back.  Patient's son informed.

## 2021-04-20 NOTE — Telephone Encounter (Signed)
Spoke with Claiborne Billings today and faxed over recent office note.

## 2021-04-20 NOTE — Telephone Encounter (Signed)
Patients son called and said that the patient has a sacral wound and has received wound care but the patient has a low grade fever and is having bodyaches. He was wondering if Dr. Quay Burow wanted to run lab work to check for infection. Please advise

## 2021-04-21 ENCOUNTER — Telehealth: Payer: Self-pay | Admitting: Internal Medicine

## 2021-04-21 ENCOUNTER — Telehealth: Payer: Self-pay

## 2021-04-21 ENCOUNTER — Telehealth: Payer: Self-pay | Admitting: Student

## 2021-04-21 DIAGNOSIS — I129 Hypertensive chronic kidney disease with stage 1 through stage 4 chronic kidney disease, or unspecified chronic kidney disease: Secondary | ICD-10-CM | POA: Diagnosis not present

## 2021-04-21 DIAGNOSIS — L89892 Pressure ulcer of other site, stage 2: Secondary | ICD-10-CM | POA: Diagnosis not present

## 2021-04-21 DIAGNOSIS — I7300 Raynaud's syndrome without gangrene: Secondary | ICD-10-CM | POA: Diagnosis not present

## 2021-04-21 DIAGNOSIS — F2081 Schizophreniform disorder: Secondary | ICD-10-CM | POA: Diagnosis not present

## 2021-04-21 DIAGNOSIS — N1832 Chronic kidney disease, stage 3b: Secondary | ICD-10-CM | POA: Diagnosis not present

## 2021-04-21 DIAGNOSIS — L89150 Pressure ulcer of sacral region, unstageable: Secondary | ICD-10-CM | POA: Diagnosis not present

## 2021-04-21 NOTE — Telephone Encounter (Signed)
Spoke with [place] today.

## 2021-04-21 NOTE — Telephone Encounter (Signed)
Gloria Warren from [facility] called   States the patient has been running a fever and has started to aspirate. Patient's son is requesting a transition to hospice care.   [place] Name: [facility] Agency Name: Palliative  Callback Phone #: [phone] okay to leave VM  Service Requested: Hospice (examples: OT/PT/Skilled Nursing/Social Work/Speech Therapy/Wound Care)  Frequency of Visits: transition to hospice   Stated the patient has been running a fever and has started to aspirate. Patient's son is requesting a transition to hospice care.

## 2021-04-21 NOTE — Telephone Encounter (Signed)
  Gloria Warren from [place] calling to report she was unable to draw any blood from patient. They have tried several times and cant get any blood, She states patient is dehydrated

## 2021-04-21 NOTE — Telephone Encounter (Signed)
Palliative NP spoke with son [name]. Patient's pain is 10/10. He is in agreement with morphine. Script sent to pharmacy.

## 2021-04-21 NOTE — Telephone Encounter (Signed)
Phone call placed to Dr. Quay Burow office to provide update and request order for hospice evaluation. Verbal order received and MD confirmed she will remain attending. Hospice referral center updated

## 2021-04-21 NOTE — Telephone Encounter (Signed)
Received message from patient's son, [name], who stated patient is having pain, is febrile and continues to aspirate. [name] would like direction on whether to send patient to hospital or pursue hospice. Discussed this with [name] who opted to pursue Hospice services for patient.

## 2021-04-21 NOTE — Telephone Encounter (Signed)
Error

## 2021-04-21 NOTE — Telephone Encounter (Signed)
I agree with hospice

## 2021-04-21 NOTE — Telephone Encounter (Signed)
See other note

## 2021-04-23 DIAGNOSIS — E43 Unspecified severe protein-calorie malnutrition: Secondary | ICD-10-CM | POA: Diagnosis not present

## 2021-04-23 DIAGNOSIS — J302 Other seasonal allergic rhinitis: Secondary | ICD-10-CM | POA: Diagnosis not present

## 2021-04-23 DIAGNOSIS — F32A Depression, unspecified: Secondary | ICD-10-CM | POA: Diagnosis not present

## 2021-04-23 DIAGNOSIS — I10 Essential (primary) hypertension: Secondary | ICD-10-CM | POA: Diagnosis not present

## 2021-04-23 DIAGNOSIS — F2081 Schizophreniform disorder: Secondary | ICD-10-CM | POA: Diagnosis not present

## 2021-04-23 DIAGNOSIS — Z86718 Personal history of other venous thrombosis and embolism: Secondary | ICD-10-CM | POA: Diagnosis not present

## 2021-04-23 DIAGNOSIS — L89152 Pressure ulcer of sacral region, stage 2: Secondary | ICD-10-CM | POA: Diagnosis not present

## 2021-04-23 DIAGNOSIS — E785 Hyperlipidemia, unspecified: Secondary | ICD-10-CM | POA: Diagnosis not present

## 2021-04-23 DIAGNOSIS — K219 Gastro-esophageal reflux disease without esophagitis: Secondary | ICD-10-CM | POA: Diagnosis not present

## 2021-04-23 DIAGNOSIS — M069 Rheumatoid arthritis, unspecified: Secondary | ICD-10-CM | POA: Diagnosis not present

## 2021-04-23 DIAGNOSIS — D500 Iron deficiency anemia secondary to blood loss (chronic): Secondary | ICD-10-CM | POA: Diagnosis not present

## 2021-04-23 DIAGNOSIS — K626 Ulcer of anus and rectum: Secondary | ICD-10-CM | POA: Diagnosis not present

## 2021-04-23 DIAGNOSIS — Z85828 Personal history of other malignant neoplasm of skin: Secondary | ICD-10-CM | POA: Diagnosis not present

## 2021-04-23 DIAGNOSIS — F0391 Unspecified dementia with behavioral disturbance: Secondary | ICD-10-CM | POA: Diagnosis not present

## 2021-04-23 DIAGNOSIS — R1310 Dysphagia, unspecified: Secondary | ICD-10-CM | POA: Diagnosis not present

## 2021-04-24 DIAGNOSIS — R1310 Dysphagia, unspecified: Secondary | ICD-10-CM | POA: Diagnosis not present

## 2021-04-24 DIAGNOSIS — M069 Rheumatoid arthritis, unspecified: Secondary | ICD-10-CM | POA: Diagnosis not present

## 2021-04-24 DIAGNOSIS — I10 Essential (primary) hypertension: Secondary | ICD-10-CM | POA: Diagnosis not present

## 2021-04-24 DIAGNOSIS — E43 Unspecified severe protein-calorie malnutrition: Secondary | ICD-10-CM | POA: Diagnosis not present

## 2021-04-24 DIAGNOSIS — L89152 Pressure ulcer of sacral region, stage 2: Secondary | ICD-10-CM | POA: Diagnosis not present

## 2021-04-24 DIAGNOSIS — F0391 Unspecified dementia with behavioral disturbance: Secondary | ICD-10-CM | POA: Diagnosis not present

## 2021-04-25 DIAGNOSIS — M069 Rheumatoid arthritis, unspecified: Secondary | ICD-10-CM | POA: Diagnosis not present

## 2021-04-25 DIAGNOSIS — R1310 Dysphagia, unspecified: Secondary | ICD-10-CM | POA: Diagnosis not present

## 2021-04-25 DIAGNOSIS — L89152 Pressure ulcer of sacral region, stage 2: Secondary | ICD-10-CM | POA: Diagnosis not present

## 2021-04-25 DIAGNOSIS — F0391 Unspecified dementia with behavioral disturbance: Secondary | ICD-10-CM | POA: Diagnosis not present

## 2021-04-25 DIAGNOSIS — E43 Unspecified severe protein-calorie malnutrition: Secondary | ICD-10-CM | POA: Diagnosis not present

## 2021-04-25 DIAGNOSIS — I10 Essential (primary) hypertension: Secondary | ICD-10-CM | POA: Diagnosis not present

## 2021-04-26 DIAGNOSIS — R1310 Dysphagia, unspecified: Secondary | ICD-10-CM | POA: Diagnosis not present

## 2021-04-26 DIAGNOSIS — M069 Rheumatoid arthritis, unspecified: Secondary | ICD-10-CM | POA: Diagnosis not present

## 2021-04-26 DIAGNOSIS — E43 Unspecified severe protein-calorie malnutrition: Secondary | ICD-10-CM | POA: Diagnosis not present

## 2021-04-26 DIAGNOSIS — I10 Essential (primary) hypertension: Secondary | ICD-10-CM | POA: Diagnosis not present

## 2021-04-26 DIAGNOSIS — F0391 Unspecified dementia with behavioral disturbance: Secondary | ICD-10-CM | POA: Diagnosis not present

## 2021-04-26 DIAGNOSIS — L89152 Pressure ulcer of sacral region, stage 2: Secondary | ICD-10-CM | POA: Diagnosis not present

## 2021-04-28 ENCOUNTER — Other Ambulatory Visit: Payer: Self-pay | Admitting: Internal Medicine

## 2021-05-12 ENCOUNTER — Telehealth: Payer: Self-pay | Admitting: *Deleted

## 2021-05-12 NOTE — Telephone Encounter (Signed)
Faxed signed orders to [facility]. Confirmation rec'd OK.

## 2021-05-12 DEATH — deceased
